# Patient Record
Sex: Male | Born: 1965 | Race: White | Hispanic: No | Smoking: Never smoker
Health system: Southern US, Community
[De-identification: ages and names within clinical notes are randomized; demographics above are authoritative.]

## PROBLEM LIST (undated history)

## (undated) DIAGNOSIS — I10 Essential (primary) hypertension: Secondary | ICD-10-CM

## (undated) DIAGNOSIS — E222 Syndrome of inappropriate secretion of antidiuretic hormone: Secondary | ICD-10-CM

## (undated) DIAGNOSIS — E871 Hypo-osmolality and hyponatremia: Secondary | ICD-10-CM

## (undated) DIAGNOSIS — F32A Depression, unspecified: Secondary | ICD-10-CM

## (undated) DIAGNOSIS — I2510 Atherosclerotic heart disease of native coronary artery without angina pectoris: Secondary | ICD-10-CM

## (undated) DIAGNOSIS — I2699 Other pulmonary embolism without acute cor pulmonale: Secondary | ICD-10-CM

## (undated) DIAGNOSIS — R51 Headache: Secondary | ICD-10-CM

## (undated) DIAGNOSIS — G2581 Restless legs syndrome: Secondary | ICD-10-CM

## (undated) DIAGNOSIS — R569 Unspecified convulsions: Secondary | ICD-10-CM

## (undated) DIAGNOSIS — F419 Anxiety disorder, unspecified: Secondary | ICD-10-CM

## (undated) DIAGNOSIS — Q030 Malformations of aqueduct of Sylvius: Secondary | ICD-10-CM

## (undated) DIAGNOSIS — G935 Compression of brain: Secondary | ICD-10-CM

## (undated) DIAGNOSIS — G5600 Carpal tunnel syndrome, unspecified upper limb: Secondary | ICD-10-CM

## (undated) DIAGNOSIS — N200 Calculus of kidney: Secondary | ICD-10-CM

## (undated) DIAGNOSIS — F329 Major depressive disorder, single episode, unspecified: Secondary | ICD-10-CM

## (undated) DIAGNOSIS — G4733 Obstructive sleep apnea (adult) (pediatric): Secondary | ICD-10-CM

## (undated) DIAGNOSIS — I4891 Unspecified atrial fibrillation: Secondary | ICD-10-CM

## (undated) DIAGNOSIS — R55 Syncope and collapse: Secondary | ICD-10-CM

## (undated) DIAGNOSIS — R413 Other amnesia: Secondary | ICD-10-CM

## (undated) DIAGNOSIS — G919 Hydrocephalus, unspecified: Secondary | ICD-10-CM

## (undated) HISTORY — DX: Obstructive sleep apnea (adult) (pediatric): G47.33

## (undated) HISTORY — DX: Anxiety disorder, unspecified: F41.9

## (undated) HISTORY — DX: Compression of brain: G93.5

## (undated) HISTORY — DX: Unspecified convulsions: R56.9

## (undated) HISTORY — DX: Hypo-osmolality and hyponatremia: E87.1

## (undated) HISTORY — DX: Unspecified atrial fibrillation: I48.91

## (undated) HISTORY — DX: Depression, unspecified: F32.A

## (undated) HISTORY — DX: Other pulmonary embolism without acute cor pulmonale: I26.99

## (undated) HISTORY — PX: CHOLECYSTECTOMY: SHX55

## (undated) HISTORY — DX: Hydrocephalus, unspecified: G91.9

## (undated) HISTORY — DX: Headache: R51

## (undated) HISTORY — DX: Essential (primary) hypertension: I10

## (undated) HISTORY — DX: Major depressive disorder, single episode, unspecified: F32.9

## (undated) HISTORY — PX: OTHER SURGICAL HISTORY: SHX169

## (undated) HISTORY — PX: PROSTATE SURGERY: SHX751

## (undated) HISTORY — PX: VENTRICULOPERITONEAL SHUNT: SHX204

## (undated) HISTORY — DX: Malformations of aqueduct of Sylvius: Q03.0

## (undated) HISTORY — DX: Restless legs syndrome: G25.81

## (undated) HISTORY — DX: Syncope and collapse: R55

## (undated) HISTORY — DX: Carpal tunnel syndrome, unspecified upper limb: G56.00

## (undated) HISTORY — DX: Other amnesia: R41.3

## (undated) HISTORY — DX: Syndrome of inappropriate secretion of antidiuretic hormone: E22.2

## (undated) HISTORY — DX: Calculus of kidney: N20.0

---

## 2006-08-17 ENCOUNTER — Encounter: Admission: RE | Admit: 2006-08-17 | Discharge: 2006-08-17 | Payer: Self-pay | Admitting: Neurology

## 2006-12-04 ENCOUNTER — Inpatient Hospital Stay (HOSPITAL_COMMUNITY): Admission: RE | Admit: 2006-12-04 | Discharge: 2006-12-14 | Payer: Self-pay | Admitting: Neurosurgery

## 2006-12-04 ENCOUNTER — Ambulatory Visit: Payer: Self-pay | Admitting: Critical Care Medicine

## 2006-12-10 ENCOUNTER — Ambulatory Visit: Payer: Self-pay | Admitting: *Deleted

## 2006-12-10 ENCOUNTER — Encounter: Payer: Self-pay | Admitting: Critical Care Medicine

## 2006-12-14 ENCOUNTER — Encounter (INDEPENDENT_AMBULATORY_CARE_PROVIDER_SITE_OTHER): Payer: Self-pay | Admitting: Neurosurgery

## 2006-12-30 ENCOUNTER — Inpatient Hospital Stay (HOSPITAL_COMMUNITY): Admission: EM | Admit: 2006-12-30 | Discharge: 2007-01-23 | Payer: Self-pay | Admitting: Emergency Medicine

## 2006-12-31 ENCOUNTER — Ambulatory Visit: Payer: Self-pay | Admitting: Infectious Disease

## 2007-02-04 ENCOUNTER — Ambulatory Visit (HOSPITAL_COMMUNITY): Admission: RE | Admit: 2007-02-04 | Discharge: 2007-02-04 | Payer: Self-pay | Admitting: Endocrinology

## 2007-03-07 ENCOUNTER — Encounter: Admission: RE | Admit: 2007-03-07 | Discharge: 2007-03-07 | Payer: Self-pay | Admitting: Neurosurgery

## 2007-04-25 ENCOUNTER — Encounter: Admission: RE | Admit: 2007-04-25 | Discharge: 2007-04-25 | Payer: Self-pay | Admitting: Neurosurgery

## 2007-07-02 ENCOUNTER — Emergency Department (HOSPITAL_COMMUNITY): Admission: EM | Admit: 2007-07-02 | Discharge: 2007-07-02 | Payer: Self-pay | Admitting: Emergency Medicine

## 2007-10-21 ENCOUNTER — Emergency Department (HOSPITAL_COMMUNITY): Admission: EM | Admit: 2007-10-21 | Discharge: 2007-10-21 | Payer: Self-pay | Admitting: Emergency Medicine

## 2008-02-14 ENCOUNTER — Encounter: Admission: RE | Admit: 2008-02-14 | Discharge: 2008-02-14 | Payer: Self-pay | Admitting: Neurology

## 2008-07-14 ENCOUNTER — Ambulatory Visit: Payer: Self-pay | Admitting: Cardiology

## 2008-07-14 ENCOUNTER — Inpatient Hospital Stay (HOSPITAL_COMMUNITY): Admission: AD | Admit: 2008-07-14 | Discharge: 2008-07-16 | Payer: Self-pay | Admitting: Internal Medicine

## 2008-07-14 ENCOUNTER — Encounter (INDEPENDENT_AMBULATORY_CARE_PROVIDER_SITE_OTHER): Payer: Self-pay | Admitting: Internal Medicine

## 2008-07-15 ENCOUNTER — Encounter: Payer: Self-pay | Admitting: Internal Medicine

## 2008-07-15 ENCOUNTER — Encounter (INDEPENDENT_AMBULATORY_CARE_PROVIDER_SITE_OTHER): Payer: Self-pay | Admitting: Internal Medicine

## 2008-09-02 ENCOUNTER — Ambulatory Visit (HOSPITAL_COMMUNITY): Admission: RE | Admit: 2008-09-02 | Discharge: 2008-09-02 | Payer: Self-pay | Admitting: Neurosurgery

## 2008-11-01 ENCOUNTER — Inpatient Hospital Stay (HOSPITAL_COMMUNITY): Admission: EM | Admit: 2008-11-01 | Discharge: 2008-11-05 | Payer: Self-pay | Admitting: Emergency Medicine

## 2008-11-01 ENCOUNTER — Ambulatory Visit: Payer: Self-pay | Admitting: Internal Medicine

## 2008-11-04 ENCOUNTER — Encounter: Payer: Self-pay | Admitting: Internal Medicine

## 2008-11-09 ENCOUNTER — Encounter: Payer: Self-pay | Admitting: Internal Medicine

## 2008-11-17 ENCOUNTER — Telehealth: Payer: Self-pay | Admitting: Internal Medicine

## 2008-11-17 ENCOUNTER — Emergency Department (HOSPITAL_COMMUNITY): Admission: EM | Admit: 2008-11-17 | Discharge: 2008-11-17 | Payer: Self-pay | Admitting: Emergency Medicine

## 2008-11-17 ENCOUNTER — Encounter: Payer: Self-pay | Admitting: Internal Medicine

## 2008-11-18 ENCOUNTER — Telehealth: Payer: Self-pay | Admitting: Internal Medicine

## 2008-11-19 ENCOUNTER — Ambulatory Visit: Payer: Self-pay

## 2008-11-19 ENCOUNTER — Encounter: Payer: Self-pay | Admitting: Internal Medicine

## 2009-02-22 ENCOUNTER — Ambulatory Visit: Payer: Self-pay | Admitting: Internal Medicine

## 2009-04-29 ENCOUNTER — Ambulatory Visit: Payer: Self-pay | Admitting: Internal Medicine

## 2009-04-29 DIAGNOSIS — R42 Dizziness and giddiness: Secondary | ICD-10-CM | POA: Insufficient documentation

## 2009-04-29 DIAGNOSIS — R55 Syncope and collapse: Secondary | ICD-10-CM

## 2009-04-29 DIAGNOSIS — G9001 Carotid sinus syncope: Secondary | ICD-10-CM

## 2009-04-29 DIAGNOSIS — I4891 Unspecified atrial fibrillation: Secondary | ICD-10-CM

## 2009-04-29 DIAGNOSIS — Q078 Other specified congenital malformations of nervous system: Secondary | ICD-10-CM

## 2009-05-12 IMAGING — CR DG CHEST 1V PORT
1 series · 1 of 1 positions shown · non-contrast
Comparison: [DATE]/airway at 12: 12 hours

CLINICAL DATA: Hydrocephalus. Line placement.

PORTABLE CHEST - 1 VIEW

[view not recorded]
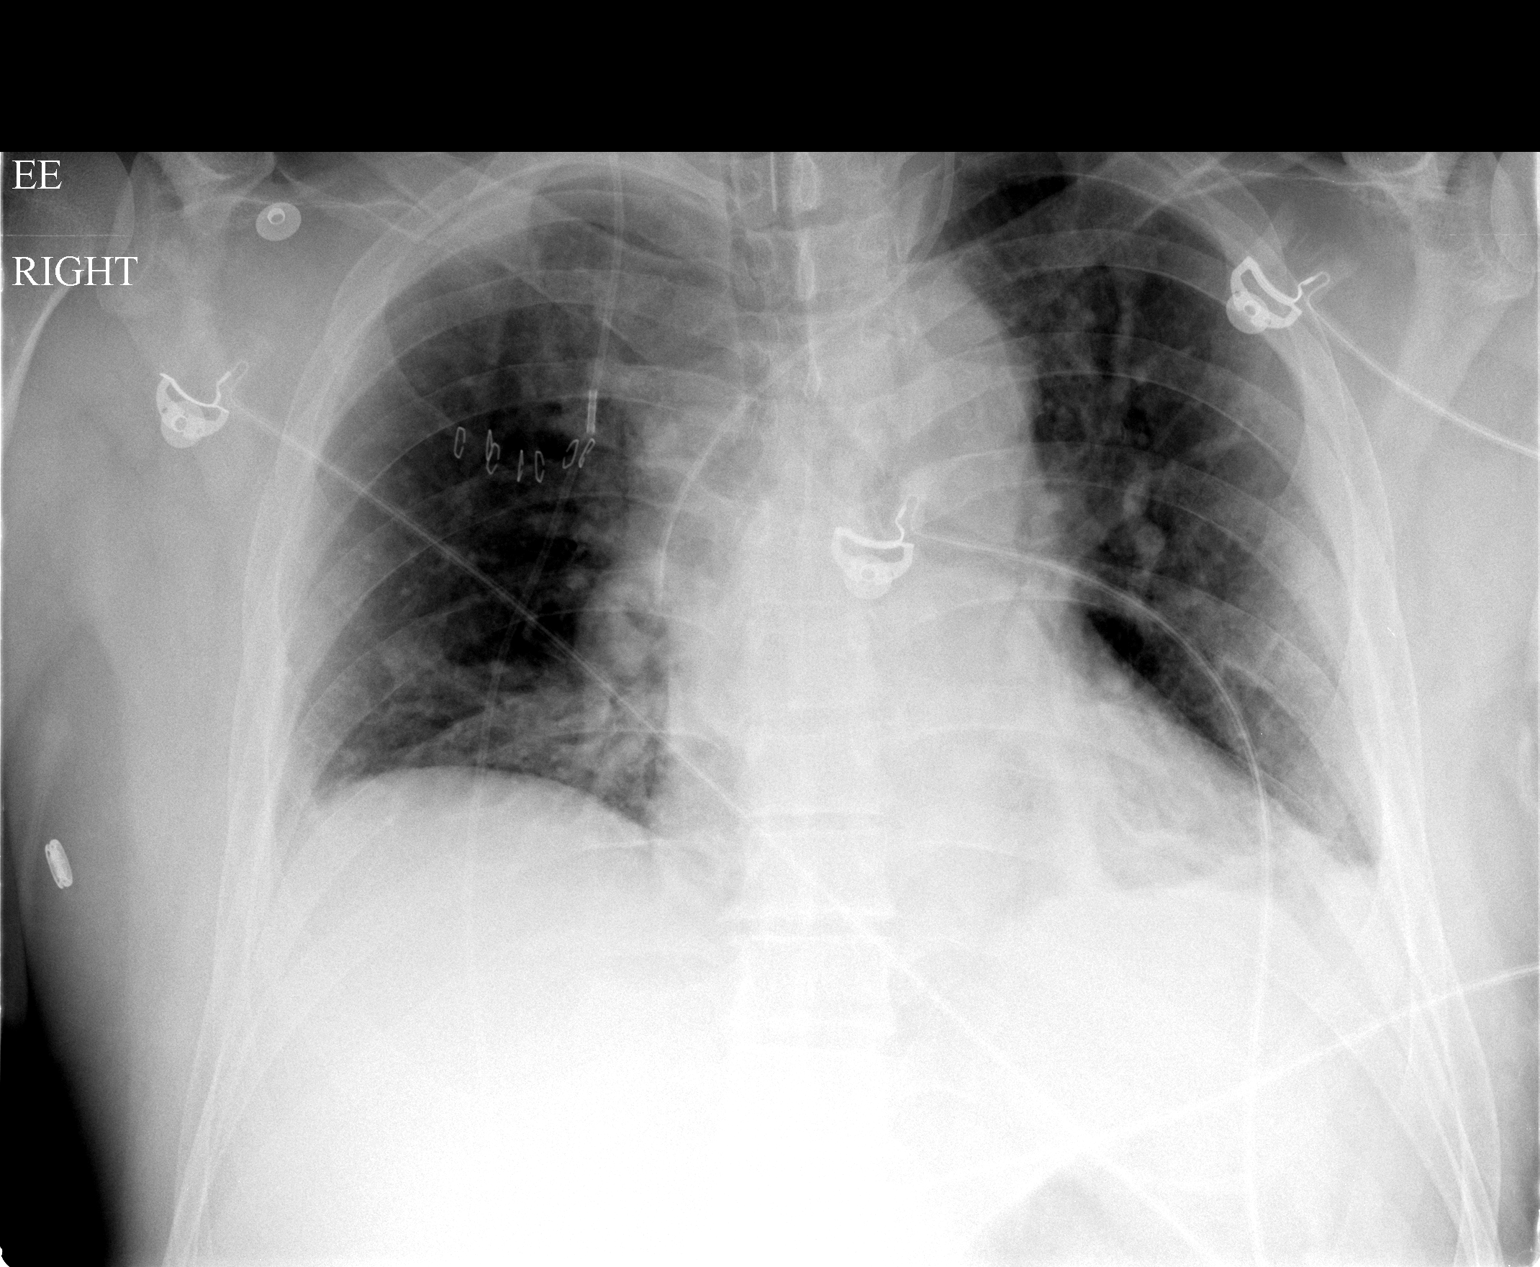

[1 of 1 positions shown; findings below may reference images not displayed]

FINDINGS: Left internal jugular central venous catheter tip projects into the
superior vena cava. No pneumothorax identified. New skin clips project over the
right hemithorax, and a vertical metallic structure projecting over the right
ventricular peritoneal shunt tubing may represent an adapter.

Interstitial opacities stable bilaterally. Low lung volumes are present. There
are stable airspace opacity of left lung base. Endotracheal tube appear stable. 

IMPRESSION

1. Left IJ line tip: SVC. No pneumothorax.
2. Small adapter projects along the right thoracic VP shunt tubing.
3. The lungs appear stable.

## 2009-05-12 IMAGING — CR DG ABD PORTABLE 1V
1 series · 1 of 1 positions shown · non-contrast
Comparison: none

CLINICAL DATA: Distended abdomen status post ventricular peritoneal shunt. 
PORTABLE ABDOMEN - 1 VIEW - 12/07/06 AT 9338 HOURS:

[view not recorded]
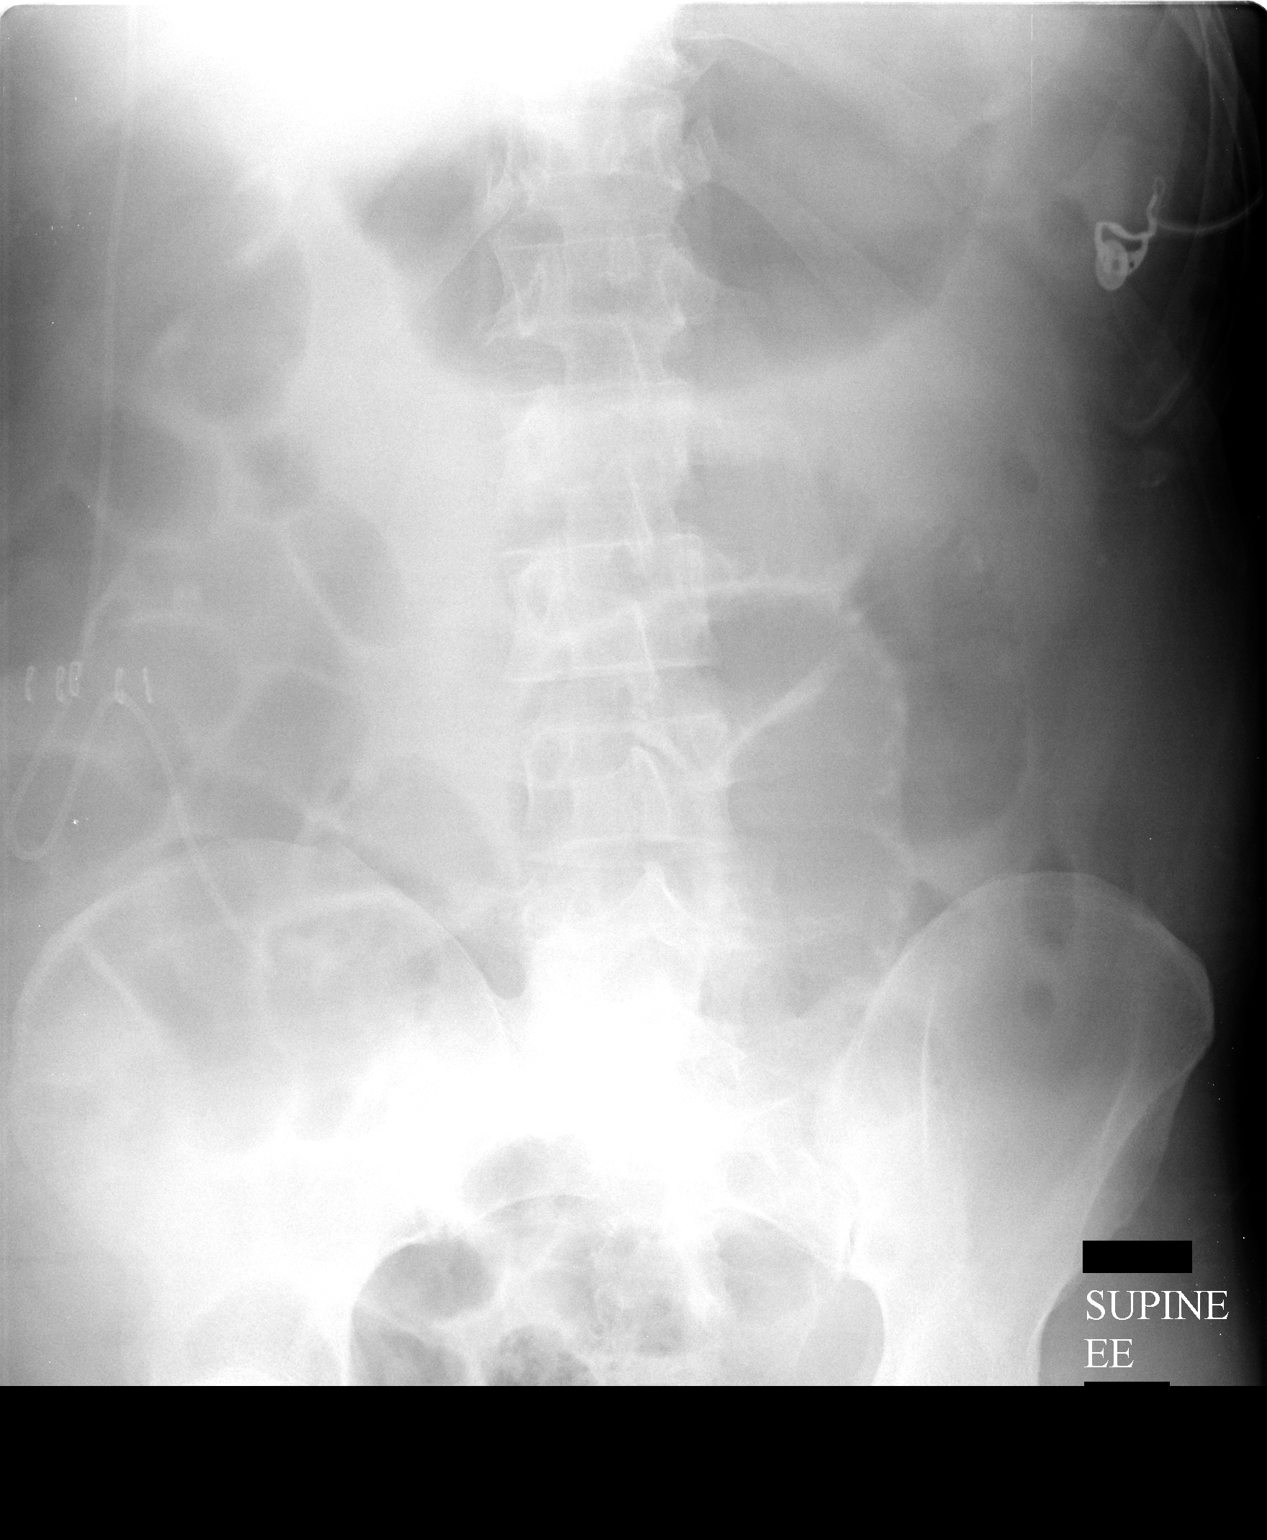

[1 of 1 positions shown; findings below may reference images not displayed]

FINDINGS: There is mild gaseous distention of large and small bowel compatible with ileus.  entricular peritoneal shunt tubing is seen overlying the right abdomen extending to the tip of the catheter overlying the right iliac bone.  No acute bony abnormality.
IMPRESSION: Mild ileus.

## 2009-05-15 IMAGING — CT CT HEAD W/O CM
1 series · 15 of 30 positions shown, 19 images · IV contrast (agent unspecified)
Comparison: 12/07/06 and earlier.

CLINICAL DATA: 40 year-old-male with balance and gait difficulties.  Right ventriculoperitoneal shunt placement due to obstructive hydrocephalus.  
 HEAD CT WITHOUT CONTRAST:
TECHNIQUE: Contiguous axial images were obtained from the base of the skull through the vertex according to standard protocol without contrast.

[Series 2: head routine 4.8 h47s · axial · 0.51mm/px · z∈[-90,+47]mm · 15 of 30 slices shown, 19 images]
[im 2/30  brain]
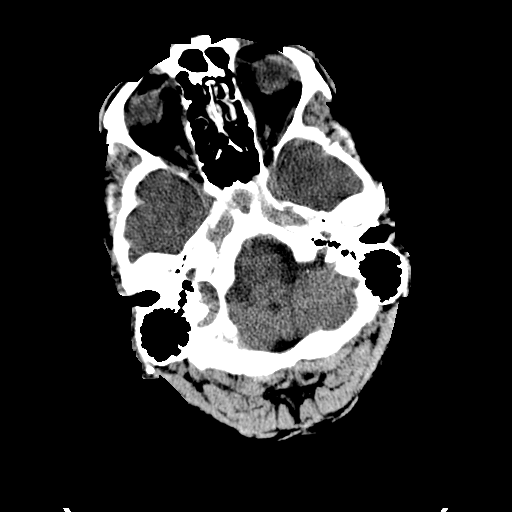
[im 2/30  bone]
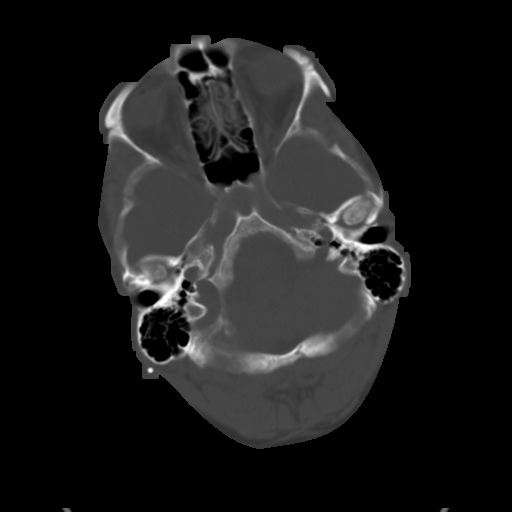
[im 4/30  brain]
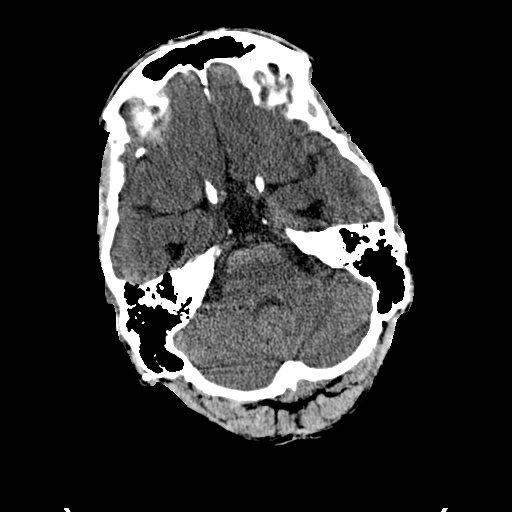
[im 6/30  brain]
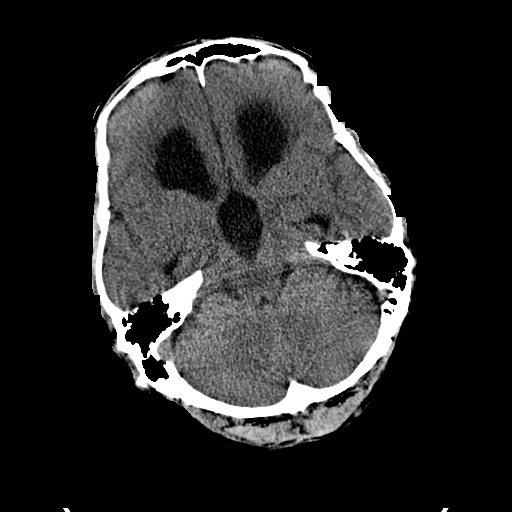
[im 8/30  brain]
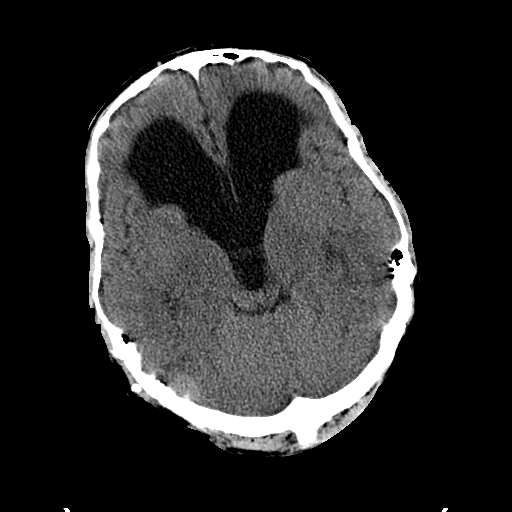
[im 10/30  brain]
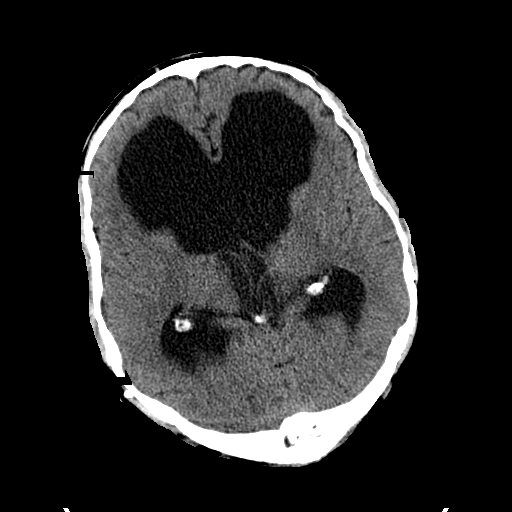
[im 10/30  bone]
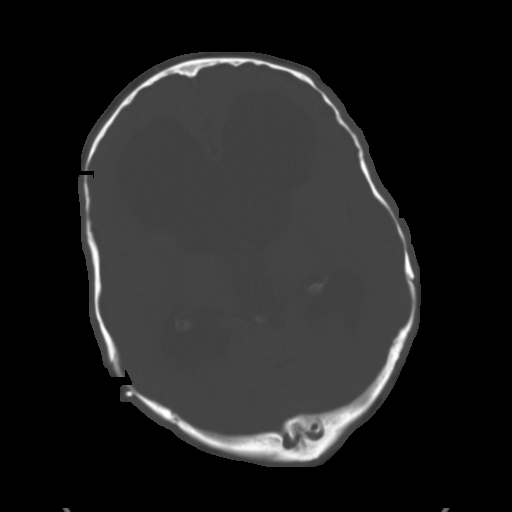
[im 12/30  brain]
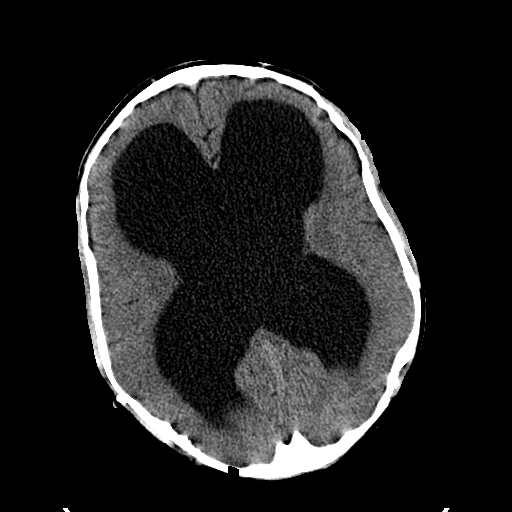
[im 14/30  brain]
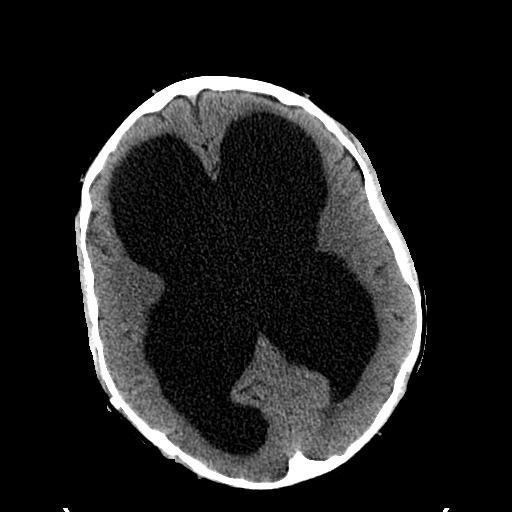
[im 16/30  brain]
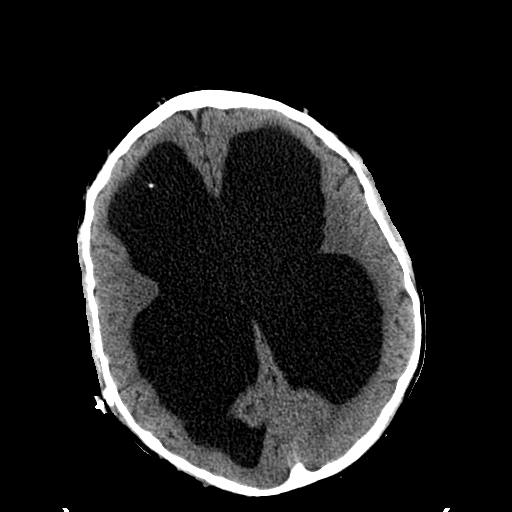
[im 17/30  brain]
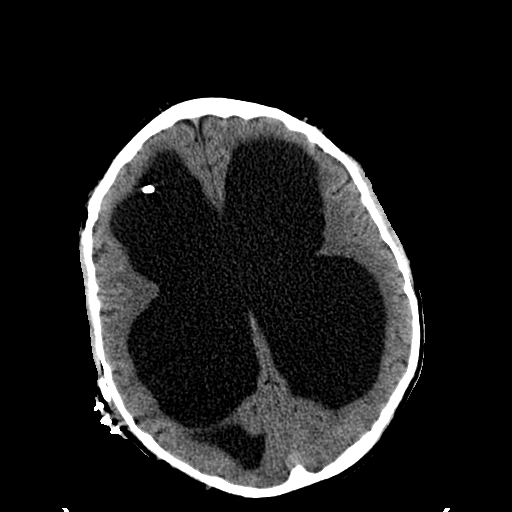
[im 17/30  bone]
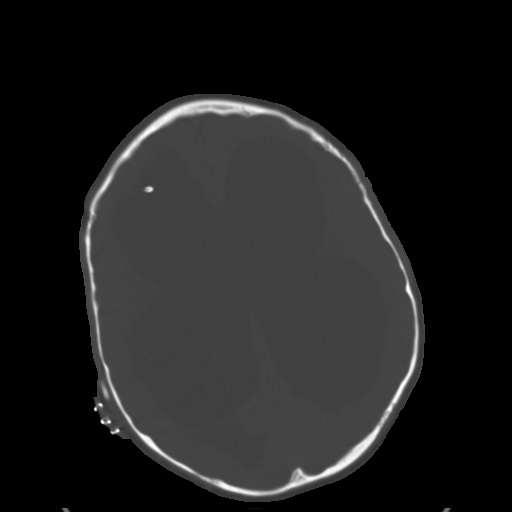
[im 19/30  brain]
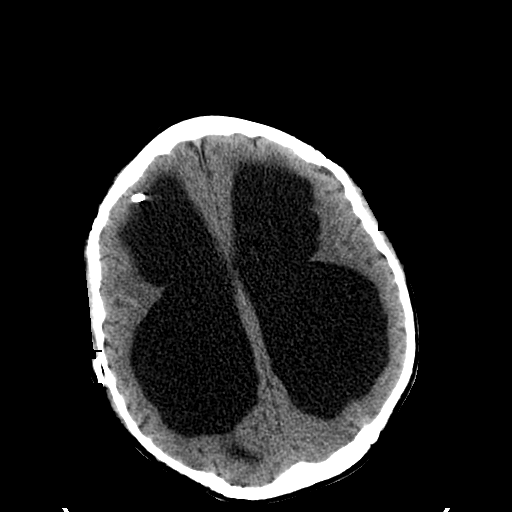
[im 21/30  brain]
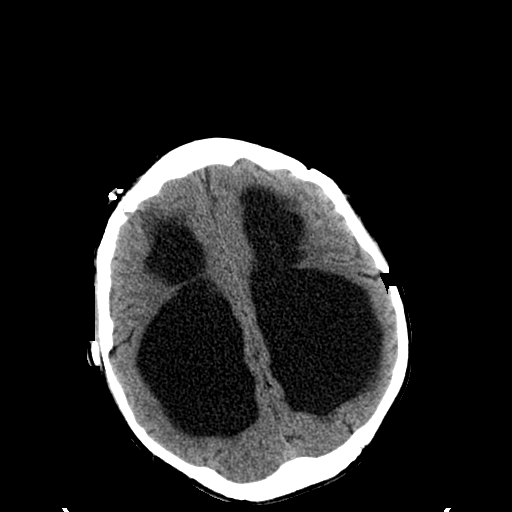
[im 23/30  brain]
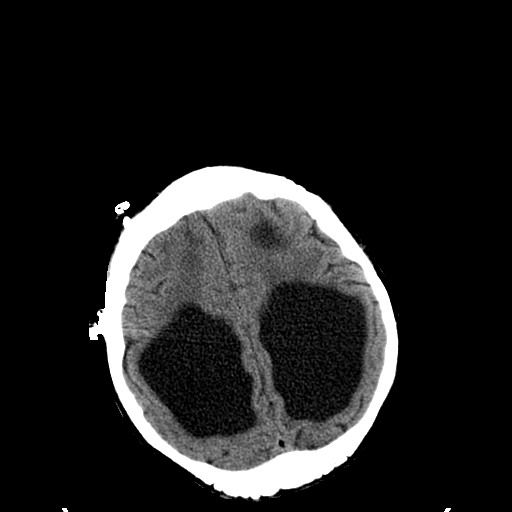
[im 25/30  brain]
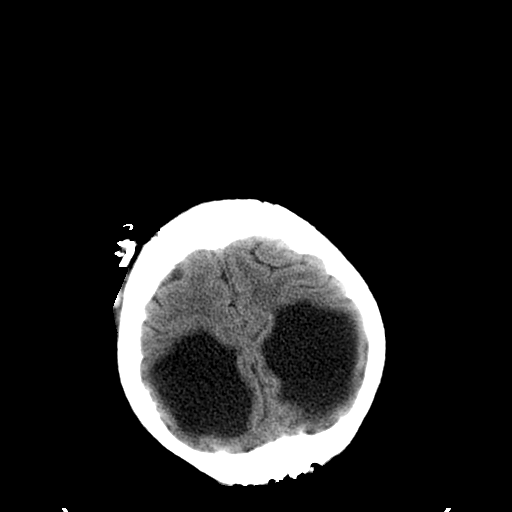
[im 25/30  bone]
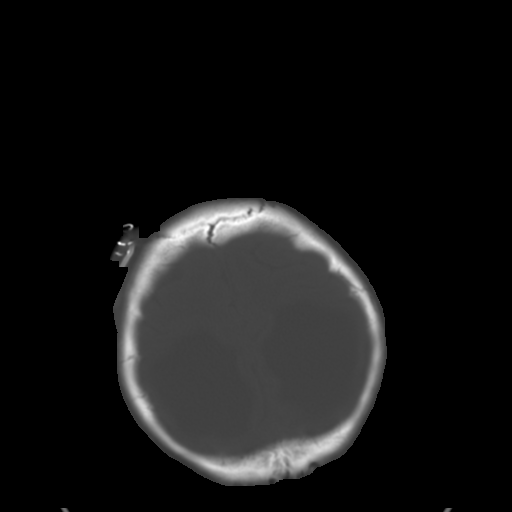
[im 27/30  brain]
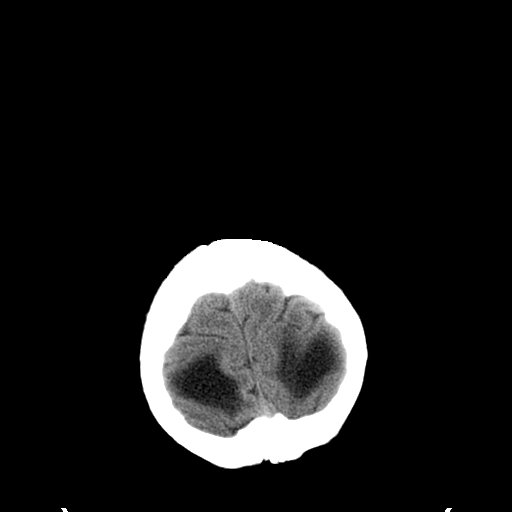
[im 29/30  brain]
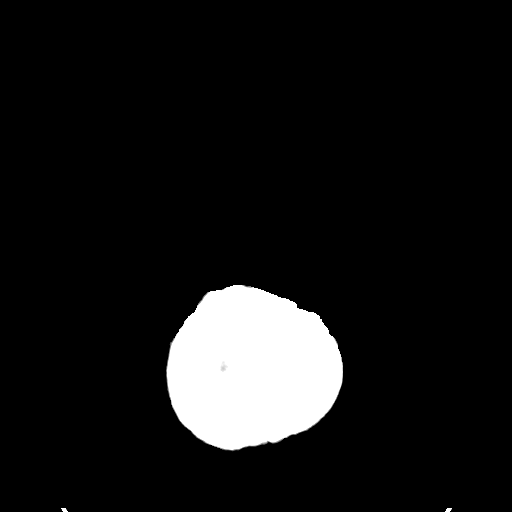

[15 of 30 positions shown; findings below may reference images not displayed]

FINDINGS: Marked enlargement of the lateral and 3rd ventricles relative to the 4th ventricle is reidentified.  A ventriculostomy catheter is reidentified extending from the right frontal approach into the right anterior horn.   There is decreased gas within the ventricles.  Overall ventricle size and configuration are stable.  No midline shift or acute intracranial hemorrhage.  Basilar cisterns are patent.  Stable gray/white matter differentiation.  Stable crowding of the foramen magnum.  Skin staples of the right frontal scalp remain in place.   The visualized orbits are normal.   No acute osseous, mastoid or paranasal sinus abnormality.
IMPRESSION: 1.  Stable lateral and 3rd ventriculomegaly.  Stable right frontal ventriculostomy shunt.  
 2.  No acute intracranial abnormality.

## 2009-05-16 ENCOUNTER — Emergency Department (HOSPITAL_COMMUNITY): Admission: EM | Admit: 2009-05-16 | Discharge: 2009-05-16 | Payer: Self-pay | Admitting: Emergency Medicine

## 2009-07-12 ENCOUNTER — Ambulatory Visit: Payer: Self-pay | Admitting: Internal Medicine

## 2009-08-18 ENCOUNTER — Encounter: Admission: RE | Admit: 2009-08-18 | Discharge: 2009-08-18 | Payer: Self-pay | Admitting: Neurology

## 2009-08-23 ENCOUNTER — Encounter: Admission: RE | Admit: 2009-08-23 | Discharge: 2009-08-23 | Payer: Self-pay | Admitting: Neurology

## 2009-09-17 ENCOUNTER — Encounter: Payer: Self-pay | Admitting: Internal Medicine

## 2009-09-17 ENCOUNTER — Ambulatory Visit: Payer: Self-pay | Admitting: Cardiovascular Disease

## 2009-11-05 ENCOUNTER — Encounter: Admission: RE | Admit: 2009-11-05 | Discharge: 2009-11-05 | Payer: Self-pay | Admitting: Neurosurgery

## 2009-11-11 ENCOUNTER — Ambulatory Visit (HOSPITAL_COMMUNITY): Admission: RE | Admit: 2009-11-11 | Discharge: 2009-11-11 | Payer: Self-pay | Admitting: Neurosurgery

## 2009-11-22 ENCOUNTER — Encounter: Admission: RE | Admit: 2009-11-22 | Discharge: 2009-11-22 | Payer: Self-pay | Admitting: Neurosurgery

## 2009-11-26 ENCOUNTER — Inpatient Hospital Stay (HOSPITAL_COMMUNITY): Admission: RE | Admit: 2009-11-26 | Discharge: 2009-11-27 | Payer: Self-pay | Admitting: Neurosurgery

## 2009-12-10 ENCOUNTER — Encounter: Payer: Self-pay | Admitting: Internal Medicine

## 2009-12-13 ENCOUNTER — Encounter: Admission: RE | Admit: 2009-12-13 | Discharge: 2009-12-13 | Payer: Self-pay | Admitting: Neurosurgery

## 2010-02-18 ENCOUNTER — Encounter
Admission: RE | Admit: 2010-02-18 | Discharge: 2010-02-18 | Payer: Self-pay | Source: Home / Self Care | Attending: Neurosurgery | Admitting: Neurosurgery

## 2010-02-23 NOTE — Letter (Signed)
Summary: Return To Work  [name]  [address]   [place], [place] [address]   Phone: [phone]  Fax: [phone]    09/17/2009  TO: Leodis Sias IT MAY CONCERN   RE: [name] [address] [id number]   The above named individual is under my medical care and may return to work on:09/18/2009.  He is not to climb any ladders or work at any high levels.  If you have any further questions or need additional information, please call.     Sincerely,  Altha Harm, LPN  September 17, 2009 3:17 PM   Duke Salvia, MD,FACC

## 2010-02-23 NOTE — Procedures (Signed)
Summary: [facility]iology Device [facility]   Allergies: 1)  ! Prednisone   ILR Following MD Sherryl Manges, MD DOI:  11/04/2008 Vendor:  St Jude     Model Number:  [id number]     Serial Number [id number]       Tachy Episodes:  1     Brady Episodes:  1  Tech Comments:  All episodes were either noise or undersensing.  EGM dynamic range reprogrammed 0.4mV and sensitivity reprogrammed 0.6mV.  ROV 3 months [facility]. Altha Harm, LPN  July 12, 2009 11:19 AM

## 2010-02-23 NOTE — Procedures (Signed)
Summary: Cardiology Device Clinic   Allergies: 1)  ! Prednisone   ILR Following MD Sherryl Manges, MD DOI:  11/04/2008 Vendor:  [employer] Jude     Model Number:  [id number]     Serial Number [id number]       Tachy Episodes:  1     Brady Episodes:  1  Tech Comments:  All episodes either noise or undersensing including the 63 asystole episodes, all of these episodes were from 8/18-8/26.     He did have a syncopal episode on 08/23/2009 but did not acitivate the device to record. Altha Harm, LPN  September 17, 2009 11:34 AM

## 2010-02-23 NOTE — Procedures (Signed)
Summary: Holter and Event  Holter and Event   Imported By: Erle Crocker 12/10/2009 16:05:17  _____________________________________________________________________  External Attachment:    Type:   Image     Comment:   External Document

## 2010-02-23 NOTE — Procedures (Signed)
Summary: Cardiology Device Clinic    ILR Following MD Sherryl Manges, MD DOI:  11/04/2008 Vendor:  St Jude     Model Number:  [id number]     Serial Number [id number]       ILR Next Due 04/23/2009  Tech Comments:  Recorded episodes are undersensing and noise.   The patient has been asymptomatic.  ROV 3 months. Altha Harm, LPN  February 22, 2009 3:01 PM

## 2010-02-23 NOTE — Assessment & Plan Note (Signed)
Summary: rov   CC:  rov/Tuesday he felt very weak and has been nauseated since Sunday.  SOB reported with the nausea.  Pt states that he has also had some headaches this week.  .  History of Present Illness: 1. 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 2. Status post loop recorder for the above.   [name] is seen in followup for syncope that is thought to be neurally mediated. He has a history of paroxysmal atrial fibrillation. He is status post loop recorder implantation.  He is activated his monitor on one occasion for palpitations. This demonstrated atrial fibrillation with a rapid ventricular response.  He comes in today however as an add-on, because over the weekend he began having problems with nausea. On Tuesday he became increasingly weak lightheaded and presyncopal. His nausea persists but is largely abated. As lightheadedness and weakness is also improved. I should note that he is status post cholecystectomy.  Because of his hydrocephalus, he has very poor short-term memory.   Current Medications (verified): 1)  Androgel Pump 1 % Gel (Testosterone) .... Uad 2)  Sodium Chloride 1 Gm Tabs (Sodium Chloride) .... Take 3 Tablets Three Times A Day 3)  Fludrocortisone Acetate 0.1 Mg Tabs (Fludrocortisone Acetate) .... 2 Tablets Two Times A Day 4)  Zoloft 100 Mg Tabs (Sertraline Hcl) .... Take One Tablet Once Daily 5)  Lamotrigine 100 Mg Tabs (Lamotrigine) .... Take 2 Tablets Two Times A Day 6)  Aspirin 81 Mg Tabs (Aspirin) .... Take One Tablet Once Daily  Allergies (verified): 1)  ! 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 Jude DM 2112100 loop Hydrocephalus Seizures Chiari I malformation A-Fib Cardiomyopathy Pulmonary embolism Salt-wasting syndrome SIADH  Past Surgical History: Last updated: 04/29/2009 St. Jude DM [id number]  loop VP shunt Cholecystectomy  Family History: Last updated: 04/29/2009 Noncontributory  Social History: Last updated: 04/29/2009 Tobacco Use - No.  Alcohol Use - no  Vital Signs:  Patient profile:   45 year old male Height:      78 inches Weight:      287 pounds BMI:     33 .29 Pulse rate:   63 / minute Pulse rhythm:   regular BP sitting:   130 / 79  (left arm) Cuff size:   large  Vitals Entered By: Judithe Modest CMA (April 29, 2009 4:17 PM)  Physical Exam  General:  The patient was alert and oriented in no acute distress. HEENT Normal.  Neck veins were flat, carotids were brisk.  Lungs were clear.  Heart sounds were regular without murmurs or gallops.  Abdomen was soft with active bowel sounds. There is no clubbing cyanosis or edema. Skin Warm and dry neuro is notable for decreased short-term memory. He is alert and oriented however    EKG  Procedure date:  04/29/2009  Findings:      sinus rhythm at 63 Intervals 0.17/0.09/0.41 Axis is zero excellent otherwise normal   ILR Following MD Sherryl Manges, MD DOI:  11/04/2008 Vendor:  St Jude     Model Number:  [id number]     Serial Number [id number]        Impression & Recommendations:  Problem # 1:  DYSAUTONOMIA (ICD-742.8) the patient has presumed dysautonomia. This would then be consistent with his history of orthostatic intolerance in the setting of nausea as a potential trigger and/or exacerbating factor. It is possible that the  nausea is part of the dysautonomia process or a primary issue. I have suggested since it has largely abated at this time were to recur that he should see Dr. Cliffton Asters in [place] for evaluation of possible GI causes  Problem # 2:  NAUSEA (ICD-787.02) as above  Problem # 3:  ATRIAL FIBRILLATION (ICD-427.31) atrial fibrillation was identified on his loop recorder. It was symptomatic. He does not however recall the associated symptoms. His updated medication list for this problem  includes:    Aspirin 81 Mg Tabs (Aspirin) .Marland Kitchen... Take one tablet once daily  Orders: EKG w/ Interpretation (93000)  Problem # 4:  LOOP RECORDER (ICD-V45.09) as above. There are multiple inappropriate detections.  Patient Instructions: 1)  Your physician recommends that you keep your follow-up appointment that is already scheduled for : 06/04/09.

## 2010-04-05 LAB — BASIC METABOLIC PANEL
BUN: 8 mg/dL (ref 6–23)
CO2: 27 mEq/L (ref 19–32)
Chloride: 99 mEq/L (ref 96–112)
Creatinine, Ser: 0.97 mg/dL (ref 0.4–1.5)
GFR calc Af Amer: >60 mL/min (ref >60)
Potassium: 4.5 mEq/L (ref 3.5–5.1)

## 2010-04-05 LAB — COMPREHENSIVE METABOLIC PANEL
ALT: 46 U/L (ref 0–53)
AST: 45 U/L — ABNORMAL HIGH (ref 0–37)
AST: 62 U/L — ABNORMAL HIGH (ref 0–37)
Albumin: 3.4 g/dL — ABNORMAL LOW (ref 3.5–5.2)
Alkaline Phosphatase: 87 U/L (ref 39–117)
BUN: 9 mg/dL (ref 6–23)
CO2: 27 mEq/L (ref 19–32)
Calcium: 8.8 mg/dL (ref 8.4–10.5)
Chloride: 100 mEq/L (ref 96–112)
Chloride: 100 mEq/L (ref 96–112)
Creatinine, Ser: 0.9 mg/dL (ref 0.4–1.5)
GFR calc Af Amer: >60 mL/min (ref >60)
GFR calc Af Amer: >60 mL/min (ref >60)
GFR calc non Af Amer: >60 mL/min (ref >60)
Glucose, Bld: 97 mg/dL (ref 70–99)
Sodium: 132 mEq/L — ABNORMAL LOW (ref 135–145)
Total Bilirubin: 1.5 mg/dL — ABNORMAL HIGH (ref 0.3–1.2)
Total Bilirubin: 2.1 mg/dL — ABNORMAL HIGH (ref 0.3–1.2)
Total Protein: 6.3 g/dL (ref 6.0–8.3)

## 2010-04-05 LAB — CBC
HCT: 43.2 % (ref 39.0–52.0)
MCH: 27.2 pg (ref 26.0–34.0)
MCV: 80.6 fL (ref 78.0–100.0)
Platelets: 344 10*3/uL (ref 150–400)
RBC: 5.36 MIL/uL (ref 4.22–5.81)
WBC: 10.3 10*3/uL (ref 4.0–10.5)

## 2010-04-06 LAB — CSF CELL COUNT WITH DIFFERENTIAL
Eosinophils, CSF: 0 % (ref 0–1)
WBC, CSF: 2 /mm3 (ref 0–5)

## 2010-04-06 LAB — PROTEIN AND GLUCOSE, CSF: Total  Protein, CSF: 14 mg/dL — ABNORMAL LOW (ref 15–45)

## 2010-04-12 LAB — BASIC METABOLIC PANEL
Calcium: 9.4 mg/dL (ref 8.4–10.5)
Chloride: 89 mEq/L — ABNORMAL LOW (ref 96–112)
Creatinine, Ser: 0.82 mg/dL (ref 0.4–1.5)
GFR calc Af Amer: >60 mL/min (ref >60)
Sodium: 125 mEq/L — ABNORMAL LOW (ref 135–145)

## 2010-04-12 LAB — POCT CARDIAC MARKERS
CKMB, poc: 6.6 ng/mL (ref 1.0–8.0)
Myoglobin, poc: 234 ng/mL (ref 12–200)
Troponin i, poc: <0.05 ng/mL (ref 0.00–0.09)

## 2010-04-12 LAB — URINALYSIS, ROUTINE W REFLEX MICROSCOPIC
Glucose, UA: NEGATIVE mg/dL
Hgb urine dipstick: NEGATIVE
Specific Gravity, Urine: 1.003 — ABNORMAL LOW (ref 1.005–1.030)

## 2010-04-12 LAB — CBC
Hemoglobin: 15 g/dL (ref 13.0–17.0)
MCV: 86.1 fL (ref 78.0–100.0)
RBC: 5.05 MIL/uL (ref 4.22–5.81)
WBC: 12.6 10*3/uL — ABNORMAL HIGH (ref 4.0–10.5)

## 2010-04-12 LAB — DIFFERENTIAL
Lymphs Abs: 3.3 10*3/uL (ref 0.7–4.0)
Monocytes Absolute: 1.4 10*3/uL — ABNORMAL HIGH (ref 0.1–1.0)
Monocytes Relative: 12 % (ref 3–12)
Neutro Abs: 7.7 10*3/uL (ref 1.7–7.7)
Neutrophils Relative %: 61 % (ref 43–77)

## 2010-04-28 LAB — CBC
HCT: 43.2 % (ref 39.0–52.0)
HCT: 44.4 % (ref 39.0–52.0)
Hemoglobin: 14.9 g/dL (ref 13.0–17.0)
Hemoglobin: 15.1 g/dL (ref 13.0–17.0)
Hemoglobin: 15.9 g/dL (ref 13.0–17.0)
MCHC: 34 g/dL (ref 30.0–36.0)
MCHC: 33.5 g/dL (ref 30.0–36.0)
MCHC: 34 g/dL (ref 30.0–36.0)
MCV: 87.1 fL (ref 78.0–100.0)
MCV: 87.1 fL (ref 78.0–100.0)
MCV: 87.4 fL (ref 78.0–100.0)
Platelets: 363 10*3/uL (ref 150–400)
RBC: 5.05 MIL/uL (ref 4.22–5.81)
RBC: 5.1 MIL/uL (ref 4.22–5.81)
RBC: 5.42 MIL/uL (ref 4.22–5.81)
RDW: 14.6 % (ref 11.5–15.5)
RDW: 14.4 % (ref 11.5–15.5)

## 2010-04-28 LAB — BASIC METABOLIC PANEL
CO2: 25 mEq/L (ref 19–32)
CO2: 26 mEq/L (ref 19–32)
Calcium: 8.7 mg/dL (ref 8.4–10.5)
Chloride: 97 mEq/L (ref 96–112)
Chloride: 99 mEq/L (ref 96–112)
Creatinine, Ser: 0.88 mg/dL (ref 0.4–1.5)
GFR calc Af Amer: >60 mL/min (ref >60)
GFR calc Af Amer: >60 mL/min (ref >60)
Glucose, Bld: 98 mg/dL (ref 70–99)
Sodium: 131 mEq/L — ABNORMAL LOW (ref 135–145)

## 2010-04-28 LAB — POCT I-STAT, CHEM 8
BUN: 12 mg/dL (ref 6–23)
Calcium, Ion: 1.16 mmol/L (ref 1.12–1.32)
TCO2: 28 mmol/L (ref 0–100)

## 2010-04-28 LAB — DIFFERENTIAL
Basophils Relative: 1 % (ref 0–1)
Eosinophils Absolute: 0 10*3/uL (ref 0.0–0.7)
Eosinophils Relative: 0 % (ref 0–5)
Monocytes Absolute: 0.7 10*3/uL (ref 0.1–1.0)
Monocytes Relative: 7 % (ref 3–12)
Neutrophils Relative %: 79 % — ABNORMAL HIGH (ref 43–77)

## 2010-04-28 LAB — URINALYSIS, ROUTINE W REFLEX MICROSCOPIC
Glucose, UA: NEGATIVE mg/dL
Hgb urine dipstick: NEGATIVE
Specific Gravity, Urine: 1.007 (ref 1.005–1.030)
pH: 7.5 (ref 5.0–8.0)

## 2010-04-28 LAB — POCT CARDIAC MARKERS

## 2010-04-28 LAB — PROTIME-INR
INR: 0.93 (ref 0.00–1.49)
Prothrombin Time: 12.4 seconds (ref 11.6–15.2)

## 2010-04-30 LAB — PROTEIN AND GLUCOSE, CSF: Total  Protein, CSF: 108 mg/dL — ABNORMAL HIGH (ref 15–45)

## 2010-04-30 LAB — CSF CELL COUNT WITH DIFFERENTIAL
RBC Count, CSF: 0 /mm3
Tube #: 1

## 2010-04-30 LAB — CSF CULTURE W GRAM STAIN

## 2010-05-02 LAB — URINALYSIS, ROUTINE W REFLEX MICROSCOPIC
Glucose, UA: NEGATIVE mg/dL
Ketones, ur: NEGATIVE mg/dL
Nitrite: NEGATIVE
Specific Gravity, Urine: 1.005 (ref 1.005–1.030)
pH: 5.5 (ref 5.0–8.0)

## 2010-05-02 LAB — CARDIAC PANEL(CRET KIN+CKTOT+MB+TROPI)
CK, MB: 4.4 ng/mL — ABNORMAL HIGH (ref 0.3–4.0)
CK, MB: 5.4 ng/mL — ABNORMAL HIGH (ref 0.3–4.0)
CK, MB: 7.5 ng/mL — ABNORMAL HIGH (ref 0.3–4.0)
Relative Index: 2.1 (ref 0.0–2.5)
Relative Index: 2.4 (ref 0.0–2.5)
Troponin I: 0.02 ng/mL (ref 0.00–0.06)
Troponin I: 0.01 ng/mL (ref 0.00–0.06)

## 2010-05-02 LAB — BASIC METABOLIC PANEL
BUN: 8 mg/dL (ref 6–23)
Chloride: 105 mEq/L (ref 96–112)
GFR calc non Af Amer: >60 mL/min (ref >60)
Glucose, Bld: 100 mg/dL — ABNORMAL HIGH (ref 70–99)
Potassium: 4.3 mEq/L (ref 3.5–5.1)
Sodium: 136 mEq/L (ref 135–145)

## 2010-05-02 LAB — COMPREHENSIVE METABOLIC PANEL
AST: 29 U/L (ref 0–37)
Albumin: 3.6 g/dL (ref 3.5–5.2)
BUN: 7 mg/dL (ref 6–23)
Creatinine, Ser: 0.79 mg/dL (ref 0.4–1.5)
GFR calc Af Amer: >60 mL/min (ref >60)
Potassium: 4 mEq/L (ref 3.5–5.1)
Total Protein: 6.6 g/dL (ref 6.0–8.3)

## 2010-06-07 NOTE — Assessment & Plan Note (Signed)
[facility] CARDIOLOGY OFFICE NOTE   Jaydeen, Odor [name]                    MRN:          [id number]  DATE:09/17/2009                            DOB:          09/23/1965    Mr. Stalzer is seen in followup for syncope which is presumed to be  neurally mediated.  He has an implanted loop recorder.  He had a  recurrent episode of syncope.  He did not have his activator with him  and there has been sufficient events to override distorted event.  This  episode was characteristic.  It was associated with a very short  prodrome so he could last.  He has significant diaphoresis and pallor  afterwards.   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  He also has hypertension for  which he is on amlodipine.   He also has significant nocturnal sleep disordered breathing, daytime  somnolence, and in the remote past, had a sleep study that was abnormal.  Attempts to hook him up with CPAP, not well tolerated.   He has recently been asked to refrain from going back to work until his  problems are resolved.   PHYSICAL EXAMINATION:  VITAL SIGNS:  Today, his weight is 305 pounds,  this is up 13 pounds in 2 months and 20 pounds in 7 months.  NECK:  His neck veins were flat.  LUNGS:  Clear.  HEART:  Sounds were regular.  ABDOMEN:  Soft.  EXTREMITIES:  Had no edema.   Interrogation of the loop recorder demonstrated no real events.  There  are multiple stored events which were all under sensing.   IMPRESSION:  1. Recurrent syncope, presumably neurally mediated.  2. Salt-wasting nephropathy.  3. Probable obstructive sleep apnea.  4. Hypertension.  5. Increasing weight gain.   Mr. Wyffels's recurrent syncope is consistent with his prior events.  It  probably is neurally mediated.  Unfortunately, he did not have his  activator with him and he is now coming in 3 weeks  later and the date  has been overwritten.  We have reviewed this extensively.   He has a salt-wasting nephropathy and associated hypertension.  We will  plan to discontinue his amlodipine and allow his blood pressure to rise  a little bit for right now.  There may be some role from [place] in  this situation.  Furthermore, with his symptoms that are consistent with  obstructive sleep apnea, he may well have a component of secondary  hypertension which we can treat non-medicinally.  We will undertake a  night wash study again with consideration for referral for therapy in  the event that is abnormal as I suspect it will be.   I am also bothered by this recent significant weight gain.  In the event  if this continues, have him follow up with Dr. Ruben Im for exploration  of metabolic causes for his weight gain.   We will see him again in 12 months' time.  He is advised to call in the  event that he  has recurrent syncope and we will follow up with him about  his night wash study.     Duke Salvia, MD, [name]  Electronically Signed    SCK/MedQ  DD: 09/17/2009  DT: 09/18/2009  Job #: [id number]   cc:   Arlan Organ

## 2010-06-07 NOTE — H&P (Signed)
NAMEBEVERLY, SURIANO NO.:  [id number]   MEDICAL RECORD NO.:  [id number]          PATIENT TYPE:  INP   LOCATION:  1238                         FACILITY:  [facility]   PHYSICIAN:  Zannie Cove, MD     DATE OF BIRTH:  [birth year]   DATE OF ADMISSION:  07/14/2008  DATE OF DISCHARGE:                              HISTORY & PHYSICAL   PRIMARY CARE PHYSICIAN:  Dr. Martha Clan from [place].   NEUROLOGIST:  Dr. Rana Snare.   NEUROSURGEON:  Dr. Jeral Fruit.   ENDOCRINOLOGIST:  Dr. Dagoberto Ligas.   CHIEF COMPLAINT:  Passing out.   HISTORY OF PRESENTING ILLNESS:  Mr. Keelin is a 45 year old gentleman  with history of an obstructive hydrocephalus secondary to Chiari I  malformation, status post VP shunt about 18 months ago.  He was at work  yesterday at [employer] and he felt a little lightheaded and weak  in his knees and suddenly passed out.  Duration of loss of consciousness  is unknown but he thinks was possibly for a few minutes.  There were no  noticed seizures, no bowel or bladder incontinence, no tongue biting.  No fevers, chills.  No chest pain.  However, he has felt his heart  racing intermittently for the past several weeks.   PAST MEDICAL HISTORY:  1. Hydrocephalus.  2. Seizures.  3. Chiari I malformation.  4. A-Fib.  5. Cardiomyopathy.  6. Pulmonary embolism.  7. Salt-wasting syndrome.  8. SIADH.   PAST SURGICAL HISTORY:  1. VP shunt.  2. Cholecystectomy.   ALLERGIES:  NO KNOWN DRUG ALLERGIES.   MEDICATIONS:  Are as follows:  1. Lamictal 100 mg tablet 1/2 tablet in the morning and full tablet at      bedtime.  2. Sertraline 25 mg at bedtime.  3. Lopressor 25 mg once a day.  4. Reglan 5 mg 3 times a day.  5. Sodium chloride tablets 3 g 3 times a day.   SOCIAL HISTORY:  He lives at home with his family.  Denies any smoking  or alcohol.   FAMILY HISTORY:  Discussed and noncontributory.   EXAM:  Temp, he is afebrile.  Pulse is 84.  Blood pressure 122/70.  Respirations 18.  Saturating 98% on room air.  GENERAL:  Alert, awake, oriented x3.  HEENT:  Pupils equal and reactive to light.  Extraocular movements  intact.  CARDIOVASCULAR SYSTEM:  S1 and S2.  Irregularly irregular rhythm.  LUNGS:  Clear to auscultation bilaterally.  ABDOMEN:  Soft, nontender.  Positive bowel sounds.  EXTREMITIES:  No edema, clubbing, cyanosis.  NEURO:  Exam is nonfocal.   LABS:  CBC, white count 9.8, hemoglobin 16, platelets 306, sodium 132,  potassium 4.1, chloride 99, bicarb 25, BUN 11, creatinine 0.8, glucose  90, calcium 9.3.  CPK 474, down from 532.  CK-MB 7.6 from 5.8.  Troponin  0.01 from 0.01.  EKG from July 13, 2008, is A-Fib with a rate of 109  with no acute ST-T changes.  Chest x-ray with no acute disease.  CT head  with no acute abnormality, however, severe hydrocephalus which is  improved  since January of 2009.  An interval development of bilateral  convexity, subdural hygromas without associated mass effect.  Low-lying  cerebellar tonsils consistent with Chiari I malformation, unchanged.   ASSESSMENT AND PLAN:  1. A 45 year old gentleman with syncope, possibly multifactorial      likely from A-Fib with rapid ventricular rate based on initial EKG      in [place], now rate controlled.  Continue Lopressor.  Has not      been on Coumadin per his cardiologist in [place] which the patient      thinks is due to some medication interactions.  We will check 2      more sets of cardiac enzymes.  We will monitor on telemetry and get      an echocardiogram.  Patient was also found to be mildly orthostatic      on admission at [facility], hence, we will hydrate with normal saline      at 75 mL an hour.  There were no clinical signs to suggest a      seizure although this is definitely a possibility.  Continue      Lamictal and seizure precautions.  Given hydrocephalus and subdural      hygromas, we will transfer to [facility] for neurosurgical       evaluation.  2. 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  3. Deep venous thrombosis prophylaxis with Lovenox.  4. Code status:  Patient is a full code.      Zannie Cove, MD  Electronically Signed     PJ/MEDQ  D:  07/14/2008  T:  07/14/2008  Job:  [id number]   cc:   Dr. Phylliss Bob, [place]   Dr. Edmonia Caprio, M.D.  Fax: [phone]   Alfonse Alpers. Dagoberto Ligas, M.D.  Fax: [phone]

## 2010-06-07 NOTE — Op Note (Signed)
[name], [name] NO.:  [id number]   MEDICAL RECORD NO.:  [id number]          PATIENT TYPE:  INP   LOCATION:  3029                         FACILITY:  [facility]   PHYSICIAN:  Hilda Lias, M.D.   DATE OF BIRTH:  [birth year]   DATE OF PROCEDURE:  01/15/2007  DATE OF DISCHARGE:                               OPERATIVE REPORT   PREOPERATIVE DIAGNOSES:  1. Normal pressure hydrocephalus.  2. Chronic hyponatremia.   POSTOPERATIVE DIAGNOSES:  1. Normal pressure hydrocephalus.  2. Chronic hyponatremia.   PROCEDURE:  Insertion of right VP shunt Hakim 140 opening pressure.   SURGEON:  Hilda Lias, M.D.   ASSISTANT:  Clydene Fake, M.D.   HISTORY:  Mr. Wilms is a gentleman with normal pressure hydrocephalus  who underwent incisional shunt several weeks ago.  The patient went  home, did well, he improved clinically, but then he developed some  infection of the skin, of the abdomen.  Eventually the shunt had to be  removed because of infection.  He was started on antibiotics as per  infectious disease.  Also he had been seen by the endocrinologist  because of the low sodium.  Yesterday the infectious disease gave [place] the  green light to proceed with incisional new shunt.   DESCRIPTION OF PROCEDURE:  The patient was taken to the OR and the  previous tape on the forehead, on the neck, and abdomen were removed.  The scalp, neck, chest, and abdomen were scrubbed first with Betadine,  and after we used a DuraPrep.  Then drapes were applied.  Following the  previous incision in the right frontal area, the incision was carried  down until we found the previous bur hole.  Scar tissue was removed.  Then we did an incision in the right upper quadrant following the  previous one through the skin, subcutaneous tissue, down into the  peritoneum.  We were able to find the __________  without any problems.  Then the shunt was introduced subcutaneously from the scalp all the  way  down to the abdomen.  The ventricular catheter was inserted.  The  pressure was really low.  Closure was done using #0 silk.  We had a good  flow of CSF then at the distal part of the catheter.   Then the scalp was closed with Vicryl and staples.  The abdomen was  closed with 3-0 silk, The abdominal wall with muscle and fascia with 2-0  Vicryl, and the skin with staples.  The patient is stable and he is  going to go to PACU.           ______________________________  Hilda Lias, M.D.    EB/MEDQ  D:  01/15/2007  T:  01/15/2007  Job:  [id number]

## 2010-06-07 NOTE — Discharge Summary (Signed)
[name], [name]             ACCOUNT NO.:  [id number]   MEDICAL RECORD NO.:  [id number]          PATIENT TYPE:  INP   LOCATION:  6532                         FACILITY:  [facility]   PHYSICIAN:  Michelene Gardener, MD    DATE OF BIRTH:  10-06-1965   DATE OF ADMISSION:  07/14/2008  DATE OF DISCHARGE:  07/16/2008                               DISCHARGE SUMMARY   DISCHARGE DIAGNOSES:  1. Syncope, most likely vasovagal.  2. Hyponatremia, which is chronic and improved.  3. Paroxysmal atrial fibrillation.  4. History of seizure.  5. History of hydrocephalus status post arteriovenous shunt that has      been stable during this hospitalization.  6. History of a pulmonary embolism.   DISCHARGE MEDICATIONS:  1. Lamictal 100 mg at bedtime and 50 mg in the morning.  2. Sertraline 25 mg once a day.  3. Lopressor 25 mg once a day.  4. Reglan 5 mg 3 times daily.  5. Sodium chloride tablets 3 g 3 times a day.   CONSULTATION:  Neurosurgical consult.   PROCEDURES:  None.   DIAGNOSTIC STUDIES:  1. CT scan of the head on July 14, 2008, showed ventricular      enlargement, unchanged from prior study.  2. Repeat CT head on July 15, 2008, showed stable exam with      ventriculomegaly and true ventriculostomy.   Follow up with primary doctor within a week and with neurosurgeon as  scheduled.   COURSE OF HOSPITALIZATION:  1. Syncope:  This event is most likely a vasovagal syncope.  The      patient was admitted initially to step-down unit and then was      transferred to telemetry.  His telemetry did not show any evidence      of arrhythmia.  Three sets of troponin and cardiac enzymes were      done, and they came to be normal.  Echocardiogram was done and      showed normal ejection fraction around 55-60% with no wall motion      abnormalities.  This episode is most likely  vasovagal and no need      for further intervention at this point unless he had recurrence.  2. Chronic hyponatremia:  The  patient has been on chronic sodium      tablets, and he needs to take his medications.  Came in with low      sodium, so the patient was just put back on to his sodium tablets,      and his sodium was corrected.  At the time of discharge, it was      136.  3. Atrial fibrillation:  The patient is known for paroxysmal atrial      fibrillation that has been followed by his primary doctor as an      outpatient.  He was not on Coumadin for unclear reasons.  During      this hospital, he has been in atrial fibrillation initially that      has been rate uncontrolled and then it was controlled throughout  his hospitalization.  I checked his echocardiogram that came to be      normal.  I advised him to follow up with his cardiologist for      possible consideration of Coumadin.  4. History of seizure:  No recent seizure.  The patient has been on      Lamictal.  5. History of hydrocephalus status post AV shunt:  His CAT scan showed      normal cells x2 without change from his previous scan.      Neurosurgical consultation was requested, but the patient was never      seen by them.  I spoke to them over the phone, and they think he      does not have any acute issue with his current normal CT.  The      patient will continue follow up with Neurosurgery as an outpatient      as scheduled.  6. History of PE that was around 4-5 years ago, and the patient      received Coumadin for around 4 months at that time.   This patient is stable to be discharged.  He will be followed by his  primary doctor and with Neurosurgery as an outpatient.   Total assessment time is 40 minutes.      Michelene Gardener, MD  Electronically Signed     NAE/MEDQ  D:  07/16/2008  T:  07/17/2008  Job:  [id number]

## 2010-06-07 NOTE — Discharge Summary (Signed)
[name], [name] NO.:  [id number]   MEDICAL RECORD NO.:  [id number]          PATIENT TYPE:  INP   LOCATION:  3037                         FACILITY:  [facility]   PHYSICIAN:  Hilda Lias, M.D.   DATE OF BIRTH:  October 25, 1965   DATE OF ADMISSION:  12/04/2006  DATE OF DISCHARGE:  12/14/2006                               DISCHARGE SUMMARY   ADMISSION DIAGNOSES:  1. Hydrocephalus.  2. Hyponatremia.  3. 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   FINAL DIAGNOSES:  1. Hydrocephalus.  2. Hyponatremia.  3. 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   CLINICAL HISTORY:  [name] is a gentleman who was seen in my office  because of difficulty walking, problem with memory, increasing urinary  frequency.  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  The  patient has a history of hyponatremia.  Also, he has a history of  diabetes insipidus.  Because of the findings, the family decided to go  ahead with the surgery.   HOSPITAL COURSE:  The patient was taken to surgery on December 04, 2006,  and a right ventriculoperitoneal shunt was inserted.  The opening  pressure was 140.  The patient was kept in the intensive care unit  overnight, he was transferred to the floor.  Nevertheless, on December 07, 2006, while he was in his room, his wife noticed that he was a  little bit confused and slowly he developed a seizure episode.  The [facility]  Code team was called, he was resuscitated.  He was transferred to the  intensive care unit.  At that time the sodium  was 116.  I consulted  with the critical care physician, and we started restriction of fluid.  The DDAVP which was given to him prior to surgery was stopped.  The  patient was stable and later on he was transferred to the floor.  The  patient had been really stable at the present time, he has no  complaints, the shunt is working really well.  The wounds are  fine.  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  We did Doppler his thigh, which was negative.  He is  ready to go home today.   CONDITION ON DISCHARGE:  Improved.   MEDICATIONS:  He is going to be taking Percocet for pain, also some  sodium.  He is not to take DDAVP.   FOLLOWUP:  He will be seen in my office in a week, so I will remove the  staples.  He also has an appointment to have a blood test to check his  electrolytes this coming Monday.  I talked to him, his wife, and both  parents, who were present.  He is going to be off the Keppra since the  seizure that he developed most likely secondary to the hyponatremia.  If  any questions, they are going to call me at any time.   DIET:  Regular.   ACTIVITY:  Not to do any  heavy lifting.           ______________________________  Hilda Lias, M.D.     EB/MEDQ  D:  12/14/2006  T:  12/14/2006  Job:  [id number]

## 2010-06-07 NOTE — Op Note (Signed)
NAMEWEYLIN, PLAGGE NO.:  [id number]   MEDICAL RECORD NO.:  [id number]          PATIENT TYPE:  INP   LOCATION:  2899                         FACILITY:  [facility]   PHYSICIAN:  Hilda Lias, M.D.   DATE OF BIRTH:  Apr 15, 1965   DATE OF PROCEDURE:  12/04/2006  DATE OF DISCHARGE:                               OPERATIVE REPORT   PREOPERATIVE DIAGNOSIS:  Hydrocephalus secondary to stenosis of the  aqueduct.   POSTOPERATIVE DIAGNOSES:  Hydrocephalus secondary to stenosis of the  aqueduct.   PROCEDURE:  Insertion of right ventriculoperitoneal shunt. Codman.  Opening pressure 140.   SURGEON:  Hilda Lias, M.D.   CLINICAL HISTORY:  The patient was seen by me in my office because of  seizure and difficulty walking, problems with memory, increased urinary  frequency.  The patient had an x-ray about 2 years ago which showed  hydrocephalus with stenosis of the aqueduct.  Repeat MRI showed  worsening of the hydrocephalus.  Surgery was advised, and the risks were  explained to him and his wife.   PROCEDURE:  The patient was taken to the OR, and after intubation, the  right side of the head, neck, chest and abdomen were shaved and cleaned  with DuraPrep.  Drapes were applied.  Then, incision in the right  frontal area away from the midline and in front of the coronal suture  was made.  A hole was made in the frontal bone, and the dura mater was  opened.  Then, incision in the right upper quadrant was done.  The  patient previously had colon surgery as well as gallbladder surgery.  Incision was made through the skin and subcutaneous tissue.  We split  the muscle, and we found the fascia.  Opening was made.  The patient had  quite a bit of adhesion between the intestine and the peritoneum.  Incision was made, and we were able to get into the abdominal cavity  without a problem.  Then, a shunt passer was inserted from the abdomen  all the way down to the upper part of the  neck.  Then, from this area  into the right frontal area,  another connection was made.  Then, the  ventriculoperitoneal shunt was pulled all of the way leaving valve in  the head and the rest going to the abdominal cavity.  The ventricular  catheter was inserted, and immediately clear CSF high pressure came  immediately.  This specimen was sent to the laboratory.  Then, the  catheter was connected to the bowel.  The peritoneal part was inserted  into abdominal cavity.  The wound in the abdomen was closed with Vicryl  below the fascia, also Vicryl to close to put together the abdominal  muscle and then the subcutaneous space with a 2-0 Vicryl and the skin  with staples.  The head was closed also with 2-0 Vicryl and staples and  sewing the neck.  At the end, we had good flow of the catheter and the  CSF in the intra-abdominal cavity.  The valve was soft.  The operative  pressure would be  140.  We are going to follow him in the hospital and  later on in the office to see if we need to decrease the amount of the  drainage.           ______________________________  Hilda Lias, M.D.     EB/MEDQ  D:  12/04/2006  T:  12/05/2006  Job:  [id number]

## 2010-06-07 NOTE — Op Note (Signed)
NAMEIVORY, BAIL NO.:  [id number]   MEDICAL RECORD NO.:  [id number]          PATIENT TYPE:  INP   LOCATION:  3307                         FACILITY:  [facility]   PHYSICIAN:  Hilda Lias, M.D.   DATE OF BIRTH:  20-Jun-1965   DATE OF PROCEDURE:  01/02/2007  DATE OF DISCHARGE:                               OPERATIVE REPORT   PREOPERATIVE DIAGNOSES:  1. Rule out abdominal wound infection.  2. Rule out malfunction of the ventriculoperitoneal shunt.  3. Status post right ventriculoperitoneal shunt.   PROCEDURE:  Exploration of the abdominal wall, specimen to the  laboratory, resection of the peritoneal catheter.   SURGEON:  Hilda Lias, M.D.   ANESTHESIA:  IV plus local.   CLINICAL HISTORY:  Mr. Montesinos is a gentleman who underwent a VP shunt  about three weeks ago.  He did well.  I saw him in my office later last  week, where the staples were removed.  Clinically, he did improve from  his symptoms.  Nevertheless, he came to the emergency room over the  weekend because of redness in the right upper quadrant right at the  level of the wound.  The laboratory showed that the white cells were  normal.  He has not had any evidence of temperature while in the  hospital, except two nights ago when his temperature went to 103.  He  denies any headache and is clinically stable.  The only problem has been  lower sodium, which is a chronic problem with him, and this problem had  been addressed by the endocrinologist.  Surgery was advised.  We wanted  to explore the area and try to find out the location of the tip of the  catheter and to see if there was any evidence of any infection  whatsoever.  The goal was to proceed and bring the catheter to the  outside for drain if there was any evidence of infection.  If not, we  were going to go ahead and continue with the resection of the abdominal  catheter portion.   PROCEDURE:  The patient was taken to the operating room,  and the right  chest wall as well as the abdomen were cleaned with DuraPrep.  Drapes  were applied.  We made the incision in the previous abdominal wound.  The incision was carried down all the way down to the fat area.  Indeed,  we found that the catheter was coiled into the fat area away from the  peritoneal cavity.  We explored the abdominal wall, and there was not  any evidence of pus whatsoever.  Nevertheless, we took several  specimens.  Then, we saw that the catheter was draining CSF, which was  absolutely crystal clear.  A specimen from the abdominal wound was sent  to the laboratory as well as the CSF.  Then, the area was irrigated.  We  did more exploration, and we did not find any gross evidence of  infection.  We explored the area into the peritoneal cavity, and indeed  we were able to find the hole where the catheter was  before.  Because  there was not any evidence of any gross infection, we went ahead and  introduced the catheter adding at  least 2 extra __________ with the connector and seal.  The wound was  irrigated, and it was closed with Vicryl and staples.  The patient is  going to continue with the IV antibiotics, and we are going to wait for  final cultures from the lab.           ______________________________  Hilda Lias, M.D.     EB/MEDQ  D:  01/02/2007  T:  01/03/2007  Job:  [id number]

## 2010-06-07 NOTE — Assessment & Plan Note (Signed)
[facility] CARDIOLOGY OFFICE NOTE   [name], [name] BRAYEN BUNN                    MRN:          [id number]  DATE:02/22/2009                            DOB:          03/12/1965    Mr. Tangeman is seen in followup for syncope.  He received implantable  loop recorder in October.  He is also catheted by Dr. Algie Coffer  demonstrating normal coronaries and normal left ventricular function.   He has had no intercurrent episodes.   His medication list includes; sertraline, amlodipine, omeprazole,  fludrocortisone, salt tabs, and lamotrigine.   PHYSICAL EXAMINATION:  VITAL SIGNS:  Today, his blood pressure is  134/83, his pulse is 63.  LUNGS:  Were clear.  HEART:  Sounds were regular.  EXTREMITIES:  Were without edema.   Interrogation of his [employer] [place] [id number] loop recorder demonstrated no  intercurrent events.   IMPRESSION:  1. 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  2. Status post loop recorder for the above.   We will continue to follow him symptomatically.  We will need to check a  metabolic profile given the fact that he is on Florinef.   We will see him again in 6 months' time.     Duke Salvia, MD, [facility]  Electronically Signed    SCK/MedQ  DD: 02/22/2009  DT: 02/23/2009  Job #: [id number]

## 2010-06-07 NOTE — H&P (Signed)
NAMELESTER, CRICKENBERGER NO.:  [id number]   MEDICAL RECORD NO.:  [id number]          PATIENT TYPE:  INP   LOCATION:  3007                         FACILITY:  [facility]   PHYSICIAN:  Danae Orleans. Venetia Maxon, M.D.  DATE OF BIRTH:  24-Jun-1965   DATE OF ADMISSION:  12/30/2006  DATE OF DISCHARGE:                              HISTORY & PHYSICAL   REASON FOR ADMISSION:  Redness surrounding abdominal incision with  cellulitis.   HISTORY OF ILLNESS:  [name] is a 45 year old man status post  ventriculoperitoneal shunt placement by Dr. Jeral Fruit on December 04, 2006,  who developed redness around his abdominal incision today, and  cellulitis this evening.  He denies any headache, denies any  meningismus.  He has no significant abdominal tenderness.  He is  afebrile.  He has memory loss and tremors with seizure at baseline.  White blood count is slightly elevated with slightly increased left  shift.  He had his staples removed in the office by Dr. Jeral Fruit 2 days  ago.   PAST MEDICAL HISTORY:  Significant for hypertension, hydrocephalus,  seizures.   MEDICATIONS:  Lamictal, Ditropan, Reglan.   ALLERGIES:  No known drug allergies.   PHYSICAL EXAMINATION:  VITAL SIGNS:  Temperature 96.6, pulse 90,  respiratory rate 22, blood pressure 153/105.  GENERAL:  He is awake, alert, conversant.  He has poor memory at  baseline.  He has no meningismus.  HEENT:  His cranial incisions are healing well.  His pupils are equal,  round, and reactive to light.  Extraocular movements are intact.  ABDOMEN:  His abdominal incision has an approximately 10 cm-sized area  of cellulitis.  No drainage from the incision.  He has minimal abdominal  discomfort away from the incision.  He has increased tremor on his left  arm over the last couple of days compared to his baseline.   IMPRESSION:  [name] is a 45 year old male with cellulitis  around his abdominal incision.  A shunt was placed for  hydrocephalus on  December 04, 2006 by Dr. Jeral Fruit.  Will check a head CT and shunt series.  No need to tap his shunt, as I do not suspect a shunt infection.  This  is more of an incisional infection.  Will start vancomycin and Rocephin,  and observe for improvement in cellulitis, which was demarcated by me on  his abdominal wall.      Danae Orleans. Venetia Maxon, M.D.  Electronically Signed     JDS/MEDQ  D:  12/30/2006  T:  12/30/2006  Job:  [id number]

## 2010-06-07 NOTE — Consult Note (Signed)
[name], [name] NO.:  [id number]   MEDICAL RECORD NO.:  [id number]          PATIENT TYPE:  INP   LOCATION:  3007                         FACILITY:  [facility]   PHYSICIAN:  Alfonse Alpers. Gegick, M.D.DATE OF BIRTH:  1965-06-25   DATE OF CONSULTATION:  12/31/2006  DATE OF DISCHARGE:                                 CONSULTATION   HISTORY:  This is a 45 year old man who has a chronic history of  hydrocephalous.  He apparently also has a history of diabetes insipidus,  and he was treated for several years with DDAVP at 0.2 mg which he took  each night.  This was started approximately 8 years ago.  He, in July of  this year, was found to have a decreased sodium of 124, and this was  repaired to a higher number with an unclear course of action at that  time.  He probably did receive fluid deprivation.  On November 28, 2006,  he was admitted to the hospital, and at that time he had a sodium of  129, potassium 4.6, chloride 99, and a CO2 of 25.  He had a  ventriculoperitoneal shunt placed then.  He presents now with a  cellulitis at that area, and his presentation electrolytes showed a  sodium of 127, potassium of 3.8, chloride 93 and a CO2 of 27.  The  patient did have the DDAVP stopped approximately 2 or 3 weeks  previously.  In the hospital, he has had a urine osmolality which was  low at 191.   PAST MEDICAL HISTORY:  Aside from his hydrocephalus, he probably has  been in reasonably good health.   SOCIAL HISTORY:  He does not smoke or drink excessive amounts of  alcohol.   ALLERGIES:  No history of allergies.   MEDICATIONS PRIOR TO THIS ADMISSION:  1. Lamictal.  2. Reglan.  3. Ditropan.  4. Nu-Iron.   He also is receiving IV fluids at the present time.   REVIEW OF SYSTEMS:  His weight has been stable.  CARDIOVASCULAR/RESPIRATORY:  No shortness of breath is present.  GI:  No  specific complaints.   PHYSICAL EXAMINATION:  GENERAL:  A well-developed man who  appeared in no  distress.  He is able to talk.  He has decreased memory on some of the  particulars of his medication regimen.  LUNGS:  Clear.  CARDIOVASCULAR:  Rhythm is regular.  ABDOMEN:  Soft.  EXTREMITIES:  Possible 1+ pedal edema.   His urine osmolality is low, but inappropriately elevated for the serum  osmolality.  A urine sodium will help [place] at this point.  I suspect that  the problem that he has is the DDAVP resulted in hyponatremia which over  a long period  time reduced the kidneys concentrating capabilities.  Once we reverse  that with a fluid restriction, I suspect that we will probably end up  seeing a cerebral salt wasting syndrome occur.  Will wait until we get  to that point before starting medications at that time.  Thank you for  the opportunity in seeing this patient.  ______________________________  Alfonse Alpers Dagoberto Ligas, M.D.     CGG/MEDQ  D:  12/31/2006  T:  12/31/2006  Job:  [id number]

## 2010-06-07 NOTE — H&P (Signed)
NAME:  [name], [name] NO.:  [id number]   MEDICAL RECORD NO.:  [id number]          PATIENT TYPE:  INP   LOCATION:  NA                           FACILITY:  [facility]   PHYSICIAN:  Hilda Lias, M.D.   DATE OF BIRTH:  [birth year]   DATE OF ADMISSION:  DATE OF DISCHARGE:                              HISTORY & PHYSICAL   HISTORY OF PRESENT ILLNESS:  The patient came to see me about a month  ago with a history of headache, nausea, increased urinary frequency,  unsteady gait,  memory loss and history of seizure.  The patient, in the  year 2000, had an x-ray which showed hydrocephalus secondary to  aqueductal stenosis.  Lately, he is having difficulty driving.  He had  some black outs.  He had a repeat MRI and because of the findings he  wants to proceed with surgery.   PAST MEDICAL HISTORY:  Cholecystectomy, colonoscopy prostate surgery.   SOCIAL HISTORY:  Negative.   REVIEW OF SYSTEMS:  Positive for __________, vomiting, difficulty with  his urination, leg weakness, problem with memory, inability to  concentrate.   PHYSICAL EXAMINATION:  HEAD, EARS, NOSE AND THROAT:  Normal.  NECK:  He has good thrill __________ sign.  LUNGS:  Clear.  HEART:  Sounds normal.  ABDOMEN:  Normal.  EXTREMITIES: Normal pulses.  NEUROLOGIC:  Mental status normal.  Cranial nerves normal.  He has no  weakness whatsoever.  His gait is wide-based and is a little bit  unsteady.   The x-rays show that he has hydrocephalus with stenosis of the aqueduct.  The fourth ventricle is normal.   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   RECOMMENDATIONS:  The patient wants to proceed with surgery.  I talked  to him and his wife at length.  The procedure will be a VP shunt.  We  are going to start using the valve to start draining about 130, 140 and  then slowly decrease the pressure of the valve.  We want to avoid the  possibility of over drainage with the propensity of subdural  hematoma.  The surgery was fully explained to them and they know about the  possibility of bleeding, hematoma, infection and scar tissue in the  abdomen which might prevent [place] from inserting the catheter in the  peritoneal cavity.           ______________________________  Hilda Lias, M.D.     EB/MEDQ  D:  12/04/2006  T:  12/04/2006  Job:  [id number]

## 2010-06-07 NOTE — Discharge Summary (Signed)
NAMEBERMAN, [name] NO.:  [id number]   MEDICAL RECORD NO.:  [id number]          PATIENT TYPE:  INP   LOCATION:  3029                         FACILITY:  [facility]   PHYSICIAN:  Alfonse Alpers. Gegick, M.D.DATE OF BIRTH:  Oct 19, 1965   DATE OF ADMISSION:  12/30/2006  DATE OF DISCHARGE:                               DISCHARGE SUMMARY   HISTORY:  This is a 45 year old man who has a history of chronic  hydrocephalus.  As far as can be determined, he had a history of  hyponatremia in the past.  He was found to have increased urine output  and apparently was treated for diabetes insipidus.  He was taking DDAVP  0.2 mg daily for a chronic period of time, probably 6 months.  He was  having decreased sodium at that time and his low sodium was persistent  for at least 2-3 months.  The patient was admitted to the hospital  previously when he had a ventriculoperitoneal shunt in place, and at  that time he had marked difficulty maintaining his sodium in a normal  range.  His sodium decreased to 113, and he had seizures.  He was  treated as a cerebral salt wasting syndrome.  The patient was then  discharged from the hospital receiving 1 gram tablets of salt and  presents again now with an infection in the ventroperitoneal shunt area.  He was seen by infectious disease and infectious disease recommended  antibiotics.  He also had a persistent hyponatremia.  His sodium  decreased.  He was receiving vancomycin for his shunt infection.  His  sodium decreased to 124 which was noted in July 2008.  And then when he  was evaluated in December 2008, his sodium was 127.  The impression at  that time was that he had excessive amounts of DDAVP which resulted in  hyponatremia.  The hyponatremia resulted in a decreased set point for  his sodium level.  He was treated with a standard approach of fluid  restriction without effect.  The sodium decreased to 124 while he was  having increased sodium  restriction.  His BUN was 5.  He was moderately  symptomatic with only symptoms of feeling weak.  Florinef was added with  the thought that he did have cerebral salt wasting.  This was not  effective immediately.  His sodium decreased to 117 and 3% saline was  given.  The urinary sodium was done and his urinary sodium was elevated.  With the standard approach, the sodium continued to remain low.  His  sodium was 118 and he was started on conivaptan.  The patient was not  given a bolus, but started on a maintenance drip of 10 cc/hour.  His  symptoms essentially remained the same.  He was somewhat weak.  The  sodium improved to 121, and the conivaptan was continued.  Mentally he  improved.  A urine sodium was 73, with a serum sodium of 116.  He was  monitored in the intensive care unit where we can monitor his low sodium  frequently.  Throughout this time, he was followed  by Infectious  Disease, monitoring his clinical course and also treating him with  vancomycin.  The sodium then dropped to 119 while receiving the  conivaptan.  The conivaptan was then discontinued.  His serum sodium  continued to drop.  Conivaptan was repeated along with sodium at 3%.  This improved to 122; however, the effect of conivaptan became rapidly  less pronounced.  In fact, while he was taking it for approximately 2  days, his sodium continued to drop.  In view of this, that he is unable  to receive respond to standard approaches for syndrome of inappropriate  ADH, it was most likely that the patient had cerebral salt wasting and  perhaps a component of syndrome of inappropriate ADH.  He was then  started on 3% saline.  The sodium increased from 118-123.  He felt  significantly better.  His infection gradually improved.  His I&O's were  impressive in that they had a continuously negative I&O values over a  period of 5 days being behind approximately 6 liters.  He was given 3%  sodium chloride, and his oral intake  was increased.  Sodium gradually  increased to 126.  His I&O's improved.  His sodium in the urine  continued to increase as we improved his serum sodium.  Sodium increased  to 126 with a potassium of 3.9 while continuing on 3% infusion of sodium  chloride.  Gradually, it was decided that he was stable.  His sodium was  hovering around 126-128.  He was feeling significantly improved.  He had  increased muscle strength.  Previously, he was unable to even lift his  arms to feed himself.  This has improved drastically.  He had physical  therapy involved also.  The patient then had replacement of a new  ventriculoperitoneal shunt.  The sodium was switched from an IV  preparation to an oral preparation.  He was given initially a 4 grams  tablet which he was taking three times daily.  He continued to improve  clinically.  His serum sodium continues to hover around 128-130.  He  felt significantly better and it is assumed that this will improve over  a period of time.  He was also found to have some pedal edema, and he  was treated with Lasix which increased his serum sodium.  He was then  discharged.   IMPRESSION ON DISCHARGE:  1. Cerebral salt wasting.  2. History of hydrocephalus.   MEDICATIONS ON DISCHARGE:  1. Lamictal 50 mg in the morning and 100 mg in the evening.  2. Ditropan 10 mg daily.  3. Florinef 0.1 mg, two in the morning and two of the evening.  4. Lasix 20 mg q.a.m.  5. Potassium 20 mEq q.a.m.  6. Salt tablets 4 grams four times daily.   DISCHARGE DIET:  As tolerated.   FOLLOW-UP:  To be seen in the office in period of 2 days.   CONDITION ON DISCHARGE:  Improved.           ______________________________  Alfonse Alpers Dagoberto Ligas, M.D.     CGG/MEDQ  D:  01/23/2007  T:  01/23/2007  Job:  [id number]   cc:   Hilda Lias, M.D.

## 2010-06-07 NOTE — Op Note (Signed)
[name], [name] NO.:  [id number]   MEDICAL RECORD NO.:  [id number]          PATIENT TYPE:  INP   LOCATION:  3307                         FACILITY:  [facility]   PHYSICIAN:  Hilda Lias, M.D.   DATE OF BIRTH:  1965-02-26   DATE OF PROCEDURE:  01/03/2007  DATE OF DISCHARGE:                               OPERATIVE REPORT   PREOPERATIVE DIAGNOSIS:  Infection of VP shunt.   POSTOPERATIVE DIAGNOSIS:  Infection of VP shunt.   PROCEDURE:  Removal of VP shunt.   SURGEON:  Hilda Lias, M.D.   HISTORY:  [name] is a gentleman who underwent a VP shunt for  hydrocephalus.  It was infected.  Yesterday we cleaned the lumbar area  and later on we did some culture of the catheter in the head.  Both of  the ends showed both of them positive and because of that the best way  to treat infection was to do a complete removal of the shunt.  I talked  to him and his wife and fully agreed.   DESCRIPTION OF PROCEDURE:  [name] was taken to the OR and the head  and the right abdomen were prepped with DuraPrep.  An incision following  the previous one in the scalp was made and immediately we were able to  pull out the intraventricular catheter as well as the valve.  The shunt,  especially the junction in the thoracic area became disconnected.  Because of that, we made a small incision about 1/2 and inch in the  abdomen. The incision was carried out to the subcutaneous space and then  the rest of the catheter the peritoneal portion was removed.  Then both  area were irrigated and closed with Vicryl and staples.  The patient  will continue with the same antibiotic therapy as before surgery.           ______________________________  Hilda Lias, M.D.     EB/MEDQ  D:  01/03/2007  T:  01/04/2007  Job:  [id number]

## 2010-07-07 ENCOUNTER — Encounter: Payer: Self-pay | Admitting: Internal Medicine

## 2010-07-11 ENCOUNTER — Encounter (INDEPENDENT_AMBULATORY_CARE_PROVIDER_SITE_OTHER): Payer: BC Managed Care – PPO | Admitting: Internal Medicine

## 2010-07-11 DIAGNOSIS — Z959 Presence of cardiac and vascular implant and graft, unspecified: Secondary | ICD-10-CM

## 2010-07-11 DIAGNOSIS — R55 Syncope and collapse: Secondary | ICD-10-CM

## 2010-07-12 NOTE — Letter (Deleted)
July 11, 2010    RE:  Philip, [name] MRN:  [id number]  /  DOB:  Mar 25, 1965  [name] is seen in follow-up for syncope and nausea.  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  It turns out that his __________  VP shunt was not working and he underwent shunt revision and implantation of a contralateral shunt and his symptoms have largely abated.  This includes his lightheadedness, dizziness, as well as nausea.  CURRENT MEDICATIONS: 1. Sertraline 125. 2. Lisinopril 20. 3. Propranolol 10. 4. Furosemide 40.  EXAMINATION:  VITAL SIGNS:  Blood pressure was 117/81.  His pulse was 71. LUNGS:  Clear. HEART:  Sounds were regular. EXTREMITIES:  Without edema. SKIN:  Warm and dry.  Interrogation of his loop recorder demonstrated multiple evidence of undersensing.  He has had no intercurrent syncope.  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  We will see him again in 1 year.  In the event that he has another episode, I have asked him to call us immediately as there are multiple overwritten episodes and he does not have his activator, we will not be able to track down underlying rhythms.   Sincerely,     Duke Salvia, MD, [facility]   SCK/MedQ  DD: 07/11/2010  DT: 07/12/2010  Job #: [id number]  CC:    Dr. Cliffton Asters

## 2010-07-12 NOTE — Assessment & Plan Note (Signed)
[facility] CARDIOLOGY OFFICE NOTE  [name], [name]                    MRN:          [id number] DATE:07/11/2010                            DOB:          [birth year]   [name] is seen in follow-up for syncope and nausea.  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  It turns out that his __________  VP shunt was not working and he underwent shunt revision and implantation of a contralateral shunt and his symptoms have largely abated.  This includes his lightheadedness, dizziness, as well as nausea.  CURRENT MEDICATIONS: 1. Sertraline 125. 2. Lisinopril 20. 3. Propranolol 10. 4. Furosemide 40.  EXAMINATION:  VITAL SIGNS:  Blood pressure was 117/81.  His pulse was 71. LUNGS:  Clear. HEART:  Sounds were regular. EXTREMITIES:  Without edema. SKIN:  Warm and dry.  Interrogation of his loop recorder demonstrated multiple evidence of undersensing.  He has had no intercurrent syncope.  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  We will see him again in 1 year.  In the event that he has another episode, I have asked him to call us immediately as there are multiple overwritten episodes and he does not have his activator, we will not be able to track down underlying rhythms.    Duke Salvia, MD, [facility]    SCK/MedQ  DD: 07/11/2010  DT: 07/12/2010  Job #: [id number]  cc:   Arlan Organ

## 2010-07-19 ENCOUNTER — Encounter: Payer: Self-pay | Admitting: Cardiovascular Disease

## 2010-09-28 ENCOUNTER — Other Ambulatory Visit: Payer: Self-pay | Admitting: Neurosurgery

## 2010-09-28 ENCOUNTER — Ambulatory Visit
Admission: RE | Admit: 2010-09-28 | Discharge: 2010-09-28 | Disposition: A | Discharge status: Home / Self Care | Payer: BC Managed Care – PPO | Source: Ambulatory Visit | Attending: Neurosurgery | Admitting: Neurosurgery

## 2010-09-28 DIAGNOSIS — G919 Hydrocephalus, unspecified: Secondary | ICD-10-CM

## 2010-10-28 LAB — BASIC METABOLIC PANEL
BUN: 2 — ABNORMAL LOW
BUN: 3 — ABNORMAL LOW
BUN: 3 — ABNORMAL LOW
BUN: 4 — ABNORMAL LOW
BUN: 4 — ABNORMAL LOW
BUN: 5 — ABNORMAL LOW
BUN: 5 — ABNORMAL LOW
BUN: 5 — ABNORMAL LOW
BUN: 6
BUN: 6
BUN: 6
BUN: 6
BUN: 7
BUN: 7
BUN: 7
BUN: 7
BUN: 7
BUN: 7
BUN: 7
BUN: 7
BUN: 8
BUN: 8
BUN: 8
BUN: 8
CO2: 23
CO2: 24
CO2: 25
CO2: 25
CO2: 25
CO2: 26
CO2: 26
CO2: 26
CO2: 26
CO2: 26
CO2: 26
CO2: 26
CO2: 27
CO2: 27
CO2: 27
CO2: 27
CO2: 28
CO2: 28
CO2: 28
Calcium: 7.5 — ABNORMAL LOW
Calcium: 7.8 — ABNORMAL LOW
Calcium: 7.9 — ABNORMAL LOW
Calcium: 8 — ABNORMAL LOW
Calcium: 8 — ABNORMAL LOW
Calcium: 8.1 — ABNORMAL LOW
Calcium: 8.1 — ABNORMAL LOW
Calcium: 8.1 — ABNORMAL LOW
Calcium: 8.2 — ABNORMAL LOW
Calcium: 8.3 — ABNORMAL LOW
Calcium: 8.3 — ABNORMAL LOW
Calcium: 8.3 — ABNORMAL LOW
Calcium: 8.3 — ABNORMAL LOW
Calcium: 8.3 — ABNORMAL LOW
Calcium: 8.4
Calcium: 8.4
Calcium: 8.4
Calcium: 8.4
Calcium: 8.4
Calcium: 8.4
Calcium: 8.4
Calcium: 8.5
Calcium: 8.5
Calcium: 8.5
Calcium: 8.5
Calcium: 8.6
Calcium: 8.7
Calcium: 8.7
Calcium: 8.7
Calcium: 8.7
Calcium: 9.1
Calcium: 9.1
Chloride: 83 — ABNORMAL LOW
Chloride: 84 — ABNORMAL LOW
Chloride: 85 — ABNORMAL LOW
Chloride: 85 — ABNORMAL LOW
Chloride: 86 — ABNORMAL LOW
Chloride: 86 — ABNORMAL LOW
Chloride: 88 — ABNORMAL LOW
Chloride: 90 — ABNORMAL LOW
Chloride: 90 — ABNORMAL LOW
Chloride: 90 — ABNORMAL LOW
Chloride: 91 — ABNORMAL LOW
Chloride: 91 — ABNORMAL LOW
Chloride: 91 — ABNORMAL LOW
Chloride: 92 — ABNORMAL LOW
Chloride: 93 — ABNORMAL LOW
Chloride: 93 — ABNORMAL LOW
Chloride: 93 — ABNORMAL LOW
Chloride: 95 — ABNORMAL LOW
Chloride: 95 — ABNORMAL LOW
Chloride: 95 — ABNORMAL LOW
Chloride: 95 — ABNORMAL LOW
Chloride: 95 — ABNORMAL LOW
Chloride: 95 — ABNORMAL LOW
Chloride: 96
Chloride: 96
Creatinine, Ser: 0.59
Creatinine, Ser: 0.62
Creatinine, Ser: 0.62
Creatinine, Ser: 0.62
Creatinine, Ser: 0.64
Creatinine, Ser: 0.65
Creatinine, Ser: 0.67
Creatinine, Ser: 0.68
Creatinine, Ser: 0.68
Creatinine, Ser: 0.68
Creatinine, Ser: 0.68
Creatinine, Ser: 0.7
Creatinine, Ser: 0.71
Creatinine, Ser: 0.71
Creatinine, Ser: 0.72
Creatinine, Ser: 0.74
Creatinine, Ser: 0.74
Creatinine, Ser: 0.75
Creatinine, Ser: 0.77
Creatinine, Ser: 0.77
Creatinine, Ser: 0.79
Creatinine, Ser: 0.8
Creatinine, Ser: 0.8
Creatinine, Ser: 0.83
Creatinine, Ser: 0.84
Creatinine, Ser: 0.84
Creatinine, Ser: 0.88
Creatinine, Ser: 0.88
Creatinine, Ser: 0.91
GFR calc Af Amer: >60
GFR calc Af Amer: >60
GFR calc Af Amer: >60
GFR calc Af Amer: >60
GFR calc Af Amer: >60
GFR calc Af Amer: >60
GFR calc Af Amer: >60
GFR calc Af Amer: >60
GFR calc Af Amer: >60
GFR calc Af Amer: >60
GFR calc Af Amer: >60
GFR calc Af Amer: >60
GFR calc Af Amer: >60
GFR calc Af Amer: >60
GFR calc Af Amer: >60
GFR calc Af Amer: >60
GFR calc Af Amer: >60
GFR calc Af Amer: >60
GFR calc Af Amer: >60
GFR calc Af Amer: >60
GFR calc Af Amer: >60
GFR calc Af Amer: >60
GFR calc Af Amer: >60
GFR calc Af Amer: >60
GFR calc Af Amer: >60
GFR calc Af Amer: >60
GFR calc Af Amer: >60
GFR calc Af Amer: >60
GFR calc Af Amer: >60
GFR calc Af Amer: >60
GFR calc Af Amer: >60
GFR calc Af Amer: >60
GFR calc Af Amer: >60
GFR calc non Af Amer: >60
GFR calc non Af Amer: >60
GFR calc non Af Amer: >60
GFR calc non Af Amer: >60
GFR calc non Af Amer: >60
GFR calc non Af Amer: >60
GFR calc non Af Amer: >60
GFR calc non Af Amer: >60
GFR calc non Af Amer: >60
GFR calc non Af Amer: >60
GFR calc non Af Amer: >60
GFR calc non Af Amer: >60
GFR calc non Af Amer: >60
GFR calc non Af Amer: >60
GFR calc non Af Amer: >60
GFR calc non Af Amer: >60
GFR calc non Af Amer: >60
GFR calc non Af Amer: >60
GFR calc non Af Amer: >60
GFR calc non Af Amer: >60
GFR calc non Af Amer: >60
GFR calc non Af Amer: >60
GFR calc non Af Amer: >60
GFR calc non Af Amer: >60
GFR calc non Af Amer: >60
GFR calc non Af Amer: >60
GFR calc non Af Amer: >60
GFR calc non Af Amer: >60
GFR calc non Af Amer: >60
GFR calc non Af Amer: >60
GFR calc non Af Amer: >60
Glucose, Bld: 100 — ABNORMAL HIGH
Glucose, Bld: 101 — ABNORMAL HIGH
Glucose, Bld: 103 — ABNORMAL HIGH
Glucose, Bld: 103 — ABNORMAL HIGH
Glucose, Bld: 104 — ABNORMAL HIGH
Glucose, Bld: 112 — ABNORMAL HIGH
Glucose, Bld: 116 — ABNORMAL HIGH
Glucose, Bld: 120 — ABNORMAL HIGH
Glucose, Bld: 121 — ABNORMAL HIGH
Glucose, Bld: 123 — ABNORMAL HIGH
Glucose, Bld: 125 — ABNORMAL HIGH
Glucose, Bld: 125 — ABNORMAL HIGH
Glucose, Bld: 131 — ABNORMAL HIGH
Glucose, Bld: 136 — ABNORMAL HIGH
Glucose, Bld: 152 — ABNORMAL HIGH
Glucose, Bld: 89
Glucose, Bld: 93
Glucose, Bld: 94
Glucose, Bld: 94
Glucose, Bld: 94
Glucose, Bld: 96
Glucose, Bld: 98
Potassium: 3.1 — ABNORMAL LOW
Potassium: 3.2 — ABNORMAL LOW
Potassium: 3.3 — ABNORMAL LOW
Potassium: 3.4 — ABNORMAL LOW
Potassium: 3.5
Potassium: 3.5
Potassium: 3.5
Potassium: 3.5
Potassium: 3.5
Potassium: 3.6
Potassium: 3.6
Potassium: 3.6
Potassium: 3.6
Potassium: 3.6
Potassium: 3.7
Potassium: 3.7
Potassium: 3.7
Potassium: 3.8
Potassium: 3.9
Potassium: 3.9
Potassium: 3.9
Potassium: 4
Potassium: 4
Potassium: 4
Potassium: 4.1
Potassium: 4.1
Potassium: 4.1
Potassium: 4.1
Potassium: 4.2
Sodium: 114 — CL
Sodium: 118 — CL
Sodium: 119 — CL
Sodium: 119 — CL
Sodium: 119 — CL
Sodium: 120 — ABNORMAL LOW
Sodium: 120 — ABNORMAL LOW
Sodium: 121 — ABNORMAL LOW
Sodium: 121 — ABNORMAL LOW
Sodium: 121 — ABNORMAL LOW
Sodium: 122 — ABNORMAL LOW
Sodium: 123 — ABNORMAL LOW
Sodium: 123 — ABNORMAL LOW
Sodium: 123 — ABNORMAL LOW
Sodium: 124 — ABNORMAL LOW
Sodium: 124 — ABNORMAL LOW
Sodium: 125 — ABNORMAL LOW
Sodium: 125 — ABNORMAL LOW
Sodium: 126 — ABNORMAL LOW
Sodium: 126 — ABNORMAL LOW
Sodium: 126 — ABNORMAL LOW
Sodium: 126 — ABNORMAL LOW
Sodium: 126 — ABNORMAL LOW
Sodium: 127 — ABNORMAL LOW
Sodium: 127 — ABNORMAL LOW
Sodium: 127 — ABNORMAL LOW
Sodium: 127 — ABNORMAL LOW
Sodium: 128 — ABNORMAL LOW
Sodium: 128 — ABNORMAL LOW
Sodium: 128 — ABNORMAL LOW
Sodium: 128 — ABNORMAL LOW
Sodium: 129 — ABNORMAL LOW
Sodium: 129 — ABNORMAL LOW
Sodium: 131 — ABNORMAL LOW
Sodium: 131 — ABNORMAL LOW

## 2010-10-28 LAB — CBC
HCT: 31.3 — ABNORMAL LOW
HCT: 33.4 — ABNORMAL LOW
Hemoglobin: 11.4 — ABNORMAL LOW
MCHC: 34
MCV: 85.6
Platelets: 401 — ABNORMAL HIGH
RBC: 3.96 — ABNORMAL LOW
RDW: 13.9
WBC: 10.7 — ABNORMAL HIGH

## 2010-10-28 LAB — URINALYSIS, ROUTINE W REFLEX MICROSCOPIC
Bilirubin Urine: NEGATIVE
Ketones, ur: NEGATIVE
Nitrite: NEGATIVE
Protein, ur: NEGATIVE
Specific Gravity, Urine: 1.018
Urobilinogen, UA: 0.2
pH: 7

## 2010-10-28 LAB — CORTISOL: Cortisol, Plasma: 18.3

## 2010-10-28 LAB — SODIUM, URINE, TIMED: Sodium, Ur: 153

## 2010-10-31 LAB — ANAEROBIC CULTURE

## 2010-10-31 LAB — BASIC METABOLIC PANEL
BUN: 5 — ABNORMAL LOW
BUN: 5 — ABNORMAL LOW
BUN: 5 — ABNORMAL LOW
BUN: 5 — ABNORMAL LOW
BUN: 6
BUN: 7
BUN: 8
CO2: 25
CO2: 25
CO2: 26
CO2: 26
CO2: 26
CO2: 27
Calcium: 7.9 — ABNORMAL LOW
Calcium: 7.9 — ABNORMAL LOW
Calcium: 8.2 — ABNORMAL LOW
Calcium: 8.2 — ABNORMAL LOW
Calcium: 8.3 — ABNORMAL LOW
Calcium: 8.5
Chloride: 82 — ABNORMAL LOW
Chloride: 84 — ABNORMAL LOW
Chloride: 85 — ABNORMAL LOW
Chloride: 85 — ABNORMAL LOW
Chloride: 86 — ABNORMAL LOW
Chloride: 86 — ABNORMAL LOW
Chloride: 87 — ABNORMAL LOW
Chloride: 92 — ABNORMAL LOW
Chloride: 93 — ABNORMAL LOW
Creatinine, Ser: 0.79
Creatinine, Ser: 0.82
Creatinine, Ser: 0.85
Creatinine, Ser: 0.87
Creatinine, Ser: 0.91
Creatinine, Ser: 0.92
GFR calc Af Amer: >60
GFR calc Af Amer: >60
GFR calc Af Amer: >60
GFR calc Af Amer: >60
GFR calc Af Amer: >60
GFR calc Af Amer: >60
GFR calc Af Amer: >60
GFR calc Af Amer: >60
GFR calc Af Amer: >60
GFR calc non Af Amer: >60
GFR calc non Af Amer: >60
GFR calc non Af Amer: >60
GFR calc non Af Amer: >60
GFR calc non Af Amer: >60
GFR calc non Af Amer: >60
GFR calc non Af Amer: >60
GFR calc non Af Amer: >60
GFR calc non Af Amer: >60
Glucose, Bld: 110 — ABNORMAL HIGH
Glucose, Bld: 121 — ABNORMAL HIGH
Potassium: 3.6
Potassium: 3.7
Potassium: 3.7
Potassium: 3.8
Potassium: 3.8
Potassium: 3.9
Potassium: 4
Potassium: 4
Potassium: 4.1
Potassium: 4.3
Sodium: 116 — CL
Sodium: 116 — CL
Sodium: 119 — CL
Sodium: 119 — CL
Sodium: 121 — ABNORMAL LOW
Sodium: 122 — ABNORMAL LOW
Sodium: 124 — ABNORMAL LOW

## 2010-10-31 LAB — CULTURE, BLOOD (ROUTINE X 2): Culture: NO GROWTH

## 2010-10-31 LAB — CSF CULTURE W GRAM STAIN

## 2010-10-31 LAB — SODIUM: Sodium: 122 — ABNORMAL LOW

## 2010-10-31 LAB — PROTEIN AND GLUCOSE, CSF
Glucose, CSF: 60
Total  Protein, CSF: 23

## 2010-10-31 LAB — DIFFERENTIAL
Basophils Relative: 0
Monocytes Absolute: 1.3 — ABNORMAL HIGH
Monocytes Relative: 11
Neutro Abs: 7.8 — ABNORMAL HIGH

## 2010-10-31 LAB — CSF CELL COUNT WITH DIFFERENTIAL
Lymphs, CSF: 36 — ABNORMAL LOW
Monocyte-Macrophage-Spinal Fluid: 53 — ABNORMAL HIGH
Monocyte-Macrophage-Spinal Fluid: 57 — ABNORMAL HIGH
RBC Count, CSF: 2 — ABNORMAL HIGH
RBC Count, CSF: 5 — ABNORMAL HIGH
Segmented Neutrophils-CSF: 7 — ABNORMAL HIGH
WBC, CSF: 130 — ABNORMAL HIGH
WBC, CSF: 167 — ABNORMAL HIGH

## 2010-10-31 LAB — CBC
HCT: 34.2 — ABNORMAL LOW
Hemoglobin: 12.9 — ABNORMAL LOW
MCHC: 33.6
MCHC: 34.9
MCV: 84.2
Platelets: 324
RBC: 4.48

## 2010-10-31 LAB — SODIUM, URINE, RANDOM
Sodium, Ur: 103
Sodium, Ur: 133

## 2010-10-31 LAB — GRAM STAIN

## 2010-10-31 LAB — OSMOLALITY, URINE
Osmolality, Ur: 421
Osmolality, Ur: 459

## 2010-10-31 LAB — VANCOMYCIN, TROUGH: Vancomycin Tr: 9.2

## 2010-10-31 LAB — URIC ACID: Uric Acid, Serum: 2.6

## 2010-11-01 LAB — OSMOLALITY, URINE: Osmolality, Ur: 255 — ABNORMAL LOW

## 2010-11-01 LAB — BASIC METABOLIC PANEL
BUN: 6
BUN: 6
BUN: 7
BUN: 7
BUN: 8
BUN: 9
BUN: 8
CO2: 23
CO2: 24
CO2: 25
CO2: 27
CO2: 28
CO2: 28
CO2: 29
CO2: 12 — ABNORMAL LOW
CO2: 23
CO2: 23
CO2: 25
Calcium: 8.2 — ABNORMAL LOW
Calcium: 8.4
Calcium: 8.4
Calcium: 8.6
Calcium: 8.7
Calcium: 8.3 — ABNORMAL LOW
Calcium: 8.8
Calcium: 9.4
Chloride: 91 — ABNORMAL LOW
Chloride: 92 — ABNORMAL LOW
Chloride: 94 — ABNORMAL LOW
Chloride: 94 — ABNORMAL LOW
Chloride: 95 — ABNORMAL LOW
Chloride: 84 — ABNORMAL LOW
Chloride: 87 — ABNORMAL LOW
Creatinine, Ser: 0.76
Creatinine, Ser: 0.78
Creatinine, Ser: 0.81
Creatinine, Ser: 0.9
Creatinine, Ser: 0.95
Creatinine, Ser: 1.03
Creatinine, Ser: 0.98
Creatinine, Ser: 1
Creatinine, Ser: 1.04
GFR calc Af Amer: >60
GFR calc Af Amer: >60
GFR calc Af Amer: >60
GFR calc Af Amer: >60
GFR calc Af Amer: >60
GFR calc Af Amer: >60
GFR calc Af Amer: >60
GFR calc Af Amer: >60
GFR calc non Af Amer: >60
GFR calc non Af Amer: >60
GFR calc non Af Amer: >60
GFR calc non Af Amer: >60
GFR calc non Af Amer: >60
GFR calc non Af Amer: >60
Glucose, Bld: 100 — ABNORMAL HIGH
Glucose, Bld: 103 — ABNORMAL HIGH
Glucose, Bld: 104 — ABNORMAL HIGH
Glucose, Bld: 98
Glucose, Bld: 99
Glucose, Bld: 120 — ABNORMAL HIGH
Glucose, Bld: 89
Potassium: 3.6
Potassium: 3.8
Potassium: 3.9
Potassium: 4.2
Potassium: 3.6
Sodium: 124 — ABNORMAL LOW
Sodium: 124 — ABNORMAL LOW
Sodium: 125 — ABNORMAL LOW
Sodium: 125 — ABNORMAL LOW
Sodium: 127 — ABNORMAL LOW
Sodium: 130 — ABNORMAL LOW
Sodium: 116 — CL
Sodium: 129 — ABNORMAL LOW

## 2010-11-01 LAB — CBC
Hemoglobin: 12.9 — ABNORMAL LOW
Hemoglobin: 14.2
MCHC: 34.5
MCHC: 34.3
RBC: 4.45
RDW: 14.6 — ABNORMAL HIGH
WBC: 13.6 — ABNORMAL HIGH

## 2010-11-01 LAB — BLOOD GAS, ARTERIAL
Acid-base deficit: 2.2 — ABNORMAL HIGH
Acid-base deficit: 1.6
Bicarbonate: 21.6
FIO2: 100
MECHVT: 600
O2 Content: 4
Patient temperature: 98.6
TCO2: 22.5
TCO2: 22.6
pCO2 arterial: 31.1 — ABNORMAL LOW
pCO2 arterial: 31.2 — ABNORMAL LOW
pH, Arterial: 7.45
pH, Arterial: 7.454 — ABNORMAL HIGH
pO2, Arterial: 112 — ABNORMAL HIGH
pO2, Arterial: 155 — ABNORMAL HIGH

## 2010-11-01 LAB — I-STAT EC8
BUN: 7
Chloride: 86 — ABNORMAL LOW
HCT: 46
Hemoglobin: 15.6
Operator id: 181071
Potassium: 3.4 — ABNORMAL LOW
Sodium: 118 — CL

## 2010-11-01 LAB — COMPREHENSIVE METABOLIC PANEL
BUN: 7
Calcium: 9.1
Creatinine, Ser: 0.91
Glucose, Bld: 117 — ABNORMAL HIGH
Sodium: 129 — ABNORMAL LOW
Total Protein: 6.7

## 2010-11-01 LAB — POCT I-STAT 3, ART BLOOD GAS (G3+)
Operator id: 181071
Patient temperature: 98.9
TCO2: 16
pH, Arterial: 6.857 — CL

## 2010-11-01 LAB — CSF CELL COUNT WITH DIFFERENTIAL: WBC, CSF: 1

## 2010-11-01 LAB — CHLORIDE, URINE, RANDOM: Chloride Urine: 74

## 2010-11-01 LAB — MAGNESIUM: Magnesium: 2.4

## 2010-11-01 LAB — GLUCOSE, CSF: Glucose, CSF: 68

## 2010-11-01 LAB — SODIUM, URINE, RANDOM: Sodium, Ur: 55

## 2010-11-01 LAB — PHOSPHORUS: Phosphorus: 3

## 2010-12-02 ENCOUNTER — Encounter: Payer: Self-pay | Admitting: *Deleted

## 2010-12-02 ENCOUNTER — Telehealth: Payer: Self-pay | Admitting: Internal Medicine

## 2010-12-02 NOTE — Telephone Encounter (Signed)
I received a call from Jana Half- device rep to clarify time constraints for the patient to be under the MRI scanner. Per Leta Jungling, he can be 30 minutes under the scanner uninterrupted across the chest. If the scan will exceed 30 minutes, he will need to have a 10 minute break, then he can resume another 30 minute interval under the scanner. I have relayed this information to the patient's mother and she will get a fax number for [place] to send a letter to where he is having his MRI done. She will call this back on Monday. I have done a letter in the patient's chart that will just need to be printed and faxed when the fax number is called back.

## 2010-12-02 NOTE — Telephone Encounter (Signed)
Will forward to Dr. Klein. 

## 2010-12-02 NOTE — Telephone Encounter (Signed)
Pt calling stating he needs to get MRI and was told that he needs to get leap recorded turned off. Please return pt call to discuss further.   If pt doesn't answer pt gives permission to discuss with parents.

## 2010-12-02 NOTE — Telephone Encounter (Signed)
There is no contraindication to MR scanning with a implantable loop recorder. There are limitations to the duration to avoid overheating.

## 2010-12-06 ENCOUNTER — Telehealth: Payer: Self-pay | Admitting: Internal Medicine

## 2010-12-06 NOTE — Telephone Encounter (Signed)
I spoke with the patient's mother. She wanted to make sure the exposure time for MRI was specified on the fax. I explained that it was. She states I need to fax this letter to [facility] MRI dept at [phone].

## 2010-12-06 NOTE — Telephone Encounter (Signed)
New problem:  Patient mom  Britta Mccreedy calling status of form for Cardiac  Mri in [place]. Please call before sending fax.

## 2011-02-07 ENCOUNTER — Emergency Department (HOSPITAL_COMMUNITY): Payer: BC Managed Care – PPO

## 2011-02-07 ENCOUNTER — Emergency Department (HOSPITAL_COMMUNITY)
Admission: EM | Admit: 2011-02-07 | Discharge: 2011-02-07 | Disposition: A | Discharge status: Home / Self Care | Payer: BC Managed Care – PPO | Attending: Emergency Medicine | Admitting: Emergency Medicine

## 2011-02-07 ENCOUNTER — Other Ambulatory Visit: Payer: Self-pay

## 2011-02-07 ENCOUNTER — Encounter (HOSPITAL_COMMUNITY): Payer: Self-pay | Admitting: *Deleted

## 2011-02-07 DIAGNOSIS — R42 Dizziness and giddiness: Secondary | ICD-10-CM

## 2011-02-07 DIAGNOSIS — I2510 Atherosclerotic heart disease of native coronary artery without angina pectoris: Secondary | ICD-10-CM | POA: Insufficient documentation

## 2011-02-07 DIAGNOSIS — R112 Nausea with vomiting, unspecified: Secondary | ICD-10-CM | POA: Insufficient documentation

## 2011-02-07 DIAGNOSIS — Z86718 Personal history of other venous thrombosis and embolism: Secondary | ICD-10-CM | POA: Insufficient documentation

## 2011-02-07 DIAGNOSIS — Z982 Presence of cerebrospinal fluid drainage device: Secondary | ICD-10-CM | POA: Insufficient documentation

## 2011-02-07 DIAGNOSIS — Z7982 Long term (current) use of aspirin: Secondary | ICD-10-CM | POA: Insufficient documentation

## 2011-02-07 DIAGNOSIS — G40909 Epilepsy, unspecified, not intractable, without status epilepticus: Secondary | ICD-10-CM | POA: Insufficient documentation

## 2011-02-07 DIAGNOSIS — I4891 Unspecified atrial fibrillation: Secondary | ICD-10-CM | POA: Insufficient documentation

## 2011-02-07 DIAGNOSIS — Z79899 Other long term (current) drug therapy: Secondary | ICD-10-CM | POA: Insufficient documentation

## 2011-02-07 HISTORY — DX: Atherosclerotic heart disease of native coronary artery without angina pectoris: I25.10

## 2011-02-07 LAB — CBC
Hemoglobin: 14.1 g/dL (ref 13.0–17.0)
MCH: 29 pg (ref 26.0–34.0)
MCHC: 34.5 g/dL (ref 30.0–36.0)
MCV: 84.2 fL (ref 78.0–100.0)

## 2011-02-07 LAB — LIPASE, BLOOD: Lipase: 36 U/L (ref 11–59)

## 2011-02-07 LAB — DIFFERENTIAL
Basophils Relative: 0 % (ref 0–1)
Eosinophils Absolute: 0.2 10*3/uL (ref 0.0–0.7)
Eosinophils Relative: 2 % (ref 0–5)
Lymphs Abs: 1.8 10*3/uL (ref 0.7–4.0)
Monocytes Relative: 11 % (ref 3–12)
Neutrophils Relative %: 70 % (ref 43–77)

## 2011-02-07 LAB — COMPREHENSIVE METABOLIC PANEL
Albumin: 3.7 g/dL (ref 3.5–5.2)
Alkaline Phosphatase: 73 U/L (ref 39–117)
BUN: 15 mg/dL (ref 6–23)
Calcium: 9.5 mg/dL (ref 8.4–10.5)
Creatinine, Ser: 0.83 mg/dL (ref 0.50–1.35)
GFR calc Af Amer: >90 mL/min (ref >90)
Glucose, Bld: 101 mg/dL — ABNORMAL HIGH (ref 70–99)
Potassium: 4.3 mEq/L (ref 3.5–5.1)
Total Protein: 7.1 g/dL (ref 6.0–8.3)

## 2011-02-07 MED ORDER — SODIUM CHLORIDE 0.9 % IV BOLUS (SEPSIS)
1000.0000 mL | Freq: Once | INTRAVENOUS | Status: AC
  Administered 2011-02-07: 1000 mL via INTRAVENOUS

## 2011-02-07 MED ORDER — MECLIZINE HCL 25 MG PO TABS: 25.0000 mg | ORAL_TABLET | Freq: Three times a day (TID) | ORAL | Status: DC | PRN

## 2011-02-07 NOTE — ED Notes (Signed)
He woke up this am and had dizziness and he had some nausea also.  He has had this numerus times in the past.  He has a v-p shunt and this is some of the symptoms.  The dizziness has gotten better

## 2011-02-07 NOTE — ED Notes (Signed)
rx x 1, pt voiced understanding to f/u with neurosurgeon tomorrow.

## 2011-02-07 NOTE — ED Provider Notes (Signed)
History     CSN: [id number]  Arrival date & time 02/07/11  1712   First MD Initiated Contact with Patient 02/07/11 1814      Chief Complaint  Patient presents with  . Dizziness    (Consider location/radiation/quality/duration/timing/severity/associated sxs/prior treatment) Patient is a 46 y.o. male presenting with neurologic complaint. The history is provided by the patient. No language interpreter was used.  Neurologic Problem The primary symptoms include dizziness (spinning sensation), nausea and vomiting. Primary symptoms do not include syncope, loss of consciousness, focal weakness, loss of sensation or fever. The symptoms began 6 to 12 hours ago. The symptoms are unchanged. The neurological symptoms are diffuse. The symptoms occurred while sleeping.  He describes the dizziness as a sensation of spinning. The dizziness began today. The dizziness has been unchanged since its onset. It is a recurrent problem. Dizziness also occurs with nausea and vomiting.  Associated medical issues comments: VP shunt.    Past Medical History  Diagnosis Date  . Hydrocephalus   . Seizure   . Chiari I malformation   . Atrial fibrillation   . Cardiomyopathy   . Pulmonary embolism   . SIADH (syndrome of inappropriate ADH production)   . Syncope     Recurrent, thought to be neurally mediated  . Salt-wasting syndrome of infancy   . Coronary artery disease     Past Surgical History  Procedure Date  . Ventriculoperitoneal shunt   . Cholecystectomy   . [facility] dm [id number] loop     S/p loop recorder    History reviewed. No pertinent family history.  History  Substance Use Topics  . Smoking status: Never Smoker   . Smokeless tobacco: Not on file  . Alcohol Use: No      Review of Systems  Constitutional: Negative for fever.  Cardiovascular: Negative for syncope.  Gastrointestinal: Positive for nausea and vomiting.  Neurological: Positive for dizziness (spinning sensation). Negative  for focal weakness and loss of consciousness.  All other systems reviewed and are negative.    Allergies  Prednisone  Home Medications   Current Outpatient Rx  Name Route Sig Dispense Refill  . ASPIRIN 81 MG PO TABS Oral Take 81 mg by mouth daily.      Marland Kitchen LAMOTRIGINE 100 MG PO TABS Oral Take 100 mg by mouth daily.      Marland Kitchen LISINOPRIL 20 MG PO TABS Oral Take 20 mg by mouth daily.    . MELOXICAM 15 MG PO TABS Oral Take 15 mg by mouth daily.    Marland Kitchen PRAMIPEXOLE DIHYDROCHLORIDE 0.125 MG PO TABS Oral Take 0.125 mg by mouth 2 (two) times daily.    . SERTRALINE HCL 100 MG PO TABS Oral Take 100 mg by mouth daily.      . SOD FLUORIDE-POTASSIUM NITRATE 1.1-5 % DT PSTE dental Place 1 application onto teeth 2 (two) times daily.    . SODIUM CHLORIDE 1 G PO TABS Oral Take 2 g by mouth 3 (three) times daily.     Marland Kitchen FLUDROCORTISONE ACETATE 0.1 MG PO TABS Oral Take 0.2 mg by mouth 2 (two) times daily.        BP 149/87  Pulse 67  Temp(Src) 97.9 F (36.6 C) (Oral)  Resp 20  SpO2 100%  Physical Exam  Constitutional: He is oriented to person, place, and time. He appears well-developed and well-nourished. No distress.  HENT:  Head: Normocephalic and atraumatic.  Mouth/Throat: No oropharyngeal exudate.  Eyes: EOM are normal. Pupils are equal, round,  and reactive to light.  Neck: Normal range of motion. Neck supple.  Cardiovascular: Normal rate and regular rhythm.  Exam reveals no friction rub.   No murmur heard. Pulmonary/Chest: Effort normal and breath sounds normal. No respiratory distress. He has no wheezes. He has no rales.  Abdominal: He exhibits no distension. There is no tenderness. There is no rebound.  Musculoskeletal: Normal range of motion. He exhibits no edema.  Neurological: He is alert and oriented to person, place, and time.  Skin: He is not diaphoretic.    ED Course  Procedures (including critical care time)  Labs Reviewed  CBC - Abnormal; Notable for the following:    WBC 10.7  (*)    All other components within normal limits  DIFFERENTIAL - Abnormal; Notable for the following:    Monocytes Absolute 1.2 (*)    All other components within normal limits  COMPREHENSIVE METABOLIC PANEL - Abnormal; Notable for the following:    Sodium 133 (*)    Glucose, Bld 101 (*)    All other components within normal limits  LIPASE, BLOOD  PRO B NATRIURETIC PEPTIDE   Dg Skull 1-3 Views  02/07/2011  *RADIOLOGY REPORT*  Clinical Data: Verify shunt tubing position  SKULL - 1-3 VIEW  Comparison: CT 09/28/2010  Findings: Bilateral ventricular shunt catheters are in place. Tubing is contiguous in its visualized aspects.  No osseous abnormality.  IMPRESSION: Contiguous shunt tubing bilaterally.  Original Report Authenticated By: Harrel Lemon, M.D.   Dg Chest 2 View  02/07/2011  *RADIOLOGY REPORT*  Clinical Data: Dizziness  CHEST - 2 VIEW  Comparison: 11/26/2009  Findings: Normal heart size and vascularity.  Loop recorder over the left chest.  Shunt tubing traverses both sides of the chest. No focal pneumonia, collapse, consolidation, effusion, pneumothorax.  Trachea midline.  No significant interval change.  IMPRESSION: Stable chest finding.  No acute superimposed process  Original Report Authenticated By: Judie Petit. Ruel Favors, M.D.   Dg Abd 1 View  02/07/2011  *RADIOLOGY REPORT*  Clinical Data: Dizziness, verify shunt position  ABDOMEN - 1 VIEW  Comparison: None.  Findings: Shunt tubing is noted over the abdomen bilaterally, contiguous in its visualized aspects. Cholecystectomy clips noted. Presumed bilateral renal calculi noted.  Normal bowel gas pattern. No displacement of bowel loops is seen to suggest fluid collection. Presence or absence of air fluid levels or free air cannot be assessed on this single supine view. No acute osseous finding.  IMPRESSION: Contiguous shunt tubing with normal bowel gas pattern.  Original Report Authenticated By: Harrel Lemon, M.D.   Ct Head Wo  Contrast  02/07/2011  *RADIOLOGY REPORT*  Clinical Data: Dizziness  CT HEAD WITHOUT CONTRAST  Technique:  Contiguous axial images were obtained from the base of the skull through the vertex without contrast.  Comparison: 09/28/2010  Findings: Stable small subdural fluid collections.  Bilateral frontal approach with the shunt catheters remain in place.  When measured at the same anatomic level there has been interval increase in ventricular diameter, previously 7.3 cm at the level of the anterior horns of the lateral ventricles, currently 8.1 cm.  No midline shift.  No acute infarction, hemorrhage, or mass lesion. No acute osseous abnormality.  Orbits and paranasal sinuses are grossly intact.  IMPRESSION: Increased ventriculomegaly since prior exam with otherwise stable findings.  Original Report Authenticated By: Harrel Lemon, M.D.     1. Vertigo      Date: 02/07/2011  Rate: 70  Rhythm: normal sinus rhythm  QRS Axis: normal  Intervals: normal  ST/T Wave abnormalities: normal  Conduction Disutrbances:none  Narrative Interpretation:   Old EKG Reviewed: unchanged    MDM  Patient is a 51 are old male presenting with dizziness, nausea, vomiting. Began this morning while sleeping. No inciting, alleviating, exacerbating factors. Describe a sensation of spinning. Patient has history of VP shunts. Spoke with his neurosurgeon who instructed him to come to the ED. Patient also has history of salt wasting syndrome, A. Fib. He denies chest pain, shortness of breath. Vitals are stable. Patient is neuro intact, no coordination issues, weakness, sensory defects. Did not walk him secondary to his dizziness. Concern for a shoe with one of his VP shunts. Will obtain CT head and shunt series. We'll also check other labs to check for electrolyte abnormality.  Labs unremarkable. Shunt series normal. Head CT shows him ventriculomegaly but no unstable findings. Spoke with Dr. Venetia Maxon, neurosurgeon on call for  patient's primary neurosurgeon, who stated he talked with Dr. Jeral Fruit concerning the CT and findings. Dr. Venetia Maxon stated he was informed this patient has chronic vertigo and has had extensive workup for it by ENT. Neurosurgery recommended medication to help with vertigo and then follow up with Dr. Jeral Fruit tomorrow in the office. Patient given meclizine prescription and discharged home.        Elwin Mocha, MD 02/07/11 2041

## 2011-02-08 NOTE — ED Provider Notes (Signed)
I saw and evaluated the patient, reviewed the resident's note and I agree with the findings and plan.   Neuro exam unremarkable. Feels well here without intervention. Resident discussed CT findings with neurosurgery Dr. Venetia Maxon who discussed with patients nsg. They are not concerned for obstruction. Pt with history of similar with extensive w/u in past including ENT consultation and no cause found. Recommended home with meclizine and Nsgy f/u.  Forbes Cellar, MD 02/08/11 1023

## 2011-02-16 ENCOUNTER — Emergency Department (HOSPITAL_COMMUNITY): Payer: BC Managed Care – PPO

## 2011-02-16 ENCOUNTER — Encounter (HOSPITAL_COMMUNITY): Payer: Self-pay | Admitting: Emergency Medicine

## 2011-02-16 ENCOUNTER — Emergency Department (HOSPITAL_COMMUNITY)
Admission: EM | Admit: 2011-02-16 | Discharge: 2011-02-17 | Disposition: A | Discharge status: Home / Self Care | Payer: BC Managed Care – PPO | Attending: Emergency Medicine | Admitting: Emergency Medicine

## 2011-02-16 DIAGNOSIS — E236 Other disorders of pituitary gland: Secondary | ICD-10-CM | POA: Insufficient documentation

## 2011-02-16 DIAGNOSIS — G319 Degenerative disease of nervous system, unspecified: Secondary | ICD-10-CM | POA: Insufficient documentation

## 2011-02-16 DIAGNOSIS — Z982 Presence of cerebrospinal fluid drainage device: Secondary | ICD-10-CM | POA: Insufficient documentation

## 2011-02-16 DIAGNOSIS — G9389 Other specified disorders of brain: Secondary | ICD-10-CM | POA: Insufficient documentation

## 2011-02-16 DIAGNOSIS — G919 Hydrocephalus, unspecified: Secondary | ICD-10-CM

## 2011-02-16 DIAGNOSIS — R569 Unspecified convulsions: Secondary | ICD-10-CM | POA: Insufficient documentation

## 2011-02-16 DIAGNOSIS — I2510 Atherosclerotic heart disease of native coronary artery without angina pectoris: Secondary | ICD-10-CM | POA: Insufficient documentation

## 2011-02-16 DIAGNOSIS — I4891 Unspecified atrial fibrillation: Secondary | ICD-10-CM | POA: Insufficient documentation

## 2011-02-16 DIAGNOSIS — F29 Unspecified psychosis not due to a substance or known physiological condition: Secondary | ICD-10-CM | POA: Insufficient documentation

## 2011-02-16 HISTORY — DX: Unspecified convulsions: R56.9

## 2011-02-16 LAB — COMPREHENSIVE METABOLIC PANEL
Alkaline Phosphatase: 81 U/L (ref 39–117)
BUN: 16 mg/dL (ref 6–23)
GFR calc Af Amer: >90 mL/min (ref >90)
GFR calc non Af Amer: >90 mL/min (ref >90)
Glucose, Bld: 119 mg/dL — ABNORMAL HIGH (ref 70–99)
Potassium: 4.5 mEq/L (ref 3.5–5.1)
Total Protein: 7.7 g/dL (ref 6.0–8.3)

## 2011-02-16 LAB — CBC
HCT: 44.5 % (ref 39.0–52.0)
Hemoglobin: 15.4 g/dL (ref 13.0–17.0)
RBC: 5.26 MIL/uL (ref 4.22–5.81)
WBC: 14 10*3/uL — ABNORMAL HIGH (ref 4.0–10.5)

## 2011-02-16 LAB — DIFFERENTIAL
Eosinophils Absolute: 0.5 10*3/uL (ref 0.0–0.7)
Eosinophils Relative: 3 % (ref 0–5)
Lymphs Abs: 3 10*3/uL (ref 0.7–4.0)
Monocytes Relative: 8 % (ref 3–12)
Neutrophils Relative %: 68 % (ref 43–77)

## 2011-02-16 NOTE — ED Notes (Signed)
Back from CT.  NAD, calm, alert, interactive.  wife at [facility].

## 2011-02-16 NOTE — ED Provider Notes (Signed)
History     CSN: [id number]  Arrival date & time 02/16/11  2042   First MD Initiated Contact with Patient 02/16/11 2055      Chief Complaint  Patient presents with  . Seizures    (Consider location/radiation/quality/duration/timing/severity/associated sxs/prior treatment) Patient is a 46 y.o. male presenting with seizures. The history is provided by the patient.  Seizures  This is a new problem. The current episode started 1 to 2 hours ago. Pertinent negatives include no headaches and no speech difficulty.  Pt states he was sitting in a chair when suddenly he began to feel like his jaw was shaking. States in his mind he was trying to get his wives attention and snap his finger and move his leg, but instead according to his wife, his eyes rolled back and he began shaking upper and lower body and well as opening and closing his mouth. Pt was not responding. Pt was confused after the episode, and was very angry, violent. Wife and son brought pt in. Pt has history of two shunts for hydrocephalus. States just had one adjusted yesterday by his neurosurgeon for his dizziness symptoms. Pt denies fever, headache, nausea, vomiting, dizziness. No complaints. Hx of the same several years ago after a shunt placement.  Past Medical History  Diagnosis Date  . Hydrocephalus   . Seizure   . Chiari I malformation   . Atrial fibrillation   . Cardiomyopathy   . Pulmonary embolism   . SIADH (syndrome of inappropriate ADH production)   . Syncope     Recurrent, thought to be neurally mediated  . Salt-wasting syndrome of infancy   . Coronary artery disease   . Seizures     Past Surgical History  Procedure Date  . Ventriculoperitoneal shunt   . Cholecystectomy   . [facility] dm [id number] loop     S/p loop recorder    No family history on file.  History  Substance Use Topics  . Smoking status: Never Smoker   . Smokeless tobacco: Not on file  . Alcohol Use: No      Review of Systems    Constitutional: Negative for fever, chills and diaphoresis.  HENT: Negative.   Eyes: Negative.   Respiratory: Negative.   Cardiovascular: Negative.   Gastrointestinal: Negative.   Genitourinary: Negative.   Neurological: Positive for seizures. Negative for dizziness, facial asymmetry, speech difficulty, weakness, light-headedness and headaches.  Psychiatric/Behavioral: Negative.     Allergies  Prednisone  Home Medications   Current Outpatient Rx  Name Route Sig Dispense Refill  . ASPIRIN 81 MG PO TABS Oral Take 81 mg by mouth daily.      Marland Kitchen FLUDROCORTISONE ACETATE 0.1 MG PO TABS Oral Take 0.2 mg by mouth 2 (two) times daily.     Marland Kitchen LAMOTRIGINE 100 MG PO TABS Oral Take 250 mg by mouth daily.     Marland Kitchen LISINOPRIL 20 MG PO TABS Oral Take 20 mg by mouth daily.    . MELOXICAM 15 MG PO TABS Oral Take 15 mg by mouth daily.    Marland Kitchen PRAMIPEXOLE DIHYDROCHLORIDE 0.125 MG PO TABS Oral Take 0.125 mg by mouth 2 (two) times daily.    . SERTRALINE HCL 100 MG PO TABS Oral Take 100 mg by mouth daily.     . SERTRALINE HCL 25 MG PO TABS Oral Take 25 mg by mouth daily. Takes 125mg  each day    . SOD FLUORIDE-POTASSIUM NITRATE 1.1-5 % DT PSTE dental Place 1 application onto teeth 2 (  two) times daily.    . SODIUM CHLORIDE 1 G PO TABS Oral Take 2 g by mouth 3 (three) times daily.     . TRIAMCINOLONE ACETONIDE 0.025 % EX CREA Topical Apply 1 application topically 3 (three) times daily.      BP 132/86  Pulse 77  Temp(Src) 97.8 F (36.6 C) (Oral)  Resp 18  SpO2 100%  Physical Exam  Nursing note and vitals reviewed. Constitutional: He is oriented to person, place, and time. He appears well-developed and well-nourished. No distress.  HENT:  Head: Normocephalic and atraumatic.  Eyes: Conjunctivae and EOM are normal. Pupils are equal, round, and reactive to light.  Neck: Normal range of motion. Neck supple.  Cardiovascular: Normal rate, regular rhythm and normal heart sounds.   Pulmonary/Chest: Effort normal  and breath sounds normal. No respiratory distress.  Abdominal: Soft. Bowel sounds are normal. He exhibits no distension. There is no tenderness.  Musculoskeletal: Normal range of motion.  Neurological: He is alert and oriented to person, place, and time. He has normal reflexes. He displays normal reflexes. No cranial nerve deficit. He exhibits normal muscle tone. Coordination normal.       Equal bilateral grip strength, no pronator drift, normal finger to nose  Skin: Skin is warm and dry.  Psychiatric: He has a normal mood and affect.    ED Course  Procedures (including critical care time)  Labs Reviewed  CBC - Abnormal; Notable for the following:    WBC 14.0 (*)    All other components within normal limits  DIFFERENTIAL - Abnormal; Notable for the following:    Neutro Abs 9.5 (*)    Monocytes Absolute 1.1 (*)    All other components within normal limits  COMPREHENSIVE METABOLIC PANEL   Results for orders placed during the hospital encounter of 02/16/11  CBC      Component Value Range   WBC 14.0 (*) 4.0 - 10.5 (K/uL)   RBC 5.26  4.22 - 5.81 (MIL/uL)   Hemoglobin 15.4  13.0 - 17.0 (g/dL)   HCT 45.4  09.8 - 11.9 (%)   MCV 84.6  78.0 - 100.0 (fL)   MCH 29.3  26.0 - 34.0 (pg)   MCHC 34.6  30.0 - 36.0 (g/dL)   RDW 14.7  82.9 - 56.2 (%)   Platelets 340  150 - 400 (K/uL)  DIFFERENTIAL      Component Value Range   Neutrophils Relative 68  43 - 77 (%)   Neutro Abs 9.5 (*) 1.7 - 7.7 (K/uL)   Lymphocytes Relative 21  12 - 46 (%)   Lymphs Abs 3.0  0.7 - 4.0 (K/uL)   Monocytes Relative 8  3 - 12 (%)   Monocytes Absolute 1.1 (*) 0.1 - 1.0 (K/uL)   Eosinophils Relative 3  0 - 5 (%)   Eosinophils Absolute 0.5  0.0 - 0.7 (K/uL)   Basophils Relative 0  0 - 1 (%)   Basophils Absolute 0.0  0.0 - 0.1 (K/uL)  COMPREHENSIVE METABOLIC PANEL      Component Value Range   Sodium 133 (*) 135 - 145 (mEq/L)   Potassium 4.5  3.5 - 5.1 (mEq/L)   Chloride 96  96 - 112 (mEq/L)   CO2 27  19 - 32  (mEq/L)   Glucose, Bld 119 (*) 70 - 99 (mg/dL)   BUN 16  6 - 23 (mg/dL)   Creatinine, Ser 1.30  0.50 - 1.35 (mg/dL)   Calcium 86.5  8.4 - 10.5 (  mg/dL)   Total Protein 7.7  6.0 - 8.3 (g/dL)   Albumin 4.0  3.5 - 5.2 (g/dL)   AST 30  0 - 37 (U/L)   ALT 38  0 - 53 (U/L)   Alkaline Phosphatase 81  39 - 117 (U/L)   Total Bilirubin 0.4  0.3 - 1.2 (mg/dL)   GFR calc non Af Amer >90  >90 (mL/min)   GFR calc Af Amer >90  >90 (mL/min)   Dg Skull 1-3 Views  02/16/2011  *RADIOLOGY REPORT*  Clinical Data: Seizure.  Patient with ventriculostomy shunt catheters.  Status post recent shunt adjustment.  SKULL - 1-3 VIEW  Comparison: Two views of the skull 02/07/2011.  Findings: Bilateral ventriculostomy shunt catheters remain in place and unchanged in position.  Catheters appear intact.  IMPRESSION: Shunt catheters appear intact and unchanged.  Original Report Authenticated By: Bernadene Bell. Maricela Curet, M.D.     Dg Chest 1 View  02/16/2011  *RADIOLOGY REPORT*  Clinical Data: Status post seizure.  Patient with ventriculostomy shunt catheters.  The catheters were adjusted two days ago.  CHEST - 1 VIEW  Comparison: Plain films of the chest 02/07/2011.  Findings: Bilateral shunt catheters are again seen and intact. Lungs are clear.  Heart size is normal.  No pneumothorax or effusion.  Loop recorder over the upper left chest again noted.  IMPRESSION: Shunt catheters intact.  No acute finding.  Original Report Authenticated By: Bernadene Bell. D'ALESSIO, M.D.    Dg Abd 1 View  02/16/2011  *RADIOLOGY REPORT*  Clinical Data: Status post seizure.  Patient with ventriculostomy shunt catheters which were adjusted 2 days ago.  ABDOMEN - 1 VIEW  Comparison: Two views the abdomen 02/07/2011.  Findings: Bilateral shunt catheters are seen and appear intact. The right catheter's tip projects over the left sacrum.  Right catheter tip projects over the right ilium.  Catheter position is slightly changed.  Bilateral renal stones are again  noted.  IMPRESSION: Ventriculostomy shunt catheters appear intact.  No acute finding.  Original Report Authenticated By: Bernadene Bell. Maricela Curet, M.D.    Ct Head Wo Contrast  02/16/2011  *RADIOLOGY REPORT*  Clinical Data: Seizure.  CT HEAD WITHOUT CONTRAST  Technique:  Contiguous axial images were obtained from the base of the skull through the vertex without contrast.  Comparison: CT of the head performed 02/07/2011  Findings: There is no evidence of acute infarction, mass lesion, or intra- or extra-axial hemorrhage on CT.  Marked supratentorial ventriculomegaly is essentially unchanged from the recent prior study.  Bilateral frontal ventriculoperitoneal shunts are stable in appearance.  Small bilateral subdural fluid collections are also unchanged in appearance, without evidence of acute hemorrhage.  No significant midline shift is seen.  The cerebellum and brainstem are within normal limits; the fourth ventricle is relatively diminutive.  The cerebral hemispheres demonstrate grossly normal gray-white differentiation.  No mass effect or midline shift is seen.  There is no evidence of fracture; visualized osseous structures are unremarkable in appearance.  The visualized portions of the orbits are within normal limits.  The paranasal sinuses and mastoid air cells are well-aerated.  No significant soft tissue abnormalities are seen.  A large amount of cerumen is noted filling both external auditory canals.  IMPRESSION:  1.  Stable appearance to marked supratentorial ventriculomegaly; bilateral frontal ventriculoperitoneal shunts are also stable in appearance.  Small bilateral subdural fluid collections again seen. 2.  Large amount of cerumen noted filling both external auditory canals.  Original Report Authenticated By: Tonia Ghent,  M.D.    Spoke with Dr. Phoebe Perch, on call for neurosurgery. Advised to consult neurology. Does not think this seizure episode is necessarily related to hydrocephalus. No further  neurosurgical treatment necessary, instructed to follow up with Dr. Jeral Fruit in the office as soon as able.   Spoke with Dr. Roseanne Reno, neurology. Advised to start on Keppra, iv 500mg  in ED, and d/c home with 500mg  BID. Instructed to follow up with neurology in the next week.   Pt in NAD, vs normal, agrees with the plan. Wife by bedside. Will d/c home.   No diagnosis found.    MDM          Lottie Mussel, PA 02/17/11 [phone]

## 2011-02-16 NOTE — ED Notes (Signed)
PT. REPORTS SEIZURE AT HOME THIS EVENING WHILE WATCHING TV , DENIES INJURY , RESPIRATIONS UNLABORED , NO PAIN  OR DISCOMFORT - ALERT/ORIENTED.

## 2011-02-16 NOTE — ED Notes (Signed)
Patient transported to CT 

## 2011-02-17 MED ORDER — SODIUM CHLORIDE 0.9 % IV SOLN
500.0000 mg | INTRAVENOUS | Status: AC
  Administered 2011-02-17: 500 mg via INTRAVENOUS
  Filled 2011-02-17: qty 5

## 2011-02-17 MED ORDER — LEVETIRACETAM 500 MG PO TABS: 500.0000 mg | ORAL_TABLET | Freq: Two times a day (BID) | ORAL | Status: DC

## 2011-02-17 NOTE — ED Notes (Signed)
Family at bedside. 

## 2011-02-17 NOTE — ED Notes (Signed)
Patient is AOx4 and understands his discharge instructions. 

## 2011-02-17 NOTE — ED Notes (Signed)
Patient is resting comfortably. 

## 2011-02-18 NOTE — ED Provider Notes (Signed)
Medical screening examination/treatment/procedure(s) were performed by non-physician practitioner and as supervising physician I was immediately available for consultation/collaboration.   Sharron Simpson, MD 02/18/11 0708 

## 2011-08-06 ENCOUNTER — Emergency Department (HOSPITAL_COMMUNITY): Payer: BC Managed Care – PPO

## 2011-08-06 ENCOUNTER — Encounter (HOSPITAL_COMMUNITY): Payer: Self-pay | Admitting: Emergency Medicine

## 2011-08-06 ENCOUNTER — Emergency Department (HOSPITAL_COMMUNITY)
Admission: EM | Admit: 2011-08-06 | Discharge: 2011-08-07 | Disposition: A | Discharge status: Home / Self Care | Payer: BC Managed Care – PPO | Attending: Emergency Medicine | Admitting: Emergency Medicine

## 2011-08-06 DIAGNOSIS — Z7982 Long term (current) use of aspirin: Secondary | ICD-10-CM | POA: Insufficient documentation

## 2011-08-06 DIAGNOSIS — G40909 Epilepsy, unspecified, not intractable, without status epilepticus: Secondary | ICD-10-CM | POA: Insufficient documentation

## 2011-08-06 DIAGNOSIS — Z86718 Personal history of other venous thrombosis and embolism: Secondary | ICD-10-CM | POA: Insufficient documentation

## 2011-08-06 DIAGNOSIS — I2510 Atherosclerotic heart disease of native coronary artery without angina pectoris: Secondary | ICD-10-CM | POA: Insufficient documentation

## 2011-08-06 DIAGNOSIS — Z79899 Other long term (current) drug therapy: Secondary | ICD-10-CM | POA: Insufficient documentation

## 2011-08-06 DIAGNOSIS — K5900 Constipation, unspecified: Secondary | ICD-10-CM | POA: Insufficient documentation

## 2011-08-06 DIAGNOSIS — I4891 Unspecified atrial fibrillation: Secondary | ICD-10-CM | POA: Insufficient documentation

## 2011-08-06 DIAGNOSIS — R112 Nausea with vomiting, unspecified: Secondary | ICD-10-CM | POA: Insufficient documentation

## 2011-08-06 DIAGNOSIS — Z982 Presence of cerebrospinal fluid drainage device: Secondary | ICD-10-CM | POA: Insufficient documentation

## 2011-08-06 NOTE — ED Provider Notes (Addendum)
History     CSN: [id number]  Arrival date & time 08/06/11  2057   First MD Initiated Contact with Patient 08/06/11 2301      Chief Complaint  Patient presents with  . Illegal value: [    Possible dislodged VP shunt Dr Jeral Fruit is physician    (Consider location/radiation/quality/duration/timing/severity/associated sxs/prior treatment) Patient is a 46 y.o. male presenting with vomiting. The history is provided by the patient and the spouse. No language interpreter was used.  Emesis  This is a recurrent problem. The current episode started more than 2 days ago. The problem occurs 2 to 4 times per day. The problem has not changed since onset.The emesis has an appearance of stomach contents. There has been no fever. Pertinent negatives include no abdominal pain, no arthralgias, no chills, no cough, no diarrhea, no fever, no headaches, no myalgias, no sweats and no URI.  Also has outpouching of surgical scar in LLQ of the abdomen and states he had this the last time his shunt needed to be replaced in 2010.  It was blocked and infected at that time and symptoms are similar  Past Medical History  Diagnosis Date  . Hydrocephalus   . Seizure   . Chiari I malformation   . Atrial fibrillation   . Cardiomyopathy   . Pulmonary embolism   . SIADH (syndrome of inappropriate ADH production)   . Syncope     Recurrent, thought to be neurally mediated  . Salt-wasting syndrome of infancy   . Coronary artery disease   . Seizures     Past Surgical History  Procedure Date  . Ventriculoperitoneal shunt   . Cholecystectomy   . [facility] dm [id number] loop     S/p loop recorder    History reviewed. No pertinent family history.  History  Substance Use Topics  . Smoking status: Never Smoker   . Smokeless tobacco: Not on file  . Alcohol Use: No      Review of Systems  Constitutional: Negative for fever and chills.  Respiratory: Negative for cough.   Gastrointestinal: Positive for nausea and  vomiting. Negative for abdominal pain and diarrhea.  Musculoskeletal: Negative for myalgias and arthralgias.  Neurological: Negative for headaches.  All other systems reviewed and are negative.    Allergies  Prednisone  Home Medications   Current Outpatient Rx  Name Route Sig Dispense Refill  . ASPIRIN 81 MG PO TABS Oral Take 81 mg by mouth daily.      Marland Kitchen FLUDROCORTISONE ACETATE 0.1 MG PO TABS Oral Take 0.2 mg by mouth 2 (two) times daily.     Marland Kitchen LISINOPRIL 20 MG PO TABS Oral Take 20 mg by mouth daily.    . MELOXICAM 15 MG PO TABS Oral Take 15 mg by mouth daily.    Marland Kitchen PRAMIPEXOLE DIHYDROCHLORIDE 0.125 MG PO TABS Oral Take 0.125 mg by mouth 2 (two) times daily.    . SOD FLUORIDE-POTASSIUM NITRATE 1.1-5 % DT PSTE dental Place 1 application onto teeth 2 (two) times daily.    . SODIUM CHLORIDE 1 G PO TABS Oral Take 4 g by mouth 3 (three) times daily.     . TRIAMCINOLONE ACETONIDE 0.025 % EX CREA Topical Apply 1 application topically 3 (three) times daily.      BP 142/83  Pulse 67  Temp 98 F (36.7 C) (Oral)  Resp 18  SpO2 100%  Physical Exam  Constitutional: He is oriented to person, place, and time. He appears well-developed and well-nourished.  HENT:  Head: Normocephalic and atraumatic.  Right Ear: External ear normal.  Left Ear: External ear normal.  Mouth/Throat: Oropharynx is clear and moist.  Eyes: Conjunctivae and EOM are normal. Pupils are equal, round, and reactive to light.  Neck: Normal range of motion. Neck supple.  Cardiovascular: Normal rate and regular rhythm.   Pulmonary/Chest: Effort normal and breath sounds normal. He has no wheezes. He has no rales.  Abdominal: Soft. Bowel sounds are normal. There is no tenderness. There is no rebound and no guarding.       Mild elevation of LLQ surgical scar  Musculoskeletal: Normal range of motion. He exhibits no edema.  Neurological: He is alert and oriented to person, place, and time. He has normal reflexes.  Skin: Skin  is warm and dry.  Psychiatric: He has a normal mood and affect.    ED Course  Procedures (including critical care time)   Labs Reviewed  CBC WITH DIFFERENTIAL  PROTIME-INR   No results found.   No diagnosis found.    MDM  Case d/w Dr. Andria Meuse of radiology, no phlegmon abscess nor cellulitis nor any finding in the skin and subcutaneous tissues of the LLQ  507 case d/w Dr. Wynetta Emery, with normal labs and contiguous shunt, patient safe for discharge follow up in the office,  Stool softeners    Return for headaches seizures fevers or rashes of the skin or any concerns    Paige Vanderwoude K Naydeen Speirs-Rasch, MD 08/07/11 0517  Dequann Vandervelden K Kirston Luty-Rasch, MD 08/07/11 [phone]

## 2011-08-06 NOTE — ED Notes (Signed)
Patient up to restroom independently.

## 2011-08-06 NOTE — ED Notes (Addendum)
Pt reports possible dislodge VP shunt has had same in the past and symptoms are the same as before. Pt is seen by Dr Jeral Fruit who placed the VP shunt in 2011

## 2011-08-06 NOTE — ED Notes (Signed)
Patient transported to radiology

## 2011-08-07 ENCOUNTER — Emergency Department (HOSPITAL_COMMUNITY): Payer: BC Managed Care – PPO

## 2011-08-07 LAB — CBC WITH DIFFERENTIAL/PLATELET
Basophils Absolute: 0.1 10*3/uL (ref 0.0–0.1)
HCT: 40 % (ref 39.0–52.0)
Lymphocytes Relative: 25 % (ref 12–46)
Monocytes Absolute: 1.2 10*3/uL — ABNORMAL HIGH (ref 0.1–1.0)
Neutro Abs: 5.9 10*3/uL (ref 1.7–7.7)
Neutrophils Relative %: 59 % (ref 43–77)
Platelets: 304 10*3/uL (ref 150–400)
RDW: 14.1 % (ref 11.5–15.5)
WBC: 10 10*3/uL (ref 4.0–10.5)

## 2011-08-07 LAB — POCT I-STAT, CHEM 8
HCT: 41 % (ref 39.0–52.0)
Hemoglobin: 13.9 g/dL (ref 13.0–17.0)
Potassium: 4.1 mEq/L (ref 3.5–5.1)
Sodium: 137 mEq/L (ref 135–145)
TCO2: 25 mmol/L (ref 0–100)

## 2011-08-07 LAB — PROTIME-INR
INR: 1.02 (ref 0.00–1.49)
Prothrombin Time: 13.6 seconds (ref 11.6–15.2)

## 2011-08-07 MED ORDER — IOHEXOL 300 MG/ML  SOLN
100.0000 mL | Freq: Once | INTRAMUSCULAR | Status: AC | PRN
  Administered 2011-08-07: 100 mL via INTRAVENOUS

## 2011-08-07 MED ORDER — ONDANSETRON HCL 4 MG/2ML IJ SOLN
4.0000 mg | Freq: Once | INTRAMUSCULAR | Status: AC
  Administered 2011-08-07: 4 mg via INTRAVENOUS
  Filled 2011-08-07: qty 2

## 2011-10-05 ENCOUNTER — Encounter: Payer: Self-pay | Admitting: Internal Medicine

## 2011-10-05 ENCOUNTER — Ambulatory Visit (INDEPENDENT_AMBULATORY_CARE_PROVIDER_SITE_OTHER): Payer: BC Managed Care – PPO | Admitting: Internal Medicine

## 2011-10-05 VITALS — BP 149/92 | HR 72 | Ht 77.5 in | Wt 300.8 lb

## 2011-10-05 DIAGNOSIS — Z959 Presence of cardiac and vascular implant and graft, unspecified: Secondary | ICD-10-CM | POA: Insufficient documentation

## 2011-10-05 DIAGNOSIS — I4891 Unspecified atrial fibrillation: Secondary | ICD-10-CM

## 2011-10-05 DIAGNOSIS — R55 Syncope and collapse: Secondary | ICD-10-CM

## 2011-10-05 LAB — PACEMAKER DEVICE OBSERVATION: DEVICE MODEL PM: 2354802

## 2011-10-05 NOTE — Progress Notes (Signed)
Patient Care Team: Everlean Cherry, MD as PCP - General (Family Medicine)   HPI  [name] is a 45 y.o. male Seen in followup for syncope thought to be neurally mediated. His history of her previously implanted loop recorder. He also has a history of a salt wasting syndrome  He also has a history of atrial fibrillation  He has had no intercurrent syncope. Overall he is feeling quite well. He has had some flushing issues associated with diaphoresis. He is followed by endocrinology in [facility].  Past Medical History  Diagnosis Date  . Hydrocephalus   . Seizure   . Chiari I malformation   . Atrial fibrillation   . Cardiomyopathy   . Pulmonary embolism   . SIADH (syndrome of inappropriate ADH production)   . Syncope     Recurrent, thought to be neurally mediated  . Salt-wasting syndrome of infancy   . Coronary artery disease   . Seizures     Past Surgical History  Procedure Date  . Ventriculoperitoneal shunt   . Cholecystectomy   . [facility] dm [id number] loop     S/p loop recorder    Current Outpatient Prescriptions  Medication Sig Dispense Refill  . aspirin 81 MG tablet Take 81 mg by mouth daily.        . fludrocortisone (FLORINEF) 0.1 MG tablet Take 0.2 mg by mouth 2 (two) times daily.       Marland Kitchen lisinopril (PRINIVIL,ZESTRIL) 20 MG tablet Take 20 mg by mouth daily.      . meloxicam (MOBIC) 15 MG tablet Take 15 mg by mouth daily.      . pramipexole (MIRAPEX) 0.125 MG tablet Take 0.125 mg by mouth 2 (two) times daily.      . Sod Fluoride-Potassium Nitrate 1.1-5 % PSTE Place 1 application onto teeth 2 (two) times daily.      . sodium chloride 1 G tablet Take 4 g by mouth 3 (three) times daily.       Marland Kitchen triamcinolone (KENALOG) 0.025 % cream Apply 1 application topically 3 (three) times daily.        Allergies  Allergen Reactions  . Omnipaque (Iohexol)   . Prednisone Nausea And Vomiting and Other (See Comments)    unknown    Review of Systems negative except from  HPI and PMH  Physical Exam BP 149/92  Pulse 72  Ht 6' 5.5" (1.969 m)  Wt 300 lb 12.8 oz (136.442 kg)  BMI 35.21 kg/m2  SpO2 99% Well developed and well nourished in no acute distress HENT normal E scleral and icterus clear Neck Supple JVP flat; carotids brisk and full Clear to ausculation Regular rate and rhythm, no murmurs gallops or rub Soft with active bowel sounds No clubbing cyanosis none Edema Alert and oriented, grossly normal motor and sensory function Skin Warm and Dry  Implantable loop recorder demonstrates no significant arrhythmias. There are multiple artifactual storages  Assessment and  Plan

## 2011-10-05 NOTE — Assessment & Plan Note (Signed)
No intercurrent syncope 

## 2011-10-05 NOTE — Patient Instructions (Addendum)
Your physician recommends that you schedule a follow-up appointment in: 3 months in the device clinic with Belenda Cruise and [place].  Your physician wants you to follow-up in: June or July 2014 with Dr. Graciela Husbands.  You will receive a reminder letter in the mail two months in advance. If you don't receive a letter, please call our office to schedule the follow-up appointment.

## 2011-10-05 NOTE — Assessment & Plan Note (Signed)
No atrial fibrillation detected on his monitor

## 2011-10-05 NOTE — Assessment & Plan Note (Signed)
The patient's device was interrogated.  The information was reviewed. No changes were made in the programming.    

## 2012-01-03 DIAGNOSIS — F3289 Other specified depressive episodes: Secondary | ICD-10-CM | POA: Insufficient documentation

## 2012-01-03 DIAGNOSIS — G479 Sleep disorder, unspecified: Secondary | ICD-10-CM | POA: Insufficient documentation

## 2012-01-03 DIAGNOSIS — R443 Hallucinations, unspecified: Secondary | ICD-10-CM | POA: Insufficient documentation

## 2012-01-03 DIAGNOSIS — Q039 Congenital hydrocephalus, unspecified: Secondary | ICD-10-CM | POA: Insufficient documentation

## 2012-01-03 DIAGNOSIS — R51 Headache: Secondary | ICD-10-CM | POA: Insufficient documentation

## 2012-01-03 DIAGNOSIS — G4733 Obstructive sleep apnea (adult) (pediatric): Secondary | ICD-10-CM | POA: Insufficient documentation

## 2012-01-03 DIAGNOSIS — R61 Generalized hyperhidrosis: Secondary | ICD-10-CM | POA: Insufficient documentation

## 2012-01-03 DIAGNOSIS — G5600 Carpal tunnel syndrome, unspecified upper limb: Secondary | ICD-10-CM | POA: Insufficient documentation

## 2012-01-03 DIAGNOSIS — Z87442 Personal history of urinary calculi: Secondary | ICD-10-CM

## 2012-01-03 DIAGNOSIS — R32 Unspecified urinary incontinence: Secondary | ICD-10-CM | POA: Insufficient documentation

## 2012-01-03 DIAGNOSIS — R413 Other amnesia: Secondary | ICD-10-CM | POA: Insufficient documentation

## 2012-01-03 DIAGNOSIS — Z862 Personal history of diseases of the blood and blood-forming organs and certain disorders involving the immune mechanism: Secondary | ICD-10-CM | POA: Insufficient documentation

## 2012-01-03 DIAGNOSIS — F0280 Dementia in other diseases classified elsewhere without behavioral disturbance: Secondary | ICD-10-CM | POA: Insufficient documentation

## 2012-01-03 DIAGNOSIS — R269 Unspecified abnormalities of gait and mobility: Secondary | ICD-10-CM | POA: Insufficient documentation

## 2012-01-03 DIAGNOSIS — H532 Diplopia: Secondary | ICD-10-CM | POA: Insufficient documentation

## 2012-01-03 DIAGNOSIS — F329 Major depressive disorder, single episode, unspecified: Secondary | ICD-10-CM | POA: Insufficient documentation

## 2012-01-03 DIAGNOSIS — J3489 Other specified disorders of nose and nasal sinuses: Secondary | ICD-10-CM | POA: Insufficient documentation

## 2012-01-03 DIAGNOSIS — R569 Unspecified convulsions: Secondary | ICD-10-CM | POA: Insufficient documentation

## 2012-01-03 HISTORY — DX: Personal history of urinary calculi: Z87.442

## 2012-01-04 ENCOUNTER — Encounter: Payer: Self-pay | Admitting: Internal Medicine

## 2012-01-04 ENCOUNTER — Ambulatory Visit (INDEPENDENT_AMBULATORY_CARE_PROVIDER_SITE_OTHER): Payer: BC Managed Care – PPO | Admitting: *Deleted

## 2012-01-04 DIAGNOSIS — R55 Syncope and collapse: Secondary | ICD-10-CM

## 2012-01-04 LAB — PACEMAKER DEVICE OBSERVATION: DEVICE MODEL PM: 2354802

## 2012-01-04 NOTE — Progress Notes (Signed)
ILR interrogation 

## 2012-01-04 NOTE — Patient Instructions (Addendum)
Return office visit 04/03/12 @ 2:00pm with the device clinic.

## 2012-04-03 ENCOUNTER — Ambulatory Visit (INDEPENDENT_AMBULATORY_CARE_PROVIDER_SITE_OTHER): Payer: BC Managed Care – PPO | Admitting: *Deleted

## 2012-04-03 ENCOUNTER — Other Ambulatory Visit: Payer: Self-pay

## 2012-04-03 ENCOUNTER — Encounter: Payer: Self-pay | Admitting: Internal Medicine

## 2012-04-03 DIAGNOSIS — G9001 Carotid sinus syncope: Secondary | ICD-10-CM

## 2012-04-03 DIAGNOSIS — I4891 Unspecified atrial fibrillation: Secondary | ICD-10-CM

## 2012-04-03 NOTE — Progress Notes (Signed)
ILR interrogation 

## 2012-04-24 ENCOUNTER — Other Ambulatory Visit: Payer: Self-pay | Admitting: Neurosurgery

## 2012-04-24 DIAGNOSIS — R51 Headache: Secondary | ICD-10-CM

## 2012-04-29 ENCOUNTER — Ambulatory Visit
Admission: RE | Admit: 2012-04-29 | Discharge: 2012-04-29 | Disposition: A | Discharge status: Home / Self Care | Payer: BC Managed Care – PPO | Source: Ambulatory Visit | Attending: Neurosurgery | Admitting: Neurosurgery

## 2012-04-29 DIAGNOSIS — R51 Headache: Secondary | ICD-10-CM

## 2012-07-04 ENCOUNTER — Ambulatory Visit (INDEPENDENT_AMBULATORY_CARE_PROVIDER_SITE_OTHER): Payer: BC Managed Care – PPO | Admitting: *Deleted

## 2012-07-04 ENCOUNTER — Encounter: Payer: Self-pay | Admitting: Internal Medicine

## 2012-07-04 DIAGNOSIS — I4891 Unspecified atrial fibrillation: Secondary | ICD-10-CM

## 2012-07-04 DIAGNOSIS — R55 Syncope and collapse: Secondary | ICD-10-CM

## 2012-07-04 NOTE — Progress Notes (Signed)
ILR interrogation in office. 

## 2012-07-25 ENCOUNTER — Ambulatory Visit (INDEPENDENT_AMBULATORY_CARE_PROVIDER_SITE_OTHER): Payer: BC Managed Care – PPO | Admitting: Neurology

## 2012-07-25 ENCOUNTER — Encounter: Payer: Self-pay | Admitting: Neurology

## 2012-07-25 VITALS — BP 129/77 | HR 75 | Ht 76.0 in | Wt 318.0 lb

## 2012-07-25 DIAGNOSIS — R32 Unspecified urinary incontinence: Secondary | ICD-10-CM

## 2012-07-25 DIAGNOSIS — G479 Sleep disorder, unspecified: Secondary | ICD-10-CM

## 2012-07-25 DIAGNOSIS — Z862 Personal history of diseases of the blood and blood-forming organs and certain disorders involving the immune mechanism: Secondary | ICD-10-CM

## 2012-07-25 DIAGNOSIS — R269 Unspecified abnormalities of gait and mobility: Secondary | ICD-10-CM

## 2012-07-25 DIAGNOSIS — R569 Unspecified convulsions: Secondary | ICD-10-CM

## 2012-07-25 DIAGNOSIS — R51 Headache: Secondary | ICD-10-CM

## 2012-07-25 DIAGNOSIS — G4733 Obstructive sleep apnea (adult) (pediatric): Secondary | ICD-10-CM

## 2012-07-25 DIAGNOSIS — F329 Major depressive disorder, single episode, unspecified: Secondary | ICD-10-CM

## 2012-07-25 DIAGNOSIS — R443 Hallucinations, unspecified: Secondary | ICD-10-CM

## 2012-07-25 DIAGNOSIS — Q039 Congenital hydrocephalus, unspecified: Secondary | ICD-10-CM

## 2012-07-25 DIAGNOSIS — Z87442 Personal history of urinary calculi: Secondary | ICD-10-CM

## 2012-07-25 DIAGNOSIS — R413 Other amnesia: Secondary | ICD-10-CM

## 2012-07-25 DIAGNOSIS — J3489 Other specified disorders of nose and nasal sinuses: Secondary | ICD-10-CM

## 2012-07-25 DIAGNOSIS — H532 Diplopia: Secondary | ICD-10-CM

## 2012-07-25 DIAGNOSIS — R61 Generalized hyperhidrosis: Secondary | ICD-10-CM

## 2012-07-25 MED ORDER — LEVETIRACETAM 750 MG PO TABS: 750.0000 mg | ORAL_TABLET | Freq: Two times a day (BID) | ORAL | Status: DC

## 2012-07-25 NOTE — Progress Notes (Signed)
Reason for visit: Seizures  [name] is an 47 y.o. 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 The patient has required a VP shunt placement and subsequent revision. The patient is followed by Dr. Jeral Fruit for this. The patient also has a history of seizures with a seizure focus at the F4 area. The patient has been on Lamictal in the past, but he more recently was changed to Keppra when he had a seizure in January 2013. This represents his last seizure event. The patient was working for [employer], but he recently retired within the last month or so. The patient feels much better at this time, with a good energy level. The patient reports some ongoing issues with cognitive processing, and he believes that this is progressive over time. The patient has been evaluated for obstructive sleep apnea, and he was placed on CPAP several years ago, but he could not tolerate the mask. The patient indicates that at this time, his energy level is excellent. The patient does have a chronic gait disturbance, but he denies any recent falls. The patient denies problems controlling the bowels or the bladder. The patient is no longer having headaches. The patient returns to this office for an evaluation. He is tolerating the Keppra well.  Past Medical History  Diagnosis Date  . Hydrocephalus   . Seizure   . Chiari I malformation   . Atrial fibrillation   . Cardiomyopathy   . Pulmonary embolism   . SIADH (syndrome of inappropriate ADH production)   . Salt-wasting syndrome of infancy   . Coronary artery disease   . Seizures   . Depression   . Anxiety   . Carpal tunnel syndrome   . Headache(784.0)   . Syncope     Recurrent, thought to be neurally mediated  . Renal calculi   . Obstructive sleep apnea   . Restless leg syndrome   . Carpal tunnel syndrome   . Aqueductal stenosis     Past  Surgical History  Procedure Laterality Date  . Ventriculoperitoneal shunt    . Cholecystectomy    . [facility] jude dm [id number] loop      S/p loop recorder    Family History  Problem Relation Age of Onset  . Migraines Mother     Social history:  reports that he has never smoked. He does not have any smokeless tobacco history on file. He reports that he does not drink alcohol or use illicit drugs.  Allergies:  Allergies  Allergen Reactions  . Omnipaque (Iohexol)   . Prednisone Nausea And Vomiting and Other (See Comments)    unknown    Medications:  Current Outpatient Prescriptions on File Prior to Visit  Medication Sig Dispense Refill  . aspirin 81 MG tablet Take 81 mg by mouth daily.        . fludrocortisone (FLORINEF) 0.1 MG tablet Take 0.2 mg by mouth 2 (two) times daily.       Marland Kitchen lisinopril (PRINIVIL,ZESTRIL) 20 MG tablet Take 20 mg by mouth daily.      . meloxicam (MOBIC) 15 MG tablet Take 15 mg by mouth daily.      . pramipexole (MIRAPEX) 0.125 MG tablet Take 0.125 mg by mouth 2 (two) times daily.      . Sod Fluoride-Potassium Nitrate 1.1-5 % PSTE Place 1 application onto teeth 2 (two) times daily.      Marland Kitchen  sodium chloride 1 G tablet Take 3 g by mouth 3 (three) times daily.        No current facility-administered medications on file prior to visit.    ROS:  Out of a complete 14 system review of symptoms, the patient complains only of the following symptoms, and all other reviewed systems are negative.  Weight gain Blurred vision Feeling hot, increased thirst Memory loss, confusion, dizziness, tremor Depression, anxiety Hallucinations, racing thoughts Insomnia, snoring, restless legs  Blood pressure 129/77, pulse 75, height 6\' 4"  (1.93 m), weight 318 lb (144.244 kg).  Physical Exam  General: The patient is alert and cooperative at the time of the examination. The patient is moderately obese.  Skin: No significant peripheral edema is noted.   Neurologic Exam Mental  status: Mini-Mental status examination done today shows a total score of 25/30. The patient is able to name 23 animals in 60 seconds.  Cranial nerves: Facial symmetry is present. Speech is normal, no aphasia or dysarthria is noted. Extraocular movements are full. Visual fields are full.  Motor: The patient has good strength in all 4 extremities.  Coordination: The patient has good finger-nose-finger and heel-to-shin bilaterally.  Gait and station: The patient has a normal gait. Tandem gait is slightly unsteady. Romberg is negative. No drift is seen.  Reflexes: Deep tendon reflexes are symmetric.   Assessment/Plan:  1. Aqueductal stenosis, hydrocephalus  2. VP shunt placement  3. Obesity  4. Restless leg syndrome  5. History of seizures  6. Memory disorder  7. Atrial fibrillation  The patient is doing relatively well at this point. We will change the Keppra to taking 750 mg twice daily rather than 500 mg 3 times daily. The patient will be followed for the memory issues. If this appears to be progressive, the use of Namenda or Axona can be considered. The patient is interested in going on a diet, and I have recommended the Weight Watchers diet. The patient needs to remain active, and exercise on a regular basis. The patient will followup through this office in 6 or 7 months. The patient reports no family history of aqueductal stenosis  C. Lesia Sago MD 07/28/2012 7:38 PM  [facility] [address] [place], [place] [address]  Phone [phone] Fax [phone]

## 2012-08-29 ENCOUNTER — Telehealth: Payer: Self-pay | Admitting: Neurology

## 2012-08-30 DIAGNOSIS — Z0289 Encounter for other administrative examinations: Secondary | ICD-10-CM

## 2012-08-30 NOTE — Telephone Encounter (Signed)
I am returning patient's cal. We did receive forms from [facility] yesterday. And they came to me, the person who addresses Medical Leave forms, today. Due to the large number of forms we receive, Please give me up to two weeks to complete. I will contact you when they have been completed and signed.

## 2012-09-11 LAB — HM HEPATITIS C SCREENING LAB: HM Hepatitis Screen: NEGATIVE

## 2012-09-18 ENCOUNTER — Telehealth: Payer: Self-pay

## 2012-09-18 NOTE — Telephone Encounter (Signed)
I called and spoke with patient's wife. I reviewed patient need for FMLA and concurred that it should remain the same as it has been. I will have Dr. Anne Hahn sign form and have it faxed and mailed out in the next couple days.

## 2012-09-26 ENCOUNTER — Telehealth: Payer: Self-pay | Admitting: Neurology

## 2012-09-26 NOTE — Telephone Encounter (Signed)
[name] states question number 5 on form was incorrect. Says in order to get FMLA form approved question number 5 has to state the patient has an appt in our office once a week. Advised would fwd info to RN who handles forms.

## 2012-09-27 NOTE — Telephone Encounter (Signed)
I called patient's wife and left VM that we are unable to change FMLA form to show that he is going to doctor appointments weekly when he is not. That is fraud and we would be in big trouble if we did that. The form was completed based on what information we have and what his needs have been and are expected to be. FMLA forms are for patients and their family member to care for the patient to bring them to doctor appointments and to care for patient with flare ups. We have completed the form appropriately to meet the patient's needs. Please feel free to call back with any questions.

## 2012-09-30 ENCOUNTER — Telehealth: Payer: Self-pay | Admitting: Neurology

## 2012-10-03 ENCOUNTER — Encounter: Payer: Self-pay | Admitting: Internal Medicine

## 2012-10-03 ENCOUNTER — Ambulatory Visit (INDEPENDENT_AMBULATORY_CARE_PROVIDER_SITE_OTHER): Payer: BC Managed Care – PPO | Admitting: Internal Medicine

## 2012-10-03 ENCOUNTER — Encounter: Payer: Self-pay | Admitting: *Deleted

## 2012-10-03 VITALS — BP 120/91 | HR 77 | Ht 77.0 in | Wt 331.6 lb

## 2012-10-03 DIAGNOSIS — Z959 Presence of cardiac and vascular implant and graft, unspecified: Secondary | ICD-10-CM

## 2012-10-03 DIAGNOSIS — I4891 Unspecified atrial fibrillation: Secondary | ICD-10-CM

## 2012-10-03 DIAGNOSIS — R55 Syncope and collapse: Secondary | ICD-10-CM

## 2012-10-03 NOTE — Progress Notes (Signed)
Patient Care Team: Gus Height as PCP - General (Family Medicine)   HPI  [name] is a 47 y.o. male Seen in followup for syncope thought to be neurally mediated. His history of her previously implanted loop recorder. He also has a history of a salt wasting syndrome  He also has a history of atrial fibrillation  He has had no intercurrent syncope. Overall he is feeling quite well. He has had some flushing issues associated with diaphoresis. He is followed by endocrinology in [facility].  He has been let go from [employer]  Past Medical History  Diagnosis Date  . Hydrocephalus   . Seizure   . Chiari I malformation   . Atrial fibrillation   . Cardiomyopathy   . Pulmonary embolism   . SIADH (syndrome of inappropriate ADH production)   . Salt-wasting syndrome of infancy   . Coronary artery disease   . Seizures   . Depression   . Anxiety   . Carpal tunnel syndrome   . Headache(784.0)   . Syncope     Recurrent, thought to be neurally mediated  . Renal calculi   . Obstructive sleep apnea   . Restless leg syndrome   . Carpal tunnel syndrome   . Aqueductal stenosis     Past Surgical History  Procedure Laterality Date  . Ventriculoperitoneal shunt    . Cholecystectomy    . [facility] jude dm [id number] loop      S/p loop recorder    Current Outpatient Prescriptions  Medication Sig Dispense Refill  . aspirin 81 MG tablet Take 81 mg by mouth daily.        . fludrocortisone (FLORINEF) 0.1 MG tablet Take 0.2 mg by mouth 2 (two) times daily.       . hydrocortisone (CORTEF) 10 MG tablet Take 10 mg by mouth daily.      Marland Kitchen levETIRAcetam (KEPPRA) 750 MG tablet Take 1 tablet (750 mg total) by mouth 2 (two) times daily.  60 tablet  11  . lisinopril (PRINIVIL,ZESTRIL) 20 MG tablet Take 20 mg by mouth daily.      . Melatonin 1 MG CAPS Take 1 capsule by mouth daily.      . meloxicam (MOBIC) 15 MG tablet Take 15 mg by mouth daily.      . pramipexole (MIRAPEX) 0.125 MG tablet Take 0.125  mg by mouth 2 (two) times daily.      . Sod Fluoride-Potassium Nitrate 1.1-5 % PSTE Place 1 application onto teeth 2 (two) times daily.      . sodium chloride 1 G tablet Take 3 g by mouth 3 (three) times daily.       Marland Kitchen venlafaxine XR (EFFEXOR-XR) 75 MG 24 hr capsule Take 75 mg by mouth 3 (three) times daily.       No current facility-administered medications for this visit.    Allergies  Allergen Reactions  . Omnipaque [Iohexol]   . Prednisone Nausea And Vomiting and Other (See Comments)    unknown    Review of Systems negative except from HPI and PMH  Physical Exam BP 120/91  Pulse 77  Ht 6\' 5"  (1.956 m)  Wt 331 lb 9.6 oz (150.413 kg)  BMI 39.31 kg/m2 Well developed and well nourished in no acute distress HENT normal E scleral and icterus clear Neck Supple JVP flat; carotids brisk and full Clear to ausculation Regular rate and rhythm, no murmurs gallops or rub Soft with active bowel sounds No clubbing cyanosis none Edema  Alert and oriented, grossly normal motor and sensory function Skin Warm and Dry  Implantable loop recorder demonstrates no significant arrhythmias. There are multiple artifactual storages  Assessment and  Plan

## 2012-10-03 NOTE — Patient Instructions (Addendum)
Your physician has recommended that you have your loop recorder taken out on 10/30/12    Your physician recommends that you continue on your current medications as directed. Please refer to the Current Medication list given to you today.

## 2012-10-03 NOTE — Assessment & Plan Note (Signed)
Nearly ERI   Will remove

## 2012-10-03 NOTE — Assessment & Plan Note (Signed)
No recurrent  

## 2012-10-03 NOTE — Assessment & Plan Note (Signed)
No clinical sfib

## 2012-10-04 LAB — PACEMAKER DEVICE OBSERVATION

## 2012-10-04 NOTE — Telephone Encounter (Signed)
Forms faxed to patient's wife this am.

## 2012-10-18 ENCOUNTER — Encounter (HOSPITAL_COMMUNITY): Payer: Self-pay | Admitting: Pharmacy Technician

## 2012-10-23 ENCOUNTER — Other Ambulatory Visit: Payer: BC Managed Care – PPO

## 2012-10-30 ENCOUNTER — Ambulatory Visit (HOSPITAL_COMMUNITY)
Admission: RE | Admit: 2012-10-30 | Discharge: 2012-10-30 | Disposition: A | Discharge status: Home / Self Care | Payer: BC Managed Care – PPO | Source: Ambulatory Visit | Attending: Internal Medicine | Admitting: Internal Medicine

## 2012-10-30 ENCOUNTER — Encounter (HOSPITAL_COMMUNITY)
Admission: RE | Disposition: A | Discharge status: Home / Self Care | Payer: Self-pay | Source: Ambulatory Visit | Attending: Internal Medicine

## 2012-10-30 DIAGNOSIS — I4891 Unspecified atrial fibrillation: Secondary | ICD-10-CM | POA: Insufficient documentation

## 2012-10-30 DIAGNOSIS — R55 Syncope and collapse: Secondary | ICD-10-CM | POA: Insufficient documentation

## 2012-10-30 DIAGNOSIS — I2510 Atherosclerotic heart disease of native coronary artery without angina pectoris: Secondary | ICD-10-CM | POA: Insufficient documentation

## 2012-10-30 DIAGNOSIS — I428 Other cardiomyopathies: Secondary | ICD-10-CM | POA: Insufficient documentation

## 2012-10-30 DIAGNOSIS — Z982 Presence of cerebrospinal fluid drainage device: Secondary | ICD-10-CM | POA: Insufficient documentation

## 2012-10-30 HISTORY — PX: LOOP RECORDER EXPLANT: SHX5476

## 2012-10-30 SURGERY — LOOP RECORDER EXPLANT
Anesthesia: LOCAL

## 2012-10-30 MED ORDER — CHLORHEXIDINE GLUCONATE 4 % EX LIQD
60.0000 mL | Freq: Once | CUTANEOUS | Status: DC
  Filled 2012-10-30: qty 60

## 2012-10-30 MED ORDER — DEXTROSE 5 % IV SOLN
3.0000 g | INTRAVENOUS | Status: DC
  Filled 2012-10-30: qty 3000

## 2012-10-30 MED ORDER — MIDAZOLAM HCL 5 MG/5ML IJ SOLN
INTRAMUSCULAR | Status: AC
  Filled 2012-10-30: qty 5

## 2012-10-30 MED ORDER — LIDOCAINE HCL (PF) 1 % IJ SOLN
INTRAMUSCULAR | Status: AC
  Filled 2012-10-30: qty 60

## 2012-10-30 MED ORDER — FENTANYL CITRATE 0.05 MG/ML IJ SOLN
INTRAMUSCULAR | Status: AC
  Filled 2012-10-30: qty 2

## 2012-10-30 MED ORDER — SODIUM CHLORIDE 0.9 % IV SOLN: INTRAVENOUS | Status: DC

## 2012-10-30 MED ORDER — SODIUM CHLORIDE 0.9 % IR SOLN
80.0000 mg | Status: DC
  Filled 2012-10-30: qty 2

## 2012-10-30 NOTE — H&P (View-Only) (Signed)
Patient Care Team: Gus Height as PCP - General (Family Medicine)   HPI  [name] is a 47 y.o. male Seen in followup for syncope thought to be neurally mediated. His history of her previously implanted loop recorder. He also has a history of a salt wasting syndrome  He also has a history of atrial fibrillation  He has had no intercurrent syncope. Overall he is feeling quite well. He has had some flushing issues associated with diaphoresis. He is followed by endocrinology in [facility].  He has been let go from [employer]  Past Medical History  Diagnosis Date  . Hydrocephalus   . Seizure   . Chiari I malformation   . Atrial fibrillation   . Cardiomyopathy   . Pulmonary embolism   . SIADH (syndrome of inappropriate ADH production)   . Salt-wasting syndrome of infancy   . Coronary artery disease   . Seizures   . Depression   . Anxiety   . Carpal tunnel syndrome   . Headache(784.0)   . Syncope     Recurrent, thought to be neurally mediated  . Renal calculi   . Obstructive sleep apnea   . Restless leg syndrome   . Carpal tunnel syndrome   . Aqueductal stenosis     Past Surgical History  Procedure Laterality Date  . Ventriculoperitoneal shunt    . Cholecystectomy    . St. jude dm [id number] loop      S/p loop recorder    Current Outpatient Prescriptions  Medication Sig Dispense Refill  . aspirin 81 MG tablet Take 81 mg by mouth daily.        . fludrocortisone (FLORINEF) 0.1 MG tablet Take 0.2 mg by mouth 2 (two) times daily.       . hydrocortisone (CORTEF) 10 MG tablet Take 10 mg by mouth daily.      Marland Kitchen levETIRAcetam (KEPPRA) 750 MG tablet Take 1 tablet (750 mg total) by mouth 2 (two) times daily.  60 tablet  11  . lisinopril (PRINIVIL,ZESTRIL) 20 MG tablet Take 20 mg by mouth daily.      . Melatonin 1 MG CAPS Take 1 capsule by mouth daily.      . meloxicam (MOBIC) 15 MG tablet Take 15 mg by mouth daily.      . pramipexole (MIRAPEX) 0.125 MG tablet Take 0.125  mg by mouth 2 (two) times daily.      . Sod Fluoride-Potassium Nitrate 1.1-5 % PSTE Place 1 application onto teeth 2 (two) times daily.      . sodium chloride 1 G tablet Take 3 g by mouth 3 (three) times daily.       Marland Kitchen venlafaxine XR (EFFEXOR-XR) 75 MG 24 hr capsule Take 75 mg by mouth 3 (three) times daily.       No current facility-administered medications for this visit.    Allergies  Allergen Reactions  . Omnipaque [Iohexol]   . Prednisone Nausea And Vomiting and Other (See Comments)    unknown    Review of Systems negative except from HPI and PMH  Physical Exam BP 120/91  Pulse 77  Ht 6\' 5"  (1.956 m)  Wt 331 lb 9.6 oz (150.413 kg)  BMI 39.31 kg/m2 Well developed and well nourished in no acute distress HENT normal E scleral and icterus clear Neck Supple JVP flat; carotids brisk and full Clear to ausculation Regular rate and rhythm, no murmurs gallops or rub Soft with active bowel sounds No clubbing cyanosis none Edema  Alert and oriented, grossly normal motor and sensory function Skin Warm and Dry  Implantable loop recorder demonstrates no significant arrhythmias. There are multiple artifactual storages  Assessment and  Plan

## 2012-10-30 NOTE — CV Procedure (Signed)
Loop recorder explant under local anesthesia without complications

## 2012-10-30 NOTE — Interval H&P Note (Signed)
History and Physical Interval Note:  10/30/2012 8:15 AM  [name]  has presented today for surgery, with the diagnosis of a-fib  The various methods of treatment have been discussed with the patient and family. After consideration of risks, benefits and other options for treatment, the patient has consented to  Procedure(s): LOOP RECORDER EXPLANT (N/A) as a surgical intervention .  The patient's history has been reviewed, patient examined, no change in status, stable for surgery.  I have reviewed the patient's chart and labs.  Questions were answered to the patient's satisfaction.     Sherryl Manges

## 2012-12-09 ENCOUNTER — Other Ambulatory Visit: Payer: Self-pay

## 2012-12-09 MED ORDER — VENLAFAXINE HCL ER 75 MG PO CP24: 75.0000 mg | ORAL_CAPSULE | Freq: Three times a day (TID) | ORAL | Status: DC

## 2012-12-09 NOTE — Telephone Encounter (Signed)
Former [facility] patient.  We have been prescribing Effexor since 2013.

## 2012-12-17 DIAGNOSIS — Z0289 Encounter for other administrative examinations: Secondary | ICD-10-CM

## 2013-01-12 ENCOUNTER — Other Ambulatory Visit: Payer: Self-pay

## 2013-01-12 MED ORDER — PRAMIPEXOLE DIHYDROCHLORIDE 0.125 MG PO TABS: 0.1250 mg | ORAL_TABLET | Freq: Two times a day (BID) | ORAL | Status: DC

## 2013-01-13 ENCOUNTER — Telehealth: Payer: Self-pay | Admitting: Neurology

## 2013-01-13 NOTE — Telephone Encounter (Signed)
Patient called and is very upset that he has not heard back from anyone about getting his medical information to his attorney. Please call him back.

## 2013-01-30 ENCOUNTER — Ambulatory Visit: Payer: BC Managed Care – PPO | Admitting: Nurse Practitioner

## 2013-02-20 ENCOUNTER — Encounter: Payer: Self-pay | Admitting: Nurse Practitioner

## 2013-02-20 ENCOUNTER — Ambulatory Visit (INDEPENDENT_AMBULATORY_CARE_PROVIDER_SITE_OTHER): Payer: BC Managed Care – PPO | Admitting: Nurse Practitioner

## 2013-02-20 ENCOUNTER — Encounter (INDEPENDENT_AMBULATORY_CARE_PROVIDER_SITE_OTHER): Payer: Self-pay

## 2013-02-20 VITALS — BP 118/78 | HR 75 | Ht 76.5 in | Wt 342.0 lb

## 2013-02-20 DIAGNOSIS — R413 Other amnesia: Secondary | ICD-10-CM

## 2013-02-20 DIAGNOSIS — R269 Unspecified abnormalities of gait and mobility: Secondary | ICD-10-CM

## 2013-02-20 DIAGNOSIS — R569 Unspecified convulsions: Secondary | ICD-10-CM

## 2013-02-20 NOTE — Progress Notes (Signed)
[facility]  PATIENT: [name] DOB: September 03, 1965   REASON FOR VISIT: Followup for seizure disorder   HISTORY OF PRESENT ILLNESS: [name], 48 year old male returns for followup. He has a history of seizure disorder is currently on Keppra with last seizure occurring in January 2013. He also has a VP shunt and is followed by Dr. Joya [name] for this. He has a chronic gait disorder but denies any recent falls. He denies any headaches, problems controlling her bowels or bladder. He reports ongoing issues with cognitive function. He also has a history of obstructive sleep apnea however he was not able to tolerate the mask. He returns for reevaluation.   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 The patient has required a VP shunt placement and subsequent revision. The patient is followed by Dr. Joya [name] for this. The patient also has a history of seizures with a seizure focus at the F4 area. The patient has been on Lamictal in the past, but he more recently was changed to [place] when he had a seizure in January 2013. This represents his last seizure event. The patient was working for [employer], but he recently retired within the last month or so. The patient feels much better at this time, with a good energy level. The patient reports some ongoing issues with cognitive processing, and he believes that this is progressive over time. The patient has been evaluated for obstructive sleep apnea, and he was placed on CPAP several years ago, but he could not tolerate the mask. The patient indicates that at this time, his energy level is excellent. The patient does have a chronic gait disturbance, but he denies any recent falls. The patient denies problems controlling the bowels or the bladder. The patient is no longer having headaches. The patient returns to this office for an evaluation. He is tolerating the Keppra well.   REVIEW OF SYSTEMS: Full 14 system review of  systems performed and notable only for those listed, all others are neg:  Constitutional: Fatigue  Cardiovascular: N/A  Ear/Nose/Throat: Ringing in the ears  Eyes: N/A  Respiratory: N/A  Gastroitestinal: N/A  Hematology/Lymphatic: N/A  Endocrine: Flushing, excessive thirst Musculoskeletal:N/A  Allergy/Immunology: N/A  Neurological: Memory loss  Psychiatric: Depression anxiety Sleep snoring   ALLERGIES: Allergies  Allergen Reactions  . Omnipaque [Iohexol] Hives  . Prednisone Nausea And Vomiting and Other (See Comments)    unknown    HOME MEDICATIONS: Outpatient Prescriptions Prior to Visit  Medication Sig Dispense Refill  . aspirin 81 MG tablet Take 81 mg by mouth daily.        . fludrocortisone (FLORINEF) 0.1 MG tablet Take 0.2 mg by mouth 2 (two) times daily.       . hydrocortisone (CORTEF) 10 MG tablet Take 10 mg by mouth daily.      Marland Kitchen levETIRAcetam (KEPPRA) 750 MG tablet Take 1 tablet (750 mg total) by mouth 2 (two) times daily.  60 tablet  11  . lisinopril (PRINIVIL,ZESTRIL) 20 MG tablet Take 20 mg by mouth daily.      . Melatonin 1 MG CAPS Take 1 capsule by mouth daily.      . meloxicam (MOBIC) 15 MG tablet Take 15 mg by mouth daily.      . pramipexole (MIRAPEX) 0.125 MG tablet Take 1 tablet (0.125 mg total) by mouth 2 (two) times daily.  60 tablet  1  . Sod Fluoride-Potassium Nitrate 1.1-5 % PSTE Place 1 application onto teeth 2 (two) times daily.      Marland Kitchen  venlafaxine XR (EFFEXOR-XR) 75 MG 24 hr capsule Take 1 capsule (75 mg total) by mouth 3 (three) times daily.  90 capsule  6   No facility-administered medications prior to visit.    PAST MEDICAL HISTORY: Past Medical History  Diagnosis Date  . Hydrocephalus   . Seizure   . Chiari I malformation   . Atrial fibrillation   . Cardiomyopathy   . Pulmonary embolism   . SIADH (syndrome of inappropriate ADH production)   . Salt-wasting syndrome of infancy   . Coronary artery disease   . Seizures   . Depression   .  Anxiety   . Carpal tunnel syndrome   . Headache(784.0)   . Syncope     Recurrent, thought to be neurally mediated  . Renal calculi   . Obstructive sleep apnea   . Restless leg syndrome   . Carpal tunnel syndrome   . Aqueductal stenosis     PAST SURGICAL HISTORY: Past Surgical History  Procedure Laterality Date  . Ventriculoperitoneal shunt    . Cholecystectomy    . St. jude dm [id number] loop      S/p loop recorder    FAMILY HISTORY: Family History  Problem Relation Age of Onset  . Migraines Mother     SOCIAL HISTORY: History   Social History  . Marital Status: Married    Spouse Name: [name]    Number of Children: 0  . Years of Education: 12   Occupational History  . CUSTOMER SERVICE [employer]   Social History Main Topics  . Smoking status: Never Smoker   . Smokeless tobacco: Not on file  . Alcohol Use: No  . Drug Use: No  . Sexual Activity: Not on file   Other Topics Concern  . Not on file   Social History Narrative   Patient is married [name]), no children   Patient is right handed   Education level is high school graduate   Caffeine consumption is 1/2 of 2L daily     PHYSICAL EXAM  Filed Vitals:   02/20/13 0942  BP: 118/78  Pulse: 75  Height: 6' 4.5" (1.943 m)  Weight: 342 lb (155.13 kg)   Body mass index is 41.09 kg/(m^2).  Generalized: Well developed, obese male in no acute distress  Head: normocephalic and atraumatic,. Oropharynx benign  Neck: Supple, no carotid bruits  Cardiac: Regular rate rhythm, no murmur  Musculoskeletal: No deformity   Neurological examination   Mentation: Alert oriented to time, place, history taking. MMSE 29/30, AFT 24.Follows all commands speech and language fluent  Cranial nerve II-XII: Pupils were equal round reactive to light,  extraocular movements were full, visual field were full on confrontational test. Facial sensation and strength were normal. hearing was intact to finger rubbing bilaterally. Uvula  tongue midline. head turning and shoulder shrug were normal and symmetric.Tongue protrusion into cheek strength was normal. Motor: normal bulk and tone, full strength in the BUE, BLE, fine finger movements normal, no pronator drift. No focal weakness Coordination: finger-nose-finger, heel-to-shin bilaterally, no dysmetria Reflexes: Brachioradialis 2/2, biceps 2/2, triceps 2/2, patellar 2/2, Achilles 2/2, plantar responses were flexor bilaterally. Gait and Station: Rising up from seated position without assistance, normal stance,  moderate stride, good arm swing, smooth turning, able to perform tiptoe, and heel walking without difficulty. Tandem gait is mildly unsteady  DIAGNOSTIC DATA (LABS, IMAGING, TESTING) None to review  ASSESSMENT AND PLAN  48 y.o. year old male  has a past medical history of Hydrocephalus; Seizure; Chiari  I malformation; Atrial fibrillation; Cardiomyopathy; Pulmonary embolism; SIADH (syndrome of inappropriate ADH production); Salt-wasting syndrome of infancy; Coronary artery disease;  Depression; Anxiety;  Obstructive sleep apnea; Restless leg syndrome; Carpal tunnel syndrome; and Aqueductal stenosis. here to followup. He also has mild cognitive issues and his Mini-Mental score is 29 of 30.  Continue Keppra at current dose Continue Effexor at current dose. Does not need refills on meds Will follow up in 6 to 8 months Dennie Bible, [facility], [facility], [facility] [address], [place], [place] [address] [phone]

## 2013-02-20 NOTE — Patient Instructions (Signed)
Continue Keppra at current dose Will follow up in 6 to 8 months

## 2013-02-20 NOTE — Progress Notes (Signed)
I have read the note, and I agree with the clinical assessment and plan.  WILLIS,CHARLES KEITH   

## 2013-02-21 DIAGNOSIS — Z0289 Encounter for other administrative examinations: Secondary | ICD-10-CM

## 2013-03-27 ENCOUNTER — Other Ambulatory Visit: Payer: Self-pay | Admitting: Neurology

## 2013-04-09 ENCOUNTER — Telehealth: Payer: Self-pay | Admitting: Neurology

## 2013-04-09 NOTE — Telephone Encounter (Signed)
Pt's wife called and asked to speak with Dr. [name].  She stated that Mr. Kilmer was let go at work in May 2014. He applied for disability but was denied.  They have hired a Chief Executive Officer and he told them that they would need Dr. Tobey Grim support for him to get disability.  Please call to advise.  Thank you.

## 2013-04-09 NOTE — Telephone Encounter (Signed)
I called the patient. We will be happy to provide any documentation needed for the disability process. SSI disability claims usually do not ask for our opinions concerning disability.

## 2013-04-09 NOTE — Telephone Encounter (Signed)
Pt's wife called stating that the pt was let go at work in May 2014 and applied for disability and was denied. Pt's wife [name] stated that they have hired a Chief Executive Officer and wanted to know if Dr. [name] would support the pt, if need be to get disability. Pt's wife stated that she did not need a letter written, just a yes or no answer. Please advise

## 2013-08-12 ENCOUNTER — Other Ambulatory Visit: Payer: Self-pay | Admitting: Neurology

## 2013-08-14 ENCOUNTER — Telehealth: Payer: Self-pay | Admitting: Neurology

## 2013-08-14 NOTE — Telephone Encounter (Signed)
Called pt and spoke with pt's wife [name] to set up an appt with Dr. [name] on 08/19/13. I advised the wife that if the pt has any other problems, questions or concerns to call the office. Wife verbalized understanding.

## 2013-08-14 NOTE — Telephone Encounter (Signed)
Patient's wife calling to state patient needs a sooner appointment because he cannot remember his last name. Please return call to wife and advise.

## 2013-08-19 ENCOUNTER — Telehealth: Payer: Self-pay | Admitting: Neurology

## 2013-08-19 ENCOUNTER — Other Ambulatory Visit (INDEPENDENT_AMBULATORY_CARE_PROVIDER_SITE_OTHER): Payer: Self-pay | Admitting: Radiology

## 2013-08-19 ENCOUNTER — Ambulatory Visit (INDEPENDENT_AMBULATORY_CARE_PROVIDER_SITE_OTHER): Payer: BC Managed Care – PPO | Admitting: Neurology

## 2013-08-19 ENCOUNTER — Encounter: Payer: Self-pay | Admitting: Neurology

## 2013-08-19 VITALS — BP 138/89 | HR 82 | Wt 341.0 lb

## 2013-08-19 DIAGNOSIS — R269 Unspecified abnormalities of gait and mobility: Secondary | ICD-10-CM

## 2013-08-19 DIAGNOSIS — G40309 Generalized idiopathic epilepsy and epileptic syndromes, not intractable, without status epilepticus: Secondary | ICD-10-CM

## 2013-08-19 DIAGNOSIS — R51 Headache: Secondary | ICD-10-CM

## 2013-08-19 DIAGNOSIS — R32 Unspecified urinary incontinence: Secondary | ICD-10-CM

## 2013-08-19 DIAGNOSIS — Q039 Congenital hydrocephalus, unspecified: Secondary | ICD-10-CM

## 2013-08-19 DIAGNOSIS — R413 Other amnesia: Secondary | ICD-10-CM

## 2013-08-19 DIAGNOSIS — Z0289 Encounter for other administrative examinations: Secondary | ICD-10-CM

## 2013-08-19 DIAGNOSIS — R569 Unspecified convulsions: Secondary | ICD-10-CM

## 2013-08-19 MED ORDER — LEVETIRACETAM 1000 MG PO TABS
1000.0000 mg | ORAL_TABLET | Freq: Two times a day (BID) | ORAL | Status: DC
Start: 2013-08-19 — End: 2014-07-24

## 2013-08-19 NOTE — Procedures (Signed)
      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 He has a history of a seizure disorder, and he presents with frequent episodes of transient confusion. The patient is being evaluated for possible ongoing seizure-type events.  This is a routine EEG. No skull defects are noted. Medications include aspirin, Florinef, hydrocortisone, Keppra, lisinopril, Mirapex, and Effexor.  EEG classification: Dysrhythmia grade 2 bihemispheric, left greater than right  Description of the recording: The background rhythms of this recording consists of a fairly well modulated medium amplitude alpha rhythm of 8 Hz that is reactive to opening and closure. As the record progresses, photic stimulation is performed, and results in a minimal but bilateral photic driving response. Hyperventilation was not performed. As the record continues, intermittent episodes of dysrhythmic theta slowing are seen emanating from both hemispheres, but often times is more prominent on the left than the right, occasionally associated with sharp transients. As the record continues, the patient appears to enter the drowsy state with bilateral background slowing seen. The patient never clearly enters stage II sleep. At no time during the recording does there appear to be evidence of actual spike or spike wave discharges. EKG monitor shows no evidence of cardiac rhythm abnormalities with a heart rate of 72.  Impression: This is an abnormal EEG recording secondary to dysrhythmic theta activity emanating from the left greater than right hemisphere. This study suggests bihemispheric abnormalities, with a lowered seizure threshold, possibly from a deep midline nuclei source. No electrographic seizures were recorded.

## 2013-08-19 NOTE — Patient Instructions (Signed)

## 2013-08-19 NOTE — Progress Notes (Addendum)
Reason for visit: Seizures  [name] is an 48 y.o. male  History of present illness:  [name] is a 49 year old right-handed white male with a history of aqueduct stenosis and hydrocephalus requiring VP shunt placement. The first shunt was placed in 2009. The patient has a history of seizure-type events that predated the VP shunt placement. He will have events of shaking on the arms and head that may last several minutes. The last such episode was 18 months ago. The patient is on Keppra taking 750 mg twice daily, and he is tolerating the medication well. The patient indicates that within the last 6 weeks, he has had an increased frequency of episodes of confusion lasting 2-4 minutes. The wife indicates that he will do unusual things during the episodes, such as placing milk on top of the refrigerator instead in it. The patient had an event recently where he could not remember his name at the dentist's office. He indicates that he is now having increased frequency of headaches occurring up to 3 times a day, and he takes oxycodone and ibuprofen for this. He has noted some change in balance, and he has not had any falls, but he will occasionally stumble. He has had urgency over the last several months with the bowels and the bladder that is new for him. The patient denies any nausea or vomiting. He returns to this office for an evaluation. The last CT scan of brain was done in April of 2014, and it showed some improvement in ventricular size from a prior study.  Past Medical History  Diagnosis Date  . Hydrocephalus   . Seizure   . Chiari I malformation   . Atrial fibrillation   . Cardiomyopathy   . Pulmonary embolism   . SIADH (syndrome of inappropriate ADH production)   . Salt-wasting syndrome of infancy   . Coronary artery disease   . Seizures   . Depression   . Anxiety   . Carpal tunnel syndrome   . Headache(784.0)   . Syncope     Recurrent, thought to be neurally mediated    . Renal calculi   . Obstructive sleep apnea   . Restless leg syndrome   . Carpal tunnel syndrome   . Aqueductal stenosis     Past Surgical History  Procedure Laterality Date  . Ventriculoperitoneal shunt    . Cholecystectomy    . [facility] dm [id number] loop      S/p loop recorder    Family History  Problem Relation Age of Onset  . Migraines Mother   . Seizures Neg Hx     Social history:  reports that he has never smoked. He does not have any smokeless tobacco history on file. He reports that he does not drink alcohol or use illicit drugs.    Allergies  Allergen Reactions  . Omnipaque [Iohexol] Hives  . Prednisone Nausea And Vomiting and Other (See Comments)    unknown    Medications:  Current Outpatient Prescriptions on File Prior to Visit  Medication Sig Dispense Refill  . aspirin 81 MG tablet Take 81 mg by mouth daily.        . fludrocortisone (FLORINEF) 0.1 MG tablet Take 0.2 mg by mouth 2 (two) times daily.       . hydrocortisone (CORTEF) 10 MG tablet Take 10 mg by mouth daily.      Marland Kitchen lisinopril (PRINIVIL,ZESTRIL) 20 MG tablet Take 20 mg by mouth daily.      Marland Kitchen  Melatonin 1 MG CAPS Take 1 capsule by mouth daily.      . pramipexole (MIRAPEX) 0.125 MG tablet TAKE ONE TABLET BY MOUTH TWICE DAILY  60 tablet  11  . Sod Fluoride-Potassium Nitrate 1.1-5 % PSTE Place 1 application onto teeth 2 (two) times daily.      Marland Kitchen venlafaxine XR (EFFEXOR-XR) 75 MG 24 hr capsule Take 1 capsule (75 mg total) by mouth 3 (three) times daily.  90 capsule  6   No current facility-administered medications on file prior to visit.    ROS:  Out of a complete 14 system review of symptoms, the patient complains only of the following symptoms, and all other reviewed systems are negative.  Activity change, appetite change, fatigue, weight gain Cough, wheezing Light sensitivity Palpitations of the heart Heat intolerance, excessive thirst, excessive eating, flushing Diarrhea, nausea Restless  legs, insomnia, frequent waking, daytime sleepiness, snoring, sleep talking Incontinence of bladder, frequency of urination, urinary urgency Memory loss, headache, seizures, speech difficulty, tremors Agitation, behavior problems, confusion, decreased concentration, depression, anxiety, hallucinations  Blood pressure 138/89, pulse 82, weight 341 lb (154.677 kg).  Physical Exam  General: The patient is alert and cooperative at the time of the examination. The patient is moderately obese.  Skin: No significant peripheral edema is noted.   Neurologic Exam  Mental status: The Mini-Mental status examination done today shows a total score of 22/30.  Cranial nerves: Facial symmetry is present. Speech is normal, no aphasia or dysarthria is noted. Extraocular movements are full. Visual fields are full. Pupils are equal, round, and reactive to light. Discs are flat bilaterally.  Motor: The patient has good strength in all 4 extremities.  Sensory examination: Soft touch sensation is symmetric on the face, arms, and legs.  Coordination: The patient has good finger-nose-finger and heel-to-shin bilaterally.  Gait and station: The patient has a normal gait. Tandem gait is unsteady. Romberg is negative. No drift is seen.  Reflexes: Deep tendon reflexes are symmetric.   CT head 05/04/12:  IMPRESSION:  Stable bilateral frontal approach ventriculostomy catheters.  Ventriculomegaly is mildly improved.     Assessment/Plan:  1. History of aqueduct stenosis, VP shunt placement  2. Gait disorder  3. History seizures  4. Memory disorder  5. Urgency of bowel and bladder  The patient appears to have had some change in functional level over the last several months, particularly within the last 6 weeks. The patient is having brief episodes of confusion that may represent seizures. The patient is on Keppra taking 750 mg twice daily, and this dose will be increased to 1000 mg twice daily. An EEG  study will be done today. The patient will be set up for a repeat CT scan of the brain to compare to the last study. He is followed by Dr. Joya Salm for his VP shunt. He may require a neurosurgical referral if there appears to be a change in his VP shunt function. Otherwise, he will followup in 4 months. The patient is not operating a motor vehicle.  Jill Alexanders MD 08/19/2013 2:27 PM  [facility] [address] [place], [place] [address]  Phone [phone] Fax [phone]

## 2013-08-19 NOTE — Telephone Encounter (Signed)
Called patient. The EEG study did show some mild dysrhythmic theta activity emanating from both hemispheres of the brain, left greater than right. No electrographic seizures. The patient has gone up on the Keppra dose, we will check a CT scan of the brain.

## 2013-08-20 DIAGNOSIS — R269 Unspecified abnormalities of gait and mobility: Secondary | ICD-10-CM

## 2013-08-22 ENCOUNTER — Telehealth: Payer: Self-pay | Admitting: Neurology

## 2013-08-22 ENCOUNTER — Other Ambulatory Visit: Payer: Self-pay | Admitting: Diagnostic Neuroimaging

## 2013-08-22 ENCOUNTER — Telehealth: Payer: Self-pay | Admitting: *Deleted

## 2013-08-22 DIAGNOSIS — R269 Unspecified abnormalities of gait and mobility: Secondary | ICD-10-CM

## 2013-08-22 DIAGNOSIS — R413 Other amnesia: Secondary | ICD-10-CM

## 2013-08-22 DIAGNOSIS — R32 Unspecified urinary incontinence: Secondary | ICD-10-CM

## 2013-08-22 DIAGNOSIS — Q039 Congenital hydrocephalus, unspecified: Secondary | ICD-10-CM

## 2013-08-22 DIAGNOSIS — R51 Headache: Secondary | ICD-10-CM

## 2013-08-22 NOTE — Telephone Encounter (Signed)
Wife calling , pt took 1750mg  po this am of keppra around 0900-1000. Dose was increased to 1000mg  po bid on the 08-19-13 from your visit.  Flushed, but no other s/s.  [id number].

## 2013-08-22 NOTE — Telephone Encounter (Signed)
  I called patient, the CT the head shows no change from April 2014. Clearly this study is much better than the prior scan done in 2013 with a ventricular space was much larger. The patient is getting on a higher dose of Keppra, they are to contact me if the confusional episodes continue.   CT head 08/22/2013:  IMPRESSION:  Abnormal CT head (without) demonstrating: 1. Stable bilateral frontal subdural hygromas (55mm). 2. Stable ventriculomegaly of the lateral and third ventricles. 3. Bilateral frontal intra-ventricular catheters via burr hole craniotomies. 4. No significant change from CT on 04/29/12.

## 2013-08-22 NOTE — Telephone Encounter (Signed)
I called the patient, talked with the wife. The patient took his usual 750 mg Keppra tablets and the 1000 mg new Keppra tablets together this morning. He should be okay, may be sleepy throughout the day. He will take a 750 mg tablet this evening, and then tomorrow start 1000 mg twice daily.

## 2013-08-28 ENCOUNTER — Telehealth: Payer: Self-pay | Admitting: Neurology

## 2013-08-28 NOTE — Telephone Encounter (Signed)
Pt calling for CT scan results. Dr. [name] out of the office, sending to [facility], Dr. Leta Baptist, please advise. Thanks

## 2013-08-28 NOTE — Telephone Encounter (Signed)
Patient's wife requesting CT scan results for patient, states that we need to call her for results because patient will not remember if we have called him. Please return call and advise.

## 2013-08-28 NOTE — Telephone Encounter (Signed)
Let pt know that CT is unchanged from last CT in 2014. Continue current plan. -VRP

## 2013-08-28 NOTE — Telephone Encounter (Signed)
Noted  

## 2013-08-29 NOTE — Telephone Encounter (Signed)
Called pt and spoke with pt's wife [name] informing her per Dr. Gaspar Bidding, [facility], that the pt's CT scan is unchanged from last CT in 2014 and to continue current plan. Wife stated that she would like Dr. [name] to give her a call back when he gets back in the office concerning what Dr. [name] had discuss with her about the pt having a abnormality result on the Lt side of his brain. I advised the wife that if the pt has any other problems, questions or concerns to call the office. Wife verbalized understanding.

## 2013-08-29 NOTE — Telephone Encounter (Signed)
Please advise previous note. Thanks  °

## 2013-09-13 ENCOUNTER — Other Ambulatory Visit: Payer: Self-pay | Admitting: Neurology

## 2013-09-18 ENCOUNTER — Ambulatory Visit: Payer: BC Managed Care – PPO | Admitting: Nurse Practitioner

## 2013-10-02 ENCOUNTER — Telehealth: Payer: Self-pay | Admitting: Neurology

## 2013-10-02 NOTE — Telephone Encounter (Signed)
Patient's spouse calling for Rx refill for levETIRAcetam (KEPPRA) 1000 MG tablet.  Stated he usually takes 3 x's a day, but Rx read 2 x's a day.  Requesting Rx should indicate 3x's a day.   Patient completely out of medication.  Please call anytime and leave detailed message if not available.

## 2013-10-02 NOTE — Telephone Encounter (Signed)
I called back.  Spoke with [name].  She apologized and said she looked at the wrong medication in error.  Indicates the patient is in fact taking one twice daily, not one three times daily.  Says they are okay and nothing further is needed at this time.

## 2013-12-25 ENCOUNTER — Ambulatory Visit: Payer: BC Managed Care – PPO | Admitting: Adult Health

## 2014-01-01 ENCOUNTER — Encounter (HOSPITAL_COMMUNITY): Payer: Self-pay | Admitting: Internal Medicine

## 2014-01-07 ENCOUNTER — Ambulatory Visit (INDEPENDENT_AMBULATORY_CARE_PROVIDER_SITE_OTHER): Payer: BC Managed Care – PPO | Admitting: Adult Health

## 2014-01-07 ENCOUNTER — Encounter: Payer: Self-pay | Admitting: Adult Health

## 2014-01-07 VITALS — BP 118/81 | HR 67 | Temp 96.6°F | Ht 77.0 in | Wt 334.0 lb

## 2014-01-07 DIAGNOSIS — R569 Unspecified convulsions: Secondary | ICD-10-CM

## 2014-01-07 DIAGNOSIS — R413 Other amnesia: Secondary | ICD-10-CM

## 2014-01-07 NOTE — Patient Instructions (Signed)
Continue taking Keppra 1000 mg BID.  If your confusion and memory issues progress or get worse we please let us know.   Levetiracetam tablets What is this medicine? LEVETIRACETAM (lee ve tye RA se tam) is an antiepileptic drug. It is used with other medicines to treat certain types of seizures. This medicine may be used for other purposes; ask your health care provider or pharmacist if you have questions. COMMON BRAND NAME(S): Keppra What should I tell my health care provider before I take this medicine? They need to know if you have any of these conditions: -kidney disease -suicidal thoughts, plans, or attempt; a previous suicide attempt by you or a family member -an unusual or allergic reaction to levetiracetam, other medicines, foods, dyes, or preservatives -pregnant or trying to get pregnant -breast-feeding How should I use this medicine? Take this medicine by mouth with a glass of water. Follow the directions on the prescription label. Swallow the tablets whole. Do not crush or chew this medicine. You may take this medicine with or without food. Take your doses at regular intervals. Do not take your medicine more often than directed. Do not stop taking this medicine or any of your seizure medicines unless instructed by your doctor or health care professional. Stopping your medicine suddenly can increase your seizures or their severity. A special MedGuide will be given to you by the pharmacist with each prescription and refill. Be sure to read this information carefully each time. Contact your pediatrician or health care professional regarding the use of this medication in children. While this drug may be prescribed for children as young as 60 years of age for selected conditions, precautions do apply. Overdosage: If you think you have taken too much of this medicine contact a poison control center or emergency room at once. NOTE: This medicine is only for you. Do not share this medicine with  others. What if I miss a dose? If you miss a dose, take it as soon as you can. If it is almost time for your next dose, take only that dose. Do not take double or extra doses. What may interact with this medicine? This medicine may interact with the following medications: -carbamazepine -colesevelam -probenecid -sevelamer This list may not describe all possible interactions. Give your health care provider a list of all the medicines, herbs, non-prescription drugs, or dietary supplements you use. Also tell them if you smoke, drink alcohol, or use illegal drugs. Some items may interact with your medicine. What should I watch for while using this medicine? Visit your doctor or health care professional for a regular check on your progress. Wear a medical identification bracelet or chain to say you have epilepsy, and carry a card that lists all your medications. It is important to take this medicine exactly as instructed by your health care professional. When first starting treatment, your dose may need to be adjusted. It may take weeks or months before your dose is stable. You should contact your doctor or health care professional if your seizures get worse or if you have any new types of seizures. You may get drowsy or dizzy. Do not drive, use machinery, or do anything that needs mental alertness until you know how this medicine affects you. Do not stand or sit up quickly, especially if you are an older patient. This reduces the risk of dizzy or fainting spells. Alcohol may interfere with the effect of this medicine. Avoid alcoholic drinks. The use of this medicine may increase the  chance of suicidal thoughts or actions. Pay special attention to how you are responding while on this medicine. Any worsening of mood, or thoughts of suicide or dying should be reported to your health care professional right away. Women who become pregnant while using this medicine may enroll in the [place] Pregnancy Registry by calling [phone]. This registry collects information about the safety of antiepileptic drug use during pregnancy. What side effects may I notice from receiving this medicine? Side effects you should report to your doctor or health care professional as soon as possible: -allergic reactions like skin rash, itching or hives, swelling of the face, lips, or tongue -breathing problems -dark urine -general ill feeling or flu-like symptoms -problems with balance, talking, walking -unusually weak or tired -worsening of mood, thoughts or actions of suicide or dying -yellowing of the eyes or skin Side effects that usually do not require medical attention (report to your doctor or health care professional if they continue or are bothersome): -diarrhea -dizzy, drowsy -headache -loss of appetite This list may not describe all possible side effects. Call your doctor for medical advice about side effects. You may report side effects to FDA at 1-800-FDA-1088. Where should I keep my medicine? Keep out of reach of children. Store at room temperature between 15 and 30 degrees C (59 and 86 degrees F). Throw away any unused medicine after the expiration date. NOTE: This sheet is a summary. It may not cover all possible information. If you have questions about this medicine, talk to your doctor, pharmacist, or health care provider.  2015, [employer] Standard. (2012-12-03 08:42:48)

## 2014-01-07 NOTE — Progress Notes (Signed)
PATIENT: [name] DOB: [birth year]  REASON FOR VISIT: follow up HISTORY FROM: patient  HISTORY OF PRESENT ILLNESS: [name] is a 48 year old male with a history of a VP shunt and seizures. He returns today for follow-up. He is currently taking Keppra 1000 mg twice a day. He is tolerating this medication well. Denies any seizures since the last visit. He is able to complete all ADLs independently. He does not operate a motor vehicle. Patient feels that his memory has worsened. He states that he forgets to do things around the house that he is suppose to do such as wash dishes, take the trash out etc.. He will sometimes forget where his wife is and she will be at work. He does feel that he is "confused" more. However he does feel that his Keppra has helped his seizures. Patient continues to have issues with his balance. He recently broke two bones in the left foot but it was not due to a fall or loss of balance. He denies any recent falls. He is currently wearing a foot cast/brace.   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 The first shunt was placed in 2009. The patient has a history of seizure-type events that predated the VP shunt placement. He will have events of shaking on the arms and head that may last several minutes. The last such episode was 18 months ago. The patient is on Keppra taking 750 mg twice daily, and he is tolerating the medication well. The patient indicates that within the last 6 weeks, he has had an increased frequency of episodes of confusion lasting 2-4 minutes. The wife indicates that he will do unusual things during the episodes, such as placing milk on top of the refrigerator instead in it. The patient had an event recently where he could not remember his name at the dentist's office. He indicates that he is now having increased frequency of headaches occurring  up to 3 times a day, and he takes oxycodone and ibuprofen for this. He has noted some change in balance, and he has not had any falls, but he will occasionally stumble. He has had urgency over the last several months with the bowels and the bladder that is new for him. The patient denies any nausea or vomiting. He returns to this office for an evaluation. The last CT scan of brain was done in April of 2014, and it showed some improvement in ventricular size from a prior study  REVIEW OF SYSTEMS: Out of a complete 14 system review of symptoms, the patient complains only of the following symptoms, and all other reviewed systems are negative.  Unexpected weight change Ringing in ears Excessive thirst Excessive eating Restless leg, daytime sleepiness, snoring Agitation, confusion, depression, nervous/anxious, hallucinations, hyperactive Memory loss, tremors   ALLERGIES: Allergies  Allergen Reactions  . Omnipaque [Iohexol] Hives  . Prednisone Nausea And Vomiting and Other (See Comments)    unknown    HOME MEDICATIONS: Outpatient Prescriptions Prior to Visit  Medication Sig Dispense Refill  . aspirin 81 MG tablet Take 81 mg by mouth daily.      . Coconut Oil OIL 1 Syringe by Does not apply route daily.    . fludrocortisone (FLORINEF) 0.1 MG tablet Take 0.2 mg by mouth 2 (two) times daily.     . hydrocortisone (CORTEF) 10 MG tablet Take 10 mg by mouth daily.    Marland Kitchen levETIRAcetam (  KEPPRA) 1000 MG tablet Take 1 tablet (1,000 mg total) by mouth 2 (two) times daily. 180 tablet 3  . lisinopril (PRINIVIL,ZESTRIL) 20 MG tablet Take 20 mg by mouth daily.    . Melatonin 1 MG CAPS Take 1 capsule by mouth daily.    . Misc Natural Products (NF FORMULAS TESTOSTERONE) CAPS Take 3 capsules by mouth daily.    . pramipexole (MIRAPEX) 0.125 MG tablet TAKE ONE TABLET BY MOUTH TWICE DAILY 60 tablet 11  . Sod Fluoride-Potassium Nitrate 1.1-5 % PSTE Place 1 application onto teeth 2 (two) times daily.    Marland Kitchen  venlafaxine XR (EFFEXOR-XR) 75 MG 24 hr capsule TAKE ONE CAPSULE BY MOUTH THREE TIMES DAILY 90 capsule 6   No facility-administered medications prior to visit.    PAST MEDICAL HISTORY: Past Medical History  Diagnosis Date  . Hydrocephalus   . Seizure   . Chiari I malformation   . Atrial fibrillation   . Cardiomyopathy   . Pulmonary embolism   . SIADH (syndrome of inappropriate ADH production)   . Salt-wasting syndrome of infancy   . Coronary artery disease   . Seizures   . Depression   . Anxiety   . Carpal tunnel syndrome   . Headache(784.0)   . Syncope     Recurrent, thought to be neurally mediated  . Renal calculi   . Obstructive sleep apnea   . Restless leg syndrome   . Carpal tunnel syndrome   . Aqueductal stenosis   . Memory loss     PAST SURGICAL HISTORY: Past Surgical History  Procedure Laterality Date  . Ventriculoperitoneal shunt    . Cholecystectomy    . St. jude dm [id number] loop      S/p loop recorder  . Loop recorder explant N/A 10/30/2012    Procedure: LOOP RECORDER EXPLANT;  Surgeon: Deboraha Sprang, MD;  Location: [facility] CATH LAB;  Service: Cardiovascular;  Laterality: N/A;    FAMILY HISTORY: Family History  Problem Relation Age of Onset  . Migraines Mother   . Seizures Neg Hx     SOCIAL HISTORY: History   Social History  . Marital Status: Married    Spouse Name: [name]    Number of Children: 0  . Years of Education: 12   Occupational History  . CUSTOMER SERVICE [employer]   Social History Main Topics  . Smoking status: Never Smoker   . Smokeless tobacco: Not on file  . Alcohol Use: No  . Drug Use: No  . Sexual Activity: Not on file   Other Topics Concern  . Not on file   Social History Narrative   Patient is married [name]), no children   Patient is right handed   Education level is high school graduate   Caffeine consumption is 1/2 of 2L daily      PHYSICAL EXAM  Filed Vitals:   01/07/14 1349  BP: 118/81  Pulse: 67    Temp: 96.6 F (35.9 C)  TempSrc: Oral  Height: 6\' 5"  (1.956 m)  Weight: 334 lb (151.501 kg)   Body mass index is 39.6 kg/(m^2).  Generalized: Well developed, in no acute distress   Neurological examination  Mentation: Alert oriented to time, place, history taking. Follows all commands speech and language fluent. MMSE 29/30 Cranial nerve II-XII: Pupils were equal round reactive to light. Extraocular movements were full, visual field were full on confrontational test. Facial sensation and strength were normal. Uvula tongue midline. Head turning and shoulder shrug  were normal  and symmetric. Motor: The motor testing reveals 5 over 5 strength of all 4 extremities. Good symmetric motor tone is noted throughout.  Sensory: Sensory testing is intact to soft touch on all 4 extremities. No evidence of extinction is noted.  Coordination: Cerebellar testing reveals good finger-nose-finger and heel-to-shin bilaterally.  Gait and station: Patient has a slightly limping gait on the left due to a foot boot.. Reflexes: Deep tendon reflexes are symmetric and normal bilaterally.    DIAGNOSTIC DATA (LABS, IMAGING, TESTING) - I reviewed patient records, labs, notes, testing and imaging myself where available.  Lab Results  Component Value Date   WBC 10.0 08/07/2011   HGB 13.9 08/07/2011   HCT 41.0 08/07/2011   MCV 83.5 08/07/2011   PLT 304 08/07/2011      Component Value Date/Time   NA 137 08/07/2011 0052   K 4.1 08/07/2011 0052   CL 100 08/07/2011 0052   CO2 27 02/16/2011 2056   GLUCOSE 88 08/07/2011 0052   BUN 12 08/07/2011 0052   CREATININE 0.90 08/07/2011 0052   CALCIUM 10.2 02/16/2011 2056   PROT 7.7 02/16/2011 2056   ALBUMIN 4.0 02/16/2011 2056   AST 30 02/16/2011 2056   ALT 38 02/16/2011 2056   ALKPHOS 81 02/16/2011 2056   BILITOT 0.4 02/16/2011 2056   GFRNONAA >90 02/16/2011 2056   GFRAA >90 02/16/2011 2056       ASSESSMENT AND PLAN 48 y.o. year old male  has a past  medical history of Hydrocephalus; Seizure; Chiari I malformation; Atrial fibrillation; Cardiomyopathy; Pulmonary embolism; SIADH (syndrome of inappropriate ADH production); Salt-wasting syndrome of infancy; Coronary artery disease; Seizures; Depression; Anxiety; Carpal tunnel syndrome; Headache(784.0); Syncope; Renal calculi; Obstructive sleep apnea; Restless leg syndrome; Carpal tunnel syndrome; Aqueductal stenosis; and Memory loss. here with:  1. Seizures 2. Memory deficit  The patient's seizures have been controlled with Keppra. He denies having any additional seizures since the last visit. He will continue taking Keppra 1000 mg twice a day. The patient continues to have issues with his memory. The patient's memory score has remained stable. The patient had a CT scan at the last visit that was stable compared to previous scans. We will continue to monitor the patient's memory over time. If his symptoms worsen or he develops new symptoms he should let us know. Otherwise he will follow up in 3 months.   Ward Givens, MSN, NP-C 01/07/2014, 2:15 PM [facility] [address], [place], [place] [address] [phone]  Note: This document was prepared with digital dictation and possible smart phrase technology. Any transcriptional errors that result from this process are unintentional.

## 2014-01-07 NOTE — Progress Notes (Signed)
I have read the note, and I agree with the clinical assessment and plan.  Samora Jernberg KEITH   

## 2014-04-08 ENCOUNTER — Ambulatory Visit: Payer: BC Managed Care – PPO | Admitting: Adult Health

## 2014-04-15 ENCOUNTER — Ambulatory Visit: Payer: Self-pay | Admitting: Adult Health

## 2014-05-06 ENCOUNTER — Ambulatory Visit: Payer: Self-pay | Admitting: Adult Health

## 2014-05-07 ENCOUNTER — Encounter: Payer: Self-pay | Admitting: Adult Health

## 2014-05-07 ENCOUNTER — Ambulatory Visit (INDEPENDENT_AMBULATORY_CARE_PROVIDER_SITE_OTHER): Payer: BLUE CROSS/BLUE SHIELD | Admitting: Adult Health

## 2014-05-07 VITALS — BP 166/100 | HR 76 | Ht 77.0 in | Wt 350.0 lb

## 2014-05-07 DIAGNOSIS — R569 Unspecified convulsions: Secondary | ICD-10-CM | POA: Diagnosis not present

## 2014-05-07 DIAGNOSIS — R413 Other amnesia: Secondary | ICD-10-CM

## 2014-05-07 NOTE — Patient Instructions (Signed)
Continue Keppra I will send you for neuropsychological testing.  If your symptoms worsen please let us know.

## 2014-05-07 NOTE — Progress Notes (Signed)
I have read the note, and I agree with the clinical assessment and plan.  WILLIS,CHARLES KEITH   

## 2014-05-07 NOTE — Progress Notes (Signed)
PATIENT: [name] DOB: 01-Feb-1965  REASON FOR VISIT: follow up- seizures, memory disturbance HISTORY FROM: patient  HISTORY OF PRESENT ILLNESS: [name] is a 49 year old male with a history of VP shunt and seizures and memory disturbance. He returns today for follow-up. The patient is currently taking Keppra and tolerating it well. He denies having any seizure since the last visit. The patient feels that his memory has stayed about the same. However his wife states that he has days where it is worse. The patient continues to have trouble remembering things that he's been told to do. Wife states that he also has a hard time processing information. She states that she will tell him something but the way he processes it is not what she actually said. Patient and his wife state that the memory issues started after his second shunt surgery. At home he is able to complete all ADLs independently. He does not operate a motor vehicle. He is able to cook meals without difficulty. Patient states that his gait and balance has been an issue since his surgeries. He denies any recent falls. Patient is most concerned today about his memory loss. He returns for an evaluation. HISTORY 01/07/14: [name] is a 49 year old male with a history of a VP shunt and seizures. He returns today for follow-up. He is currently taking Keppra 1000 mg twice a day. He is tolerating this medication well. Denies any seizures since the last visit. He is able to complete all ADLs independently. He does not operate a motor vehicle. Patient feels that his memory has worsened. He states that he forgets to do things around the house that he is suppose to do such as wash dishes, take the trash out etc.. He will sometimes forget where his wife is and she will be at work. He does feel that he is "confused" more. However he does feel that his Keppra has helped his seizures. Patient continues to have issues with his balance. He  recently broke two bones in the left foot but it was not due to a fall or loss of balance. He denies any recent falls. He is currently wearing a foot cast/brace.   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 The first shunt was placed in 2009. The patient has a history of seizure-type events that predated the VP shunt placement. He will have events of shaking on the arms and head that may last several minutes. The last such episode was 18 months ago. The patient is on Keppra taking 750 mg twice daily, and he is tolerating the medication well. The patient indicates that within the last 6 weeks, he has had an increased frequency of episodes of confusion lasting 2-4 minutes. The wife indicates that he will do unusual things during the episodes, such as placing milk on top of the refrigerator instead in it. The patient had an event recently where he could not remember his name at the dentist's office. He indicates that he is now having increased frequency of headaches occurring up to 3 times a day, and he takes oxycodone and ibuprofen for this. He has noted some change in balance, and he has not had any falls, but he will occasionally stumble. He has had urgency over the last several months with the bowels and the bladder that is new for him. The patient denies any nausea or vomiting. He returns to this office for an  evaluation. The last CT scan of brain was done in April of 2014, and it showed some improvement in ventricular size from a prior study  REVIEW OF SYSTEMS: Out of a complete 14 system review of symptoms, the patient complains only of the following symptoms, and all other reviewed systems are negative.  ALLERGIES: Allergies  Allergen Reactions  . Omnipaque [Iohexol] Hives  . Prednisone Nausea And Vomiting and Other (See Comments)    unknown    HOME MEDICATIONS: Outpatient Prescriptions Prior to  Visit  Medication Sig Dispense Refill  . aspirin 81 MG tablet Take 81 mg by mouth daily.      . Coconut Oil OIL 1 Syringe by Does not apply route daily.    . fludrocortisone (FLORINEF) 0.1 MG tablet Take 0.2 mg by mouth 2 (two) times daily.     . hydrocortisone (CORTEF) 10 MG tablet Take 10 mg by mouth daily.    Marland Kitchen levETIRAcetam (KEPPRA) 1000 MG tablet Take 1 tablet (1,000 mg total) by mouth 2 (two) times daily. 180 tablet 3  . lisinopril (PRINIVIL,ZESTRIL) 20 MG tablet Take 20 mg by mouth daily.    . Melatonin 1 MG CAPS Take 1 capsule by mouth daily.    . Misc Natural Products (NF FORMULAS TESTOSTERONE) CAPS Take 3 capsules by mouth daily.    . pramipexole (MIRAPEX) 0.125 MG tablet TAKE ONE TABLET BY MOUTH TWICE DAILY 60 tablet 11  . Sod Fluoride-Potassium Nitrate 1.1-5 % PSTE Place 1 application onto teeth 2 (two) times daily.    Marland Kitchen venlafaxine XR (EFFEXOR-XR) 75 MG 24 hr capsule TAKE ONE CAPSULE BY MOUTH THREE TIMES DAILY 90 capsule 6   No facility-administered medications prior to visit.    PAST MEDICAL HISTORY: Past Medical History  Diagnosis Date  . Hydrocephalus   . Seizure   . Chiari I malformation   . Atrial fibrillation   . Cardiomyopathy   . Pulmonary embolism   . SIADH (syndrome of inappropriate ADH production)   . Salt-wasting syndrome of infancy   . Coronary artery disease   . Seizures   . Depression   . Anxiety   . Carpal tunnel syndrome   . Headache(784.0)   . Syncope     Recurrent, thought to be neurally mediated  . Renal calculi   . Obstructive sleep apnea   . Restless leg syndrome   . Carpal tunnel syndrome   . Aqueductal stenosis   . Memory loss     PAST SURGICAL HISTORY: Past Surgical History  Procedure Laterality Date  . Ventriculoperitoneal shunt    . Cholecystectomy    . St. jude dm [id number] loop      S/p loop recorder  . Loop recorder explant N/A 10/30/2012    Procedure: LOOP RECORDER EXPLANT;  Surgeon: Deboraha Sprang, MD;  Location: [facility] CATH  LAB;  Service: Cardiovascular;  Laterality: N/A;    FAMILY HISTORY: Family History  Problem Relation Age of Onset  . Migraines Mother   . Seizures Neg Hx     SOCIAL HISTORY: History   Social History  . Marital Status: Married  4  Spouse Name: [name]  . Number of Children: 0  . Years of Education: 12   Occupational History  . CUSTOMER SERVICE [employer]   Social History Main Topics  . Smoking status: Never Smoker   . Smokeless tobacco: Not on file  . Alcohol Use: No  . Drug Use: No  . Sexual Activity: Not on file   Other Topics  Concern  . Not on file   Social History Narrative   Patient is married [name]), no children   Patient is right handed   Education level is high school graduate   Caffeine consumption is 1/2 of 2L daily      PHYSICAL EXAM  Filed Vitals:   05/07/14 0938  BP: 166/100  Pulse: 76  Height: 6\' 5"  (1.956 m)  Weight: 350 lb (158.759 kg)   Body mass index is 41.5 kg/(m^2).  Generalized: Well developed, in no acute distress   Neurological examination  Mentation: Alert oriented to time, place, history taking. Follows all commands speech and language fluent. MMSE 29/30 Cranial nerve II-XII: Pupils were equal round reactive to light. Extraocular movements were full, visual field were full on confrontational test. Facial sensation and strength were normal. Uvula tongue midline. Head turning and shoulder shrug  were normal and symmetric. Motor: The motor testing reveals 5 over 5 strength of all 4 extremities. Good symmetric motor tone is noted throughout.  Sensory: Sensory testing is intact to soft touch on all 4 extremities. No evidence of extinction is noted.  Coordination: Cerebellar testing reveals good finger-nose-finger and heel-to-shin bilaterally.  Gait and station: Gait is normal. Tandem gait is slightly unsteady. Romberg is negative but unsteady. No drift is seen.  Reflexes: Deep tendon reflexes are symmetric and normal bilaterally.  Marland Kitchen     DIAGNOSTIC DATA (LABS, IMAGING, TESTING) - I reviewed patient records, labs, notes, testing and imaging myself where available.      ASSESSMENT AND PLAN 49 y.o. year old male  has a past medical history of Hydrocephalus; Seizure; Chiari I malformation; Atrial fibrillation; Cardiomyopathy; Pulmonary embolism; SIADH (syndrome of inappropriate ADH production); Salt-wasting syndrome of infancy; Coronary artery disease; Seizures; Depression; Anxiety; Carpal tunnel syndrome; Headache(784.0); Syncope; Renal calculi; Obstructive sleep apnea; Restless leg syndrome; Carpal tunnel syndrome; Aqueductal stenosis; and Memory loss. here with:  1. Seizures 2. Memory disturbance  The patient seizures have been controlled with Keppra. The patient will continue taking Keppra 1000mg  twice a day. His memory score has also remained stable. Patient is concerned if his memory truly came from surgery or if something else is occurring. Recommended neuropsychological testing. The patient is amenable to this. The patient had a CT scan in July 2015 to evaluate his shunts. It showed no significant changes from the prior scan. The CT scan may need to be repeated. Patient advised that if his symptoms worsen or he develops new symptoms he should let us know. Otherwise he will follow-up in 4 months or sooner if needed.   Ward Givens, MSN, NP-C 05/07/2014, 9:38 AM [facility] [address], [place], [place] [address] [phone]  Note: This document was prepared with digital dictation and possible smart phrase technology. Any transcriptional errors that result from this process are unintentional.

## 2014-05-14 ENCOUNTER — Other Ambulatory Visit: Payer: Self-pay | Admitting: Neurology

## 2014-05-14 NOTE — Telephone Encounter (Signed)
Previously prescribed

## 2014-05-18 ENCOUNTER — Other Ambulatory Visit: Payer: Self-pay | Admitting: Neurology

## 2014-07-24 ENCOUNTER — Other Ambulatory Visit: Payer: Self-pay

## 2014-07-24 MED ORDER — LEVETIRACETAM 1000 MG PO TABS: 1000.0000 mg | ORAL_TABLET | Freq: Two times a day (BID) | ORAL | Status: DC

## 2014-08-11 ENCOUNTER — Ambulatory Visit: Payer: BLUE CROSS/BLUE SHIELD | Attending: Psychology | Admitting: Psychology

## 2014-08-11 DIAGNOSIS — F0280 Dementia in other diseases classified elsewhere without behavioral disturbance: Secondary | ICD-10-CM

## 2014-08-11 DIAGNOSIS — Q039 Congenital hydrocephalus, unspecified: Secondary | ICD-10-CM | POA: Diagnosis not present

## 2014-08-11 DIAGNOSIS — F329 Major depressive disorder, single episode, unspecified: Secondary | ICD-10-CM | POA: Insufficient documentation

## 2014-08-11 NOTE — Progress Notes (Addendum)
[facility]  [address]   Telephone [phone] [address] Fax [phone] [place], [place] [address]  Initial Contact Note  Name:  [name] [facility] Date of Birth; February 22, 1965 MRN:  [id number] Date:  08/11/2014  [name] is an 49 y.o. male with a history of congenital hydrocephalus who has reported persisting cognitive difficulties following a VP shunt revision in 2011. He was referred for neuropsychological evaluation by Ward Givens, NP and Floyde Parkins MD of [facility].    A total of 5 hours was spent today reviewing medical records, interviewing (CPT [phone]) [place] and his wife, and administering and scoring neurocognitive tests (CPT [phone]).  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   A full report will follow once the planned testing has been completed. His next appointment is scheduled for 08/18/14.   Jamey Ripa, Ph.D Licensed Psychologist 08/11/2014

## 2014-08-18 ENCOUNTER — Ambulatory Visit (INDEPENDENT_AMBULATORY_CARE_PROVIDER_SITE_OTHER): Payer: BLUE CROSS/BLUE SHIELD | Admitting: Psychology

## 2014-08-18 DIAGNOSIS — Q039 Congenital hydrocephalus, unspecified: Secondary | ICD-10-CM

## 2014-08-18 DIAGNOSIS — F0280 Dementia in other diseases classified elsewhere without behavioral disturbance: Secondary | ICD-10-CM | POA: Diagnosis not present

## 2014-08-18 DIAGNOSIS — F329 Major depressive disorder, single episode, unspecified: Secondary | ICD-10-CM | POA: Diagnosis not present

## 2014-08-18 DIAGNOSIS — F32A Depression, unspecified: Secondary | ICD-10-CM

## 2014-08-18 NOTE — Progress Notes (Addendum)
[facility]  [address]   Telephone [phone] [address] Fax [phone], [place] [address]   [place]* This report should not be released without the consent of the client  Name:   Nikia Mangino. 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 He has undergone ventriculoperitoneal shunt placements in 2009 and again in 2011 following a shunt malfunction. Both Mr. Fontan and his wife have reported that his memory functioning declined after the second shunt placement. He has a history of seizures that has been well controlled since starting on Keppra in 2013. A CT of the head on 08/22/13 demonstrated stable bilateral frontal subdural hygromas and stable ventriculomegaly of the lateral and third ventricles. He was referred for neuropsychological evaluation by Ward Givens, NP and Floyde Parkins, MD of [facility] for an assessment of his cognitive functioning.   Sources of Information Electronic medical records from the [facility] were reviewed. Mr. Kneece and his wife, Ms. Georgina Gaynor, were interviewed.    Chief Complaints & Current Status According to his wife, after the first shunt placement surgery in 2009 her husband demonstrated relatively mild memory difficulties. For example, he might remember he had an appointment but not the time or the place. She stated that after the second shunt surgery in 2011 he displayed a dramatic decline in memory. She reported that at this time he rapidly forgets new information within seconds to minutes, has difficulty following directions of more than two steps,  mishears or miscomprehends what people say to him, is prone to lose personal items and gets lost easily when outside  the home (even in familiar locations). 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 He has otherwise not appeared anxious or expressed other fears. The only times she has seen him become angry have been during visits to the [employer] he used to work at as triggered by seeing a miss-shelved item or when he has perceived a customer to shoplift. When asked about psychotic symptoms, she reported that he sometimes claims that he has heard someone say something when the person did not speak. She added that sometimes when visiting [employer] he has claimed to have seen a former co-worker there who no longer works there. He has not expressed any unusual or bizarre thoughts. She has not observed him to engage in unsafe, non-compliant or inappropriate behavior.     Mr. Lesiak agreed that his memory has been poor since his second shunt surgery. He also reported being easily fatigued, problems with balance, bimanual tremors (left > right), bowel and bladder incontinence (since 2009 but with decreased frequency since second shunt surgery) and weight gain of approximately seventy five pounds over the past two years. With regards to his emotional functioning, he described himself as "frustrated" by his memory loss and sometimes dwelling on negative thoughts about himself. He reported that since his second shunt surgery he has been prone to cry "at the drop of a hat" to evocative stimuli (e.g., a photo of a former pet) though his tearfulness has been usually short-lived. He did not report experiencing persisting sadness, anhedonia  or suicidal thoughts. He acknowledged worrying about his wife's safety while she is at work to the point of rumination. He otherwise did not report feeling anxious. He did not report hallucinations or delusions.  With  regards to his daily functioning, for the most part he manages independently within his home. He chooses appropriate clothing and dresses himself without difficulty. His wife reported that he needs reminders to bathe regularly as he usually initiates taking a shower two or three times per week. He has not remembered to perform daily chores around the house. She stated that he usually does not remember to check a to-do list she posts on the refrigerator. He has been able to prepare simple meals for himself though on occasion has burnt food in the toaster oven. He tends to eat throughout the day. He stated that he eats out of boredom and usually cannot recollect when he last ate. According to his wife, he has not been able to consistently use the television remote control. He has been able to wash clothes in the washing machine but has not folded clothes properly despite repeated demonstrations. He has not been able to shop at a grocery store due to forgetting what to buy. He no longer makes purchases on his own as he has forgotten which credit card to use and cannot keep track of how much money is in each account. He spends most of his time home alone. A few times a week he visits his parents who live nearby. His wife has been bringing him to the [employer] he used to work at so he can walk the aisles for exercise. He has often gotten lost within that store. She reported that he becomes confused in community settings. He has not driven since 0350.   Background He and his wife have been married for thirteen years. She has seven children from a prior marriage. He has no biological children.   He had been employed with [employer] for twenty five years until he was terminated in 2014, by his wife's report due to his inability to remember work procedures. 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   He reported that he was graduated from high school without attentional or learning problems. He recalled that his grades declined in the eighth grade.   He joined the [employer] after graduation but was discharged after two months due by his report to enuresis.   In addition to hydrocephalus with seizure activity, his past medical history was notable for atrial fibrillation, cardiomyopathy, carpal tunnel syndrome, Chiari I malformation, coronary artery disease, headache, obstructive sleep apnea, restless leg syndrome, syndrome of inappropriate ADH production and syncopal episodes.   He reported no history of head injury, loss of consciousness, stroke-like symptoms, toxic exposure or substance abuse.   His current medications include aspirin, fludrocortisone, hydrocortisone, levetiracetam, lisinopril and venlafaxine XR.  There is no known family history of neurological or psychiatric disorder.   He reported no history of ongoing or serious emotional difficulties. His wife reported that he saw a mental health provider about six years ago upon referral from his primary care physician because he was feeling depressed. (Mr. Daversa reported no recollection of this). He met with that person over a three month period apparently for counseling. She reported that he has never been  prescribed psychiatric medication.   She reported that he has no past or current legal issues. His wife stated that his application for social security disability has been twice denied and is under appeal.    Observations He appeared as an appropriately dressed and groomed overweight man in no apparent distress. His gait and posture appeared normal. He did not exhibit any unusual mannerisms or motor activity. He related in a pleasant, polite and cooperative manner. His affect appeared within a wide range and was congruent with his thoughts. He did not show signs of  emotional distress. His speech was well articulated, fluent and of normal prosody. His word usage seemed normal. He had no apparent problems understanding what was said to him. He tended to repeat himself. He had difficulty providing information about recent events; and he deferred to his wife to answer most questions. His thought processes were coherent and organized. There was no evidence of confusion, bizarre or unusual thought content, or delusional thinking.  Evaluation Procedures In addition to review of medical records and clinical interviews, the following tests or questionnaires were administered:  Animal Naming Test [facility] Anxiety Inventory  [facility] Naming Test Complex Ideational Material subtest of the [facility] Diagnostic Aphasia Exam Controlled Oral Word Association Test Finger Tapping Test Grooved Pegboard Test Hooper Test of Visual Organization Patient Health Questionnaire Personality Assessment Screener  Rey Complex Figure: Copy Rey 15-Item Memory Test Trail Making A & B Wechsler Adult Intelligence Scale-IV:  Music therapist, Coding, Digit Span, Matrix Reasoning, Similarities & Vocabulary Wechsler Memory Scale-IV Flexible Approach Wide Range Achievement Test-4: Word Reading [employer] Test  Test Results Validity & Interpretative Considerations It was concluded that the test results represented a valid measure of his cognitive functioning. He did not display signs of inattention or emotional distress. He did not report or display problems with vision or hearing. He exhibited mild tremors of both hands that appeared to reduce the legibility of his drawing and handwriting. He was able to comprehend task instructions without difficulty. He appeared to expend maximal effort. His test-taking effort was confirmed on a validity test (Rey 15-Item Memory Test) that required the immediate drawing of fifteen symbols from memory. Although it is presented as a difficult task, the  redundancy amongst the items reduces the amount of information to be remembered thereby making this a relatively easy task. He reproduced all fifteen symbols.  While his pre-morbid cognitive functioning was estimated to fall within the Low Average range based on his educational and occupational history, he scored within the High Average range on a measure of word reading (Wide Range Achievement Test-4: Word Reading). Word reading tends to be highly resistant to the effects of acquired brain dysfunction and therefore offers a reliable estimate of pre-morbid functioning.  His test scores were corrected to reflect norms for his age and, whenever possible, his gender and educational level (i.e., 12 years). A listing of his test scores can be found at the end of this report.   Intellectual Functioning His current general intellectual functioning (measured on tests with limited demand on attention, working memory or psychomotor speed) was estimated to fall within the Average range on the Wechsler Adult Intelligence Scale-IV (WAIS-IV) based on measures of word definitions (WAIS-IV Vocabulary) and nonverbal reasoning (WAIS-IV Matrix Reasoning).   Speed of Information Processing His speed to transcribe symbols to match digits using a key (WAIS-IV Coding) or to draw lines to connect randomly arrayed numbers in sequence (Trails A) were both within the impaired range.  His speed to complete a complex visual sequence that required maintaining an ascending and alternating sequence of numbers and letters (Trails B) was likewise severely slowed though he did exhibit the ability to shift set, albeit slowly, as he made only one error.    Attention  He appeared to sustain attention to task. His passive auditory attention span, as measured by his ability to repeat strings of digits (WAIS-IV Digit Span Forward), was within mildly impaired range. His working memory span was also subnormal based on his abilities to mentally  rearrange digit sequences in ascending order (WAIS-IV Digit Span Sequencing) or to immediately recognize series of symbols in left to right order (Wechsler Memory Scale-IV (WMS-IV) Symbol Span).   Learning & Memory His learning and memory functioning were seriously impaired as measured on the WMS-IV Flexible Approach Battery. His Immediate Memory Index fell at the 5th percentile. His immediate recall of figural designs was a relative strength within the Average range though his recall of stories read to him was impaired (i.e., 10 units recalled out of a possible 50). His Delayed Memory Index fell within the impaired range at the 2nd percentile. After an approximate thirty minute delay, he exhibited abnormal forgetting of information that he had initially encoded. Notably, he was not able to recall any aspects of the designs he had initially drawn. His performances on a recognition format were also impaired, which indicated that he was unable to effectively store information in memory.   Language His ability to name to confrontation [facility] Naming Test) fell at the upper end of the Low Average range.  Measures of verbal fluency (Animal Naming Test; Controlled Oral Word Association Test) varied between the Low Average to High Average range. He scored within the mildly impaired range on a test of oral language comprehension (Complex Ideational Material subtest) that required him to answer yes/no questions about the properties of objects or about short paragraphs read to him. It is likely that his limited short-term memory capacity compromised his comprehension of paragraph-length information. His fund of word knowledge (WAIS-IV Vocabulary) fell within the Average range. Word reading skill (Wide Range Achievement Test-4) was within the High Average range.   Visual-Spatial Perception & Construction   There were no signs of spatial inattention or problems with visual recognition. Visuospatial conceptualization  and organization was intact based on his abilities to assemble two-dimensional block designs from models Product manager) or identify whole objects from drawings of fragmented parts Chartered loss adjuster). His drawing ability (informal drawings; Rey Complex Figure) appeared to be hampered by his reduced perceptual-motor control secondary to hand tremor.   Fine Motor Skills He reported and displayed consistent right hand preference. As noted above, he displayed a mild tremor that appeared to disrupt his writing and drawing. There were no signs of gross discoordination. His fine motor speed (Finger Tapping Test) was within the Average range for his right hand and within the mildly impaired range for his left hand. His eye-hand speed of dexterity, as assessed by his speed to put grooved pegs into a formboard (Grooved Pegboard Test), was severely slowed for both hands.   Conceptualization & Problem-Solving  Problem-solving was deficient, in large part due to his deficits for information processing speed and working memory span, which are substrate cognitive skills that underpin both moment-to-moment processing as well as complex cognitive processes. On a test of a novel problem-solving task that required the use of ongoing feedback to infer abstract rules to sort geometric designs [facility]  Card Sorting Test) he apparently could not adequately hold a conceptual idea in working memory in order to complete even the initial sorting category. In contrast, his ability to form abstract ideas involving relatively familiar material was relatively intact. His ability to form verbal abstract ideas by classifying ostensibly different objects or ideas to a shared category (WAIS-IV Similarities) was within the Low Average range. His ability to [employer] or symbols in an abstract manner (WAIS-IV Matrix Reasoning) fell within the Average range.   Emotional Functioning The Personality Assessment  Screener (PAS) is a 22 item self-report questionnaire that provides a brief screening to identify the likelihood of emotional or behavioral problems. His total PAS score of 17 was mildly elevated, which suggested that he has a greater than typical potential for clinically significant emotional or behavioral problems. He reported a high degree of negative affect characterized by unhappiness and apprehension. Of note, he did not endorse any symptoms or problems on scales assessing impulse control, persecutory thinking, social withdrawal, alienation from others, suicidal thinking or problems with alcohol use.    On the Patient Health Questionnaire, a screening inventory for depression, his score of 6 fell within the mild range of depression. His most frequently occurring symptoms within the past two weeks reflected vegetative disturbances of sleeping too much and overeating.   On the Beck Anxiety Scale, his score of 31 fell within the severe range. This result likely reflects an overestimate of his actual level of anxiety as he endorsed many somatic symptoms (e.g., hands trembling, wobbliness in legs, unsteadiness) that were probably related to his medical condition.    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 He has undergone ventriculoperitoneal shunt placements in 2009 and 2011.   According to his wife, he displayed a dramatic and persisting decline in memory after the second surgery in 2011 that has resulted in ongoing impairment of his daily functioning marked by limited to no ability to perform instrumental activities of daily living. In addition to memory loss, Mr. Piatkowski reported being easily fatigued, problems with balance, bimanual tremors (L >R), bowel and bladder incontinence (onset in 2009 though with decreased frequency since second shunt surgery) and weight gain. With regards to his emotional and behavioral  functioning, he reported feeling frustrated about his situation with some dwelling on negative thoughts about himself though denied persisting sad mood, anhedonia or suicidal thoughts. He reported that since his second shunt surgery he has experienced heightened emotionality marked by easy to elicit albeit brief crying. He acknowledged being worried about his wife's safety while at work but otherwise did not report ongoing anxiety. There was no report of behavioral dyscontrol or loss of social comportment.   Neuropsychological testing indicated serious deficits for speed of processing, working memory span, Brewing technologist and speed of manipulative dexterity (both likely secondary to hand tremors), verbal learning, memory retention and problem-solving.  In contrast, his access to his fund of acquired verbal information, visuospatial organizational skills, expressive language and ability to form abstract concepts were relatively intact.   In conclusion, his cognitive deficits would be expected to seriously interfere with his ability to engage in independent goal-directed behavior. It is reasonable to attribute his cognitive deficits to his neurological status.   Diagnostic Impressions . 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 Depressive Disorder, unspecified [F32.9]  Recommendations 1. In my opinion, Mr. Thornhill is clearly disabled from competitive employment.  Given that his cognitive functioning is not expected to substantially improve over time, it is unlikely that he will regain sufficient cognitive capacity to work again.   2.  He would benefit from using memory compensatory strategies:  . He was advised to carry a device capable of recording audio messages that he can listen to repeatedly throughout the day to improve his memory. Using written reminders is not suggested given his decreased handwriting speed  and legibility.  . Mr. Lazalde reported that he cannot recall when he had last eaten. He reported significant weight gain over the past two years due to eating throughout the day. It was suggested that his wife post a form on the refrigerator each morning that he can use to check off after finishing each meal that day.  . Mr. Hanway reported experiencing episodes of bladder incontinence. A strategy of programming his cellphone to emit an alarm every two to three hours that would prompt him to attempt to empty his bladder might be helpful in reducing the frequency of his incontinent episodes.   3. With regards to his emotional state, continuation of venlafaxine seems prudent. He was not interested in psychological counseling at this time; and his limited memory carryover would likely limit his ability to benefit from counseling anyway. He was encouraged to maintain engagement in activities that he finds mentally, physically and/or socially stimulating.   4. If he should display significant changes in his cognitive, emotional or behavioral functioning in the future, then a repeat neuropsychological evaluation could be considered to update his neuropsychological functioning and recommendations.  The results and recommendations from this evaluation were discussed with Mr. Grater and his wife on 08/18/14.    I have appreciated the opportunity to evaluate Mr. Kurkowski.  Please feel free to contact me with any comments or questions.    ___________________ Jamey Ripa, Ph.D Licensed Psychologist              ADDENDUM-NEUROPSYCHOLOGICAL TEST RESULTS  Animal Naming Test Score= 15  16th (adjusted for age, gender and educational level)    [place] Naming Test Score= 55/60 24th (adjusted for age, gender and educational level)    Complex Ideational Material Score= 10/12  8th (adjusted for age, gender and educational level)    Controlled Oral Word Association Test Score=  41  words/6 repetitions 46th (adjusted for age, gender and educational level)    Finger Tapping R: 63 42nd (adjusted for age, gender and educational level)  L: 46   7th  (adjusted for age, gender and educational level)    Grooved Pegboard R: 168s <1st (adjusted for age, gender and educational level)  L: 176s <1st (adjusted for age, gender and educational level)    Tree surgeon Organization Score= 28/30 Normal    Rey Complex Figure: copy       Score= 22/36  Mildly impaired, probably secondary to tremor    Rey 15-Item Memory Test Score=  15 /15 Normal      Trails A Score=  59 1e 2nd (adjusted for age, gender and educational level)  Trails B Score= 127s  1e 2nd (adjusted for age, gender and educational level)    Wechsler Adult Intelligence Scale-IV Subtest Scaled Score Percentile  Block Design   9 37th   Similarities   7 16th   Digit Span  Forward               Backward  Sequencing   '5   5   8   5   ' 5th         5th   25th    5th   Matrix Reasoning   8 25th   Vocabulary 11 63rd   Coding     3   1st      Wechsler Memory Scale-IV Flexible Approach Index Index Score Percentile  Immediate Memory 75   5th    Auditory Memory 61 <1st    Visual Memory 70   2nd    Delayed Memory 49 <1st    Symbol Span subtest Scaled score= 3   1st     [place] Card Sorting Test- aborted due to his inability to perform   Wide Range Achievement Test-4 Subtest  Raw score Standard score Percentile  Word Reading 67/70 [place]

## 2014-08-22 ENCOUNTER — Telehealth: Payer: Self-pay | Admitting: Neurology

## 2014-08-22 NOTE — Telephone Encounter (Signed)
The neuropsychological evaluation has been done, the results were discussed with the patient by Dr. Valentina Shaggy. The patient has a RV 09/10/14.   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 He has undergone ventriculoperitoneal shunt placements in 2009 and 2011.   According to his wife, he displayed a dramatic and persisting decline in memory after the second surgery in 2011 that has resulted in ongoing impairment of his daily functioning marked by limited to no ability to perform instrumental activities of daily living. In addition to memory loss, [name] reported being easily fatigued, problems with balance, bimanual tremors (L >R), bowel and bladder incontinence (onset in 2009 though with decreased frequency since second shunt surgery) and weight gain. With regards to his emotional and behavioral functioning, he reported feeling frustrated about his situation with some dwelling on negative thoughts about himself though denied persisting sad mood, anhedonia or suicidal thoughts. He reported that since his second shunt surgery he has experienced heightened emotionality marked by easy to elicit albeit brief crying. He acknowledged being worried about his wife's safety while at work but otherwise did not report ongoing anxiety. There was no report of behavioral dyscontrol or loss of social comportment.   Neuropsychological testing indicated serious deficits for speed of processing, working memory span, Brewing technologist and speed of manipulative dexterity (both likely secondary to hand tremors), verbal learning, memory retention and problem-solving. In contrast, his access to his fund of acquired verbal information, visuospatial organizational skills, expressive language and ability to form abstract concepts were relatively intact.   In conclusion, his cognitive deficits would be expected to seriously interfere with his ability to engage in independent  goal-directed behavior. It is reasonable to attribute his cognitive deficits to his neurological status.   Diagnostic Impressions  Dementia (Major Neurocognitive Disorder) without behavioral disturbance [F02.80] with history of congenital hydrocephalus [Q03.9]  Depressive Disorder, unspecified [F32.9]  Recommendations 1. In my opinion, [name] is clearly disabled from competitive employment. Given that his cognitive functioning is not expected to substantially improve over time, it is unlikely that he will regain sufficient cognitive capacity to work again.  2. He would benefit from using memory compensatory strategies:   He was advised to carry a device capable of recording audio messages that he can listen to repeatedly throughout the day to improve his memory. Using written reminders is not suggested given his decreased handwriting speed and legibility.   [name] reported that he cannot recall when he had last eaten. He reported significant weight gain over the past two years due to eating throughout the day. It was suggested that his wife post a form on the refrigerator each morning that he can use to check off after finishing each meal that day.   [name] reported experiencing episodes of bladder incontinence. A strategy of programming his cellphone to emit an alarm every two to three hours that would prompt him to attempt to empty his bladder might be helpful in reducing the frequency of his incontinent episodes.  3. With regards to his emotional state, continuation of venlafaxine seems prudent. He was not interested in psychological counseling at this time; and his limited memory carryover would likely limit his ability to benefit from counseling anyway. He was encouraged to maintain engagement in activities that he finds mentally, physically and/or socially stimulating.  4. If he should display significant changes in his cognitive, emotional or behavioral  functioning in the future, then a repeat neuropsychological  evaluation could be considered to update his neuropsychological functioning and recommendations.  The results and recommendations from this evaluation were discussed with [name] and his wife on 08/18/14.

## 2014-09-07 ENCOUNTER — Telehealth: Payer: Self-pay

## 2014-09-07 NOTE — Telephone Encounter (Signed)
Called and spoke to patient's mother and she will call back and ask to speak to me to get another apt with Jinny Blossom this week. Patient can still come on Thursday but it will have to be in the am.

## 2014-09-10 ENCOUNTER — Ambulatory Visit: Payer: BLUE CROSS/BLUE SHIELD | Admitting: Adult Health

## 2014-09-10 ENCOUNTER — Encounter: Payer: Self-pay | Admitting: Adult Health

## 2014-09-10 ENCOUNTER — Ambulatory Visit (INDEPENDENT_AMBULATORY_CARE_PROVIDER_SITE_OTHER): Payer: BLUE CROSS/BLUE SHIELD | Admitting: Adult Health

## 2014-09-10 VITALS — BP 154/90 | HR 72 | Ht 77.0 in | Wt 344.0 lb

## 2014-09-10 DIAGNOSIS — R413 Other amnesia: Secondary | ICD-10-CM | POA: Diagnosis not present

## 2014-09-10 DIAGNOSIS — R569 Unspecified convulsions: Secondary | ICD-10-CM | POA: Diagnosis not present

## 2014-09-10 MED ORDER — MEMANTINE HCL 28 X 5 MG & 21 X 10 MG PO TABS: ORAL_TABLET | ORAL | Status: DC

## 2014-09-10 NOTE — Patient Instructions (Signed)
Start Namenda Continue Keppra Taking namenda and mirapex together can sometimes increase level of both drugs.  If your symptoms worsen or you develop new symptoms please let us know.

## 2014-09-10 NOTE — Progress Notes (Signed)
PATIENT: [name] DOB: [birth year]  REASON FOR VISIT: follow up- seizures, memory disturbance, hx of VP shunt HISTORY FROM: patient  HISTORY OF PRESENT ILLNESS: [name] is a 49 year old male with a history of VP shunt, seizures and memory disturbance. He returns today for follow-up. The patient continues to take Keppra and tolerating it well. He denies any seizure events. The patient did have neuropsychological testing completed. His results concluded that he does have a form of dementia. Dr. Valentina Shaggy reviewed with the patient memory compensatory strategies. Patient does feel that some days his memory is worse than others. Patient's wife states that he will occasionally ask repetitive questions. She states that although iat es he remembers things that she doesn't expect him to remember. He is able to complete all ADLs independent. He does not operate a motor vehicle. The patient is currently in the process of filing for disability. Dr. Monico Hoar report and recommendations support this. The patient denies any new neurological symptoms. He returns today for an evaluation.   HISTORY 05/07/14: [name] is a 49 year old male with a history of VP shunt and seizures and memory disturbance. He returns today for follow-up. The patient is currently taking Keppra and tolerating it well. He denies having any seizure since the last visit. The patient feels that his memory has stayed about the same. However his wife states that he has days where it is worse. The patient continues to have trouble remembering things that he's been told to do. Wife states that he also has a hard time processing information. She states that she will tell him something but the way he processes it is not what she actually said. Patient and his wife state that the memory issues started after his second shunt surgery. At home he is able to complete all ADLs independently. He does not operate a motor vehicle. He is able to cook  meals without difficulty. Patient states that his gait and balance has been an issue since his surgeries. He denies any recent falls. Patient is most concerned today about his memory loss. He returns for an evaluation.  HISTORY 01/07/14: [name] is a 49 year old male with a history of a VP shunt and seizures. He returns today for follow-up. He is currently taking Keppra 1000 mg twice a day. He is tolerating this medication well. Denies any seizures since the last visit. He is able to complete all ADLs independently. He does not operate a motor vehicle. Patient feels that his memory has worsened. He states that he forgets to do things around the house that he is suppose to do such as wash dishes, take the trash out etc.. He will sometimes forget where his wife is and she will be at work. He does feel that he is "confused" more. However he does feel that his Keppra has helped his seizures. Patient continues to have issues with his balance. He recently broke two bones in the left foot but it was not due to a fall or loss of balance. He denies any recent falls. He is currently wearing a foot cast/brace.   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 The first shunt was placed in 2009. The patient has a history of seizure-type events that predated the VP shunt placement. He will have events of shaking on the arms and head that may last several minutes. The last such episode was 18 months ago. The patient is  on Keppra taking 750 mg twice daily, and he is tolerating the medication well. The patient indicates that within the last 6 weeks, he has had an increased frequency of episodes of confusion lasting 2-4 minutes. The wife indicates that he will do unusual things during the episodes, such as placing milk on top of the refrigerator instead in it. The patient had an event recently where he could not remember his name  at the dentist's office. He indicates that he is now having increased frequency of headaches occurring up to 3 times a day, and he takes oxycodone and ibuprofen for this. He has noted some change in balance, and he has not had any falls, but he will occasionally stumble. He has had urgency over the last several months with the bowels and the bladder that is new for him. The patient denies any nausea or vomiting. He returns to this office for an evaluation. The last CT scan of brain was done in April of 2014, and it showed some improvement in ventricular size from a prior study  REVIEW OF SYSTEMS: Out of a complete 14 system review of symptoms, the patient complains only of the following symptoms, and all other reviewed systems are negative.  Ringing in ears, cough, wheezing, shortness of breath, palpitations, daytime sleepiness, snoring, confusion, depression, hallucinations, tremor, seizures, headache, dizziness, memory loss, excessive eating, excessive thirst  ALLERGIES: Allergies  Allergen Reactions  . Omnipaque [Iohexol] Hives  . Prednisone Nausea And Vomiting and Other (See Comments)    unknown    HOME MEDICATIONS: Outpatient Prescriptions Prior to Visit  Medication Sig Dispense Refill  . aspirin 81 MG tablet Take 81 mg by mouth daily.      . Coconut Oil OIL 1 Syringe by Does not apply route daily.    . fludrocortisone (FLORINEF) 0.1 MG tablet Take 0.2 mg by mouth 2 (two) times daily.     . hydrocortisone (CORTEF) 10 MG tablet Take 10 mg by mouth daily.    Marland Kitchen levETIRAcetam (KEPPRA) 1000 MG tablet Take 1 tablet (1,000 mg total) by mouth 2 (two) times daily. 180 tablet 1  . lisinopril (PRINIVIL,ZESTRIL) 20 MG tablet Take 20 mg by mouth daily.    . Melatonin 5 MG TABS Take by mouth at bedtime.    . Misc Natural Products (NF FORMULAS TESTOSTERONE) CAPS Take 3 capsules by mouth daily.    . pramipexole (MIRAPEX) 0.125 MG tablet TAKE ONE TABLET BY MOUTH TWICE DAILY 60 tablet 11  . Sod  Fluoride-Potassium Nitrate 1.1-5 % PSTE Place 1 application onto teeth 2 (two) times daily.    Marland Kitchen venlafaxine XR (EFFEXOR-XR) 75 MG 24 hr capsule TAKE ONE CAPSULE BY MOUTH THREE TIMES DAILY 90 capsule 6   No facility-administered medications prior to visit.    PAST MEDICAL HISTORY: Past Medical History  Diagnosis Date  . Hydrocephalus   . Seizure   . Chiari I malformation   . Atrial fibrillation   . Cardiomyopathy   . Pulmonary embolism   . SIADH (syndrome of inappropriate ADH production)   . Salt-wasting syndrome of infancy   . Coronary artery disease   . Seizures   . Depression   . Anxiety   . Carpal tunnel syndrome   . Headache(784.0)   . Syncope     Recurrent, thought to be neurally mediated  . Renal calculi   . Obstructive sleep apnea   . Restless leg syndrome   . Carpal tunnel syndrome   . Aqueductal stenosis   .  Memory loss     PAST SURGICAL HISTORY: Past Surgical History  Procedure Laterality Date  . Ventriculoperitoneal shunt Bilateral   . Cholecystectomy    . St. jude dm [id number] loop      S/p loop recorder  . Loop recorder explant N/A 10/30/2012    Procedure: LOOP RECORDER EXPLANT;  Surgeon: Deboraha Sprang, MD;  Location: [facility] CATH LAB;  Service: Cardiovascular;  Laterality: N/A;    FAMILY HISTORY: Family History  Problem Relation Age of Onset  . Migraines Mother   . Seizures Neg Hx     SOCIAL HISTORY: Social History   Social History  . Marital Status: Married    Spouse Name: [name]  . Number of Children: 0  . Years of Education: 12   Occupational History  . CUSTOMER SERVICE [employer]    Not working    Social History Main Topics  . Smoking status: Never Smoker   . Smokeless tobacco: Never Used  . Alcohol Use: No  . Drug Use: No  . Sexual Activity: Not on file   Other Topics Concern  . Not on file   Social History Narrative   Patient is married [name]), no children   Patient is right handed   Education level is high school graduate     Caffeine consumption is 1/2 of 2L daily      PHYSICAL EXAM  Filed Vitals:   09/10/14 0841  BP: 154/90  Pulse: 72  Height: 6\' 5"  (1.956 m)  Weight: 344 lb (156.037 kg)   Body mass index is 40.78 kg/(m^2).  Generalized: Well developed, in no acute distress   Neurological examination  Mentation: Alert oriented to time, place, history taking. Follows all commands speech and language fluent. MMSE 28/30 Cranial nerve II-XII: Pupils were equal round reactive to light. Extraocular movements were full, visual field were full on confrontational test. Facial sensation and strength were normal. Uvula tongue midline. Head turning and shoulder shrug  were normal and symmetric. Motor: The motor testing reveals 5 over 5 strength of all 4 extremities. Good symmetric motor tone is noted throughout.  Sensory: Sensory testing is intact to soft touch on all 4 extremities. No evidence of extinction is noted.  Coordination: Cerebellar testing reveals good finger-nose-finger and heel-to-shin bilaterally.  Gait and station: Gait is normal. Tandem gait is normal. Romberg is negative. No drift is seen.  Reflexes: Deep tendon reflexes are symmetric and normal bilaterally.   DIAGNOSTIC DATA (LABS, IMAGING, TESTING) - I reviewed patient records, labs, notes, testing and imaging myself where available.    ASSESSMENT AND PLAN 49 y.o. year old male  has a past medical history of Hydrocephalus; Seizure; Chiari I malformation; Atrial fibrillation; Cardiomyopathy; Pulmonary embolism; SIADH (syndrome of inappropriate ADH production); Salt-wasting syndrome of infancy; Coronary artery disease; Seizures; Depression; Anxiety; Carpal tunnel syndrome; Headache(784.0); Syncope; Renal calculi; Obstructive sleep apnea; Restless leg syndrome; Carpal tunnel syndrome; Aqueductal stenosis; and Memory loss. here with:  1. Memory disorder 2. History of VP shunt 3. Seizures  Overall the patient is doing well. His memory score  has remained stable. Today his MMSE is 28/30 was previously 29/30. I will start the patient on Namenda. He will continue on Keppra for seizures. I have advised the patient that taking Namenda and Mirapex can sometimes increase the level of both drugs. They plan to speak to the pharmacist regarding this. Patient advised that if he has any seizure events he should let us know. If the patient's symptoms worsen or he develops  new symptoms he should also let us know. He will follow-up in 6 months with Dr. [name].  I spent 25 minutes with the patient 50% of this time was spent counseling the patient on his memory disorder and medication management.    Ward Givens, MSN, NP-C 09/10/2014, 8:44 AM [facility] [address] [place], [place] [address] [phone]  Note: This document was prepared with digital dictation and possible smart phrase technology. Any transcriptional errors that result from this process are unintentional.

## 2014-09-10 NOTE — Progress Notes (Signed)
I have read the note, and I agree with the clinical assessment and plan.  Lavanna Rog KEITH   

## 2014-09-17 ENCOUNTER — Telehealth: Payer: Self-pay | Admitting: Adult Health

## 2014-09-17 NOTE — Telephone Encounter (Signed)
I called the pharmacy.  Spoke with Raven.  She said the co-pay is $61 for the titration pack.  She transferred me to the pharmacist.  They will try to enter the Rx for the tablets with titration instructions to see if the co-pay will be lower.  The total dose remains the same.  She asked if we can call back after 1:00 to give them a chance to enter this info and process claim.  At that time, they should be able to give [place] the co-pay amount.

## 2014-09-17 NOTE — Telephone Encounter (Signed)
I called back.  Spoke with [name].  She said the Rx for tabs went through for $4 co-pay.  I called [name] back.  Got no answer.  Left message explaining the pharmacy was able to dispense the tablets instead of packet with same instructions, and the cost is $4.  Asked that they call us back with questions or if anything further is needed.

## 2014-09-17 NOTE — Telephone Encounter (Signed)
Patient's wife is calling. The medication memantine (NAMENDA TITRATION PAK) tablet pack is too expensive for the patient. Is there another medication that can be called to [facility] in [place]? Thank you.

## 2014-11-05 ENCOUNTER — Other Ambulatory Visit: Payer: Self-pay

## 2014-11-05 MED ORDER — MEMANTINE HCL 10 MG PO TABS: 10.0000 mg | ORAL_TABLET | Freq: Two times a day (BID) | ORAL | Status: DC

## 2015-01-11 ENCOUNTER — Other Ambulatory Visit: Payer: Self-pay | Admitting: Neurology

## 2015-02-24 ENCOUNTER — Ambulatory Visit (INDEPENDENT_AMBULATORY_CARE_PROVIDER_SITE_OTHER): Payer: BLUE CROSS/BLUE SHIELD | Admitting: Neurology

## 2015-02-24 ENCOUNTER — Encounter: Payer: Self-pay | Admitting: Neurology

## 2015-02-24 VITALS — BP 152/94 | HR 77 | Ht 77.0 in | Wt 357.5 lb

## 2015-02-24 DIAGNOSIS — R413 Other amnesia: Secondary | ICD-10-CM

## 2015-02-24 DIAGNOSIS — R569 Unspecified convulsions: Secondary | ICD-10-CM

## 2015-02-24 DIAGNOSIS — Q039 Congenital hydrocephalus, unspecified: Secondary | ICD-10-CM | POA: Diagnosis not present

## 2015-02-24 DIAGNOSIS — R269 Unspecified abnormalities of gait and mobility: Secondary | ICD-10-CM | POA: Diagnosis not present

## 2015-02-24 MED ORDER — MEMANTINE HCL 10 MG PO TABS: 10.0000 mg | ORAL_TABLET | Freq: Two times a day (BID) | ORAL | Status: DC

## 2015-02-24 MED ORDER — LEVETIRACETAM 1000 MG PO TABS: 1000.0000 mg | ORAL_TABLET | Freq: Two times a day (BID) | ORAL | Status: DC

## 2015-02-24 NOTE — Patient Instructions (Signed)

## 2015-02-24 NOTE — Progress Notes (Signed)
Reason for visit: Seizures  [name] is an 50 y.o. 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 Joya Salm. The patient has had some recent changes in memory, but he has also had a change in balance and bladder and bowel control. He is on Keppra for the seizures, he recently was placed on Namenda which she believes has been somewhat helpful for his memory. He will have good days and bad days with the memory. He has not had any seizures in greater than 2 years. He does not operate a motor vehicle. He denies any headaches, nausea, or vomiting. He denies any focal numbness or weakness of the face, arms, or legs. He has not had a CT evaluation of the brain since July 2015. He has not seen Dr. Joya Salm in several years. He returns for an evaluation.  Past Medical History  Diagnosis Date  . Hydrocephalus   . Seizure ([place])   . Chiari I malformation ([place])   . Atrial fibrillation ([place])   . Cardiomyopathy   . Pulmonary embolism ([place])   . SIADH (syndrome of inappropriate ADH production) ([place])   . Salt-wasting syndrome of infancy   . Coronary artery disease   . Seizures ([place])   . Depression   . Anxiety   . Carpal tunnel syndrome   . Headache(784.0)   . Syncope     Recurrent, thought to be neurally mediated  . Renal calculi   . Obstructive sleep apnea   . Restless leg syndrome   . Carpal tunnel syndrome   . Aqueductal stenosis ([place])   . Memory loss     Past Surgical History  Procedure Laterality Date  . Ventriculoperitoneal shunt Bilateral   . Cholecystectomy    . St. jude dm [id number] loop      S/p loop recorder  . Loop recorder explant N/A 10/30/2012    Procedure: LOOP RECORDER EXPLANT;  Surgeon: Deboraha Sprang, MD;  Location: [facility] CATH LAB;  Service: Cardiovascular;  Laterality: N/A;    Family History  Problem Relation Age of Onset    . Migraines Mother   . Seizures Neg Hx     Social history:  reports that he has never smoked. He has never used smokeless tobacco. He reports that he does not drink alcohol or use illicit drugs.    Allergies  Allergen Reactions  . Omnipaque [Iohexol] Hives  . Prednisone Nausea And Vomiting and Other (See Comments)    unknown    Medications:  Prior to Admission medications   Medication Sig Start Date End Date Taking? Authorizing Provider  aspirin 81 MG tablet Take 81 mg by mouth daily.     Yes Historical Provider, MD  Coconut Oil OIL 1 Syringe by Does not apply route daily.   Yes Historical Provider, MD  fludrocortisone (FLORINEF) 0.1 MG tablet Take 0.2 mg by mouth 2 (two) times daily.    Yes Historical Provider, MD  hydrocortisone (CORTEF) 10 MG tablet Take 10 mg by mouth daily.   Yes Historical Provider, MD  levETIRAcetam (KEPPRA) 1000 MG tablet Take 1 tablet (1,000 mg total) by mouth 2 (two) times daily. 07/24/14  Yes Kathrynn Ducking, MD  lisinopril (PRINIVIL,ZESTRIL) 20 MG tablet Take 20 mg by mouth daily.   Yes Historical Provider, MD  Melatonin 5 MG TABS Take by mouth at bedtime.   Yes Historical  Provider, MD  memantine (NAMENDA) 10 MG tablet Take 1 tablet (10 mg total) by mouth 2 (two) times daily. 11/05/14  Yes Ward Givens, NP  pramipexole (MIRAPEX) 0.125 MG tablet TAKE ONE TABLET BY MOUTH TWICE DAILY 05/14/14  Yes Kathrynn Ducking, MD  Sod Fluoride-Potassium Nitrate 1.1-5 % PSTE Place 1 application onto teeth 2 (two) times daily.   Yes Historical Provider, MD  venlafaxine XR (EFFEXOR-XR) 75 MG 24 hr capsule TAKE ONE CAPSULE BY MOUTH THREE TIMES DAILY 01/11/15  Yes Kathrynn Ducking, MD    ROS:  Out of a complete 14 system review of symptoms, the patient complains only of the following symptoms, and all other reviewed systems are negative.  Weight gain Excessive thirst Incontinence of bowels Daytime sleepiness, snoring, sleep talking Frequency of urination Walking  difficulty Memory loss, tremors Agitation, confusion, decreased concentration, depression, anxiety, hallucinations  Blood pressure 152/94, pulse 77, height 6\' 5"  (1.956 m), weight 357 lb 8 oz (162.161 kg).  Physical Exam  General: The patient is alert and cooperative at the time of the examination. The patient is moderately to markedly obese.  Skin: No significant peripheral edema is noted.   Neurologic Exam  Mental status: The patient is alert and oriented x 3 at the time of the examination. The patient has apparent normal recent and remote memory, with an apparently normal attention span and concentration ability. Mini-Mental Status Examination done today shows a total score of 30/30. The patient is able to name 17 animals in 30 seconds.   Cranial nerves: Facial symmetry is present. Speech is normal, no aphasia or dysarthria is noted. Extraocular movements are full. Visual fields are full.  Motor: The patient has good strength in all 4 extremities.  Sensory examination: Soft touch sensation is symmetric on the face, arms, and legs.  Coordination: The patient has good finger-nose-finger and heel-to-shin bilaterally.  Gait and station: The patient has a normal gait. Tandem gait is normal. Romberg is negative. No drift is seen.  Reflexes: Deep tendon reflexes are symmetric.   Assessment/Plan:  1. Aqueductal stenosis, hydrocephalus  2. Seizures  3. Memory disturbance  4. Gait disturbance  5. Excessive daytime drowsiness  The patient is doing well with his seizures, he is having some problems with memory, but he also has noted some mild changes in balance and bowel and bladder control issues. We will recheck a CT scan of the brain. The patient does report excessive daytime drowsiness that may be related to medication such as Keppra, but we may consider repeating a sleep study. He indicates that he did have a sleep study years ago and it did not show sleep apnea. He will  follow-up in 8 months.  Jill Alexanders MD 02/24/2015 5:46 PM  [facility] [address] [place], [place] [address]  Phone [phone] Fax [phone]

## 2015-03-05 ENCOUNTER — Ambulatory Visit
Admission: RE | Admit: 2015-03-05 | Discharge: 2015-03-05 | Disposition: A | Discharge status: Home / Self Care | Payer: BLUE CROSS/BLUE SHIELD | Source: Ambulatory Visit | Attending: Neurology | Admitting: Neurology

## 2015-03-05 DIAGNOSIS — Q039 Congenital hydrocephalus, unspecified: Secondary | ICD-10-CM

## 2015-03-05 DIAGNOSIS — R269 Unspecified abnormalities of gait and mobility: Secondary | ICD-10-CM

## 2015-03-05 DIAGNOSIS — R569 Unspecified convulsions: Secondary | ICD-10-CM

## 2015-03-05 DIAGNOSIS — R413 Other amnesia: Secondary | ICD-10-CM | POA: Diagnosis not present

## 2015-03-07 ENCOUNTER — Telehealth: Payer: Self-pay | Admitting: Neurology

## 2015-03-07 NOTE — Telephone Encounter (Signed)
I called concerning the results. The ventricles look better, there is some SVD that could affect waling and balance as well.   CT head 03/05/15:  IMPRESSION: This CT scan of the head without contrast shows the following: 1. Bilateral ventriculostomy tubes positioned into the frontal horns of both lateral ventricles. When compared to the 04/30/2015 CT scan, there has been further reduction in the size of the third and lateral ventricles. 2. Extensive chronic microvascular ischemic changes, unchanged when compared to the previous CT scan. 3. Small chronic subdural collections, also unchanged.

## 2015-06-03 ENCOUNTER — Other Ambulatory Visit: Payer: Self-pay | Admitting: Neurology

## 2015-06-03 DIAGNOSIS — F331 Major depressive disorder, recurrent, moderate: Secondary | ICD-10-CM | POA: Diagnosis not present

## 2015-06-03 DIAGNOSIS — I10 Essential (primary) hypertension: Secondary | ICD-10-CM | POA: Diagnosis not present

## 2015-07-22 DIAGNOSIS — E871 Hypo-osmolality and hyponatremia: Secondary | ICD-10-CM | POA: Insufficient documentation

## 2015-07-22 DIAGNOSIS — E291 Testicular hypofunction: Secondary | ICD-10-CM | POA: Diagnosis not present

## 2015-07-22 DIAGNOSIS — E2740 Unspecified adrenocortical insufficiency: Secondary | ICD-10-CM | POA: Diagnosis not present

## 2015-07-22 DIAGNOSIS — R7303 Prediabetes: Secondary | ICD-10-CM | POA: Insufficient documentation

## 2015-07-22 DIAGNOSIS — E119 Type 2 diabetes mellitus without complications: Secondary | ICD-10-CM | POA: Insufficient documentation

## 2015-09-17 ENCOUNTER — Other Ambulatory Visit: Payer: Self-pay | Admitting: Neurology

## 2015-09-23 ENCOUNTER — Other Ambulatory Visit: Payer: Self-pay | Admitting: Neurology

## 2015-09-24 ENCOUNTER — Other Ambulatory Visit: Payer: Self-pay | Admitting: Neurology

## 2015-10-29 ENCOUNTER — Ambulatory Visit: Payer: BLUE CROSS/BLUE SHIELD | Admitting: Neurology

## 2015-10-29 ENCOUNTER — Telehealth: Payer: Self-pay | Admitting: Neurology

## 2015-10-29 NOTE — Telephone Encounter (Signed)
The patient did not show for a revisit appointment today.

## 2015-11-01 ENCOUNTER — Encounter: Payer: Self-pay | Admitting: Neurology

## 2015-12-01 DIAGNOSIS — F331 Major depressive disorder, recurrent, moderate: Secondary | ICD-10-CM | POA: Diagnosis not present

## 2015-12-01 DIAGNOSIS — I10 Essential (primary) hypertension: Secondary | ICD-10-CM | POA: Diagnosis not present

## 2016-01-05 ENCOUNTER — Ambulatory Visit (INDEPENDENT_AMBULATORY_CARE_PROVIDER_SITE_OTHER): Payer: BLUE CROSS/BLUE SHIELD | Admitting: Neurology

## 2016-01-05 ENCOUNTER — Encounter: Payer: Self-pay | Admitting: Neurology

## 2016-01-05 VITALS — BP 145/91 | HR 69 | Ht 77.0 in | Wt 355.5 lb

## 2016-01-05 DIAGNOSIS — R413 Other amnesia: Secondary | ICD-10-CM

## 2016-01-05 DIAGNOSIS — R269 Unspecified abnormalities of gait and mobility: Secondary | ICD-10-CM

## 2016-01-05 DIAGNOSIS — R569 Unspecified convulsions: Secondary | ICD-10-CM

## 2016-01-05 MED ORDER — MIRABEGRON ER 25 MG PO TB24: 25.0000 mg | ORAL_TABLET | Freq: Every day | ORAL | 3 refills | Status: DC

## 2016-01-05 NOTE — Progress Notes (Signed)
Reason for visit: Memory disturbance  [name] is an 50 y.o. male  History of present illness:  [name] is a 50 year old right-handed white male with a history of aqueduct stenosis. The patient has had VP shunt placed bilaterally. The patient claims he cannot have MRI evaluation of the brain secondary to the VP shunts that are programmable. The patient has had some progression of memory over time, he also reports some urinary urgency and incontinence but over the last 2 or 3 months, but he has also had some fecal incontinence as well. He denies any neck or low back pain or pain down arms or legs. He does have a mild gait disturbance, he has not fallen recently. CT scan of the brain done in February 2017 showed significant improvement in ventricular sizes compared to the scan in 2014. There is some white matter changes and some atrophy seen. The patient does report daytime drowsiness, he is also on Keppra for seizures without any recent recurrence. He claims that he has had a sleep study done several years ago that was unremarkable. He returns for an evaluation.  Past Medical History:  Diagnosis Date  . Anxiety   . Aqueductal stenosis ([name])   . Atrial fibrillation ([name])   . Cardiomyopathy   . Carpal tunnel syndrome   . Carpal tunnel syndrome   . Chiari I malformation ([name])   . Coronary artery disease   . Depression   . Headache(784.0)   . Hydrocephalus   . Memory loss   . Obstructive sleep apnea   . Pulmonary embolism ([place])   . Renal calculi   . Restless leg syndrome   . Salt-wasting syndrome of infancy   . Seizure ([name])   . Seizures ([name])   . 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 Syncope    Recurrent, thought to be neurally mediated    Past Surgical History:  Procedure Laterality Date  . CHOLECYSTECTOMY    . LOOP RECORDER EXPLANT N/A 10/30/2012   Procedure: LOOP RECORDER EXPLANT;  Surgeon: Deboraha Sprang, MD;  Location: [facility] CATH LAB;   Service: Cardiovascular;  Laterality: N/A;  . St. Jude DM 2112100 loop     S/p loop recorder  . VENTRICULOPERITONEAL SHUNT Bilateral     Family History  Problem Relation Age of Onset  . Migraines Mother   . Seizures Neg Hx     Social history:  reports that he has never smoked. He has never used smokeless tobacco. He reports that he does not drink alcohol or use drugs.    Allergies  Allergen Reactions  . Omnipaque [Iohexol] Hives  . Prednisone Nausea And Vomiting and Other (See Comments)    unknown    Medications:  Prior to Admission medications   Medication Sig Start Date End Date Taking? Authorizing Provider  aspirin 81 MG tablet Take 81 mg by mouth daily.     Yes Historical Provider, MD  Coconut Oil OIL 1 Syringe by Does not apply route daily.   Yes Historical Provider, MD  fludrocortisone (FLORINEF) 0.1 MG tablet Take 0.2 mg by mouth 2 (two) times daily.    Yes Historical Provider, MD  hydrocortisone (CORTEF) 10 MG tablet Take 10 mg by mouth daily.   Yes Historical Provider, MD  levETIRAcetam (KEPPRA) 1000 MG tablet TAKE ONE TABLET BY MOUTH TWICE DAILY 09/24/15  Yes Kathrynn Ducking, MD  lisinopril (PRINIVIL,ZESTRIL) 20 MG tablet Take 20 mg by mouth daily.   Yes  Historical Provider, MD  Melatonin 5 MG TABS Take by mouth at bedtime.   Yes Historical Provider, MD  memantine (NAMENDA) 10 MG tablet Take 1 tablet (10 mg total) by mouth 2 (two) times daily. 02/24/15  Yes Kathrynn Ducking, MD  pramipexole (MIRAPEX) 0.125 MG tablet TAKE ONE TABLET BY MOUTH TWICE DAILY 06/04/15  Yes Kathrynn Ducking, MD  Sod Fluoride-Potassium Nitrate 1.1-5 % PSTE Place 1 application onto teeth 2 (two) times daily.   Yes Historical Provider, MD  venlafaxine XR (EFFEXOR-XR) 75 MG 24 hr capsule TAKE ONE CAPSULE BY MOUTH THREE TIMES DAILY 09/20/15  Yes Kathrynn Ducking, MD    ROS:  Out of a complete 14 system review of symptoms, the patient complains only of the following symptoms, and all other reviewed  systems are negative.  Shortness of breath Heat intolerance, excessive thirst, excessive eating Restless legs, sleep apnea, snoring, sleep talking Memory loss, headache Agitation, confusion, depression  Blood pressure (!) 145/91, pulse 69, height 6\' 5"  (1.956 m), weight (!) 355 lb 8 oz (161.3 kg).  Physical Exam  General: The patient is alert and cooperative at the time of the examination. The patient is moderately obese.  Skin: No significant peripheral edema is noted.   Neurologic Exam  Mental status: The patient is alert and oriented x 3 at the time of the examination. The patient has apparent normal recent and remote memory, with an apparently normal attention span and concentration ability. Mini-Mental Status Examination done today shows a total score 28/30.   Cranial nerves: Facial symmetry is present. Speech is normal, no aphasia or dysarthria is noted. Extraocular movements are full. Visual fields are full.  Motor: The patient has good strength in all 4 extremities.  Sensory examination: Soft touch sensation is symmetric on the face, arms, and legs.  Coordination: The patient has good finger-nose-finger and heel-to-shin bilaterally.  Gait and station: The patient has a normal gait. Tandem gait is unsteady. Romberg is negative. No drift is seen.  Reflexes: Deep tendon reflexes are symmetric.   CT head 03/07/15:  IMPRESSION:  This CT scan of the head without contrast shows the following: 1.   Bilateral ventriculostomy tubes positioned into the frontal horns of both lateral ventricles.   When compared to the 04/29/2012 CT scan, there has been further reduction in the size of the third and lateral ventricles. 2.   Extensive chronic microvascular ischemic changes, unchanged when compared to the previous CT scan. 3.   Small chronic subdural collections, also unchanged.  * CT scan images were reviewed online. I agree with the written report.    Assessment/Plan:  1.  Aqueductal stenosis, bilateral VP shunting  2. Gait disturbance  3. Incontinence of bowel and bladder  The patient will be set up for blood work today to evaluate the memory issues. He is on Namenda and will remain on this. Given history of seizures, Aricept should be avoided. The patient will be given a trial on Myrbetriq for the bladder. He will follow-up in 6 months, sooner if needed. We may consider a urology referral in the future.  Jill Alexanders MD 01/05/2016 4:16 PM  [facility] [address] [name], [place] [address]  Phone [phone] Fax [phone]

## 2016-01-07 ENCOUNTER — Telehealth: Payer: Self-pay

## 2016-01-07 LAB — SEDIMENTATION RATE: Sed Rate: 31 mm/hr — ABNORMAL HIGH (ref 0–30)

## 2016-01-07 LAB — VITAMIN B12: VITAMIN B 12: 640 pg/mL (ref 232–1245)

## 2016-01-07 LAB — RPR: RPR: NONREACTIVE

## 2016-01-07 LAB — B. BURGDORFI ANTIBODIES: Lyme IgG/IgM Ab: <0.91 {ISR} (ref 0.00–0.90)

## 2016-01-07 LAB — COPPER, SERUM: Copper: 92 ug/dL (ref 72–166)

## 2016-01-07 LAB — TSH: TSH: 2.44 u[IU]/mL (ref 0.450–4.500)

## 2016-01-07 LAB — HIV ANTIBODY (ROUTINE TESTING W REFLEX): HIV Screen 4th Generation wRfx: NONREACTIVE

## 2016-01-07 NOTE — Telephone Encounter (Signed)
-----  Message from Kathrynn Ducking, MD sent at 01/07/2016  7:33 AM EST -----   The blood work results are unremarkable, with exception of a minimally elevated ESR, not clinically significant. Please call the patient. ----- Message ----- From: Lavone Neri Lab Results In Sent: 01/06/2016   7:43 AM To: Kathrynn Ducking, MD

## 2016-01-11 NOTE — Telephone Encounter (Signed)
Attempted to call pt again w/ lab results. However, mailbox full and unable to leave mssg.

## 2016-01-11 NOTE — Telephone Encounter (Addendum)
Called pt w/ lab results. May call back w/ additional questions/concerns. 

## 2016-02-03 DIAGNOSIS — M25511 Pain in right shoulder: Secondary | ICD-10-CM | POA: Diagnosis not present

## 2016-02-24 DIAGNOSIS — M25511 Pain in right shoulder: Secondary | ICD-10-CM | POA: Diagnosis not present

## 2016-04-01 ENCOUNTER — Other Ambulatory Visit: Payer: Self-pay | Admitting: Neurology

## 2016-06-06 ENCOUNTER — Other Ambulatory Visit: Payer: Self-pay | Admitting: Neurology

## 2016-07-05 ENCOUNTER — Ambulatory Visit: Payer: PPO | Admitting: Neurology

## 2016-07-05 ENCOUNTER — Encounter: Payer: Self-pay | Admitting: Neurology

## 2016-07-05 ENCOUNTER — Ambulatory Visit (INDEPENDENT_AMBULATORY_CARE_PROVIDER_SITE_OTHER): Payer: PPO | Admitting: Neurology

## 2016-07-05 VITALS — BP 134/70 | HR 70 | Ht 77.0 in | Wt 355.5 lb

## 2016-07-05 DIAGNOSIS — R413 Other amnesia: Secondary | ICD-10-CM

## 2016-07-05 DIAGNOSIS — R569 Unspecified convulsions: Secondary | ICD-10-CM

## 2016-07-05 DIAGNOSIS — R269 Unspecified abnormalities of gait and mobility: Secondary | ICD-10-CM | POA: Diagnosis not present

## 2016-07-05 MED ORDER — MEMANTINE HCL 10 MG PO TABS: 10.0000 mg | ORAL_TABLET | Freq: Two times a day (BID) | ORAL | 3 refills | Status: DC

## 2016-07-05 MED ORDER — MIRABEGRON ER 25 MG PO TB24: 25.0000 mg | ORAL_TABLET | Freq: Every day | ORAL | 3 refills | Status: DC

## 2016-07-05 NOTE — Progress Notes (Signed)
Reason for visit: Aqueduct stenosis  [name] is an 51 y.o. male  History of present illness:  [name] is a 51 year old right-handed white male with a history of aqueductal stenosis and hydrocephalus, status post VP shunting procedures. The patient has stabilized with his walking, his walking is still not back to normal. The patient has started working out in a gym in is trying to lose weight. The patient has not had any falls. He is on Keppra, he has not had any seizures. He takes medication for restless leg syndrome. The patient is on Myrbetriq for some problems with urinary urgency and incontinence. The patient overall is stable with his clinical condition. He reports no new medical issues that have come up since last seen. He does have a mild memory problem, he is on Namenda for this. He does not believe that he has had any significant change in memory since last seen.  Past Medical History:  Diagnosis Date  . Anxiety   . Aqueductal stenosis ([place])   . Atrial fibrillation ([place])   . Cardiomyopathy   . Carpal tunnel syndrome   . Carpal tunnel syndrome   . Chiari I malformation ([place])   . Coronary artery disease   . Depression   . Headache(784.0)   . Hydrocephalus   . Memory loss   . Obstructive sleep apnea   . Pulmonary embolism ([place])   . Renal calculi   . Restless leg syndrome   . Salt-wasting syndrome of infancy   . Seizure ([place])   . Seizures ([place])   . SIADH (syndrome of inappropriate ADH production) ([place])   . Syncope    Recurrent, thought to be neurally mediated    Past Surgical History:  Procedure Laterality Date  . CHOLECYSTECTOMY    . LOOP RECORDER EXPLANT N/A 10/30/2012   Procedure: LOOP RECORDER EXPLANT;  Surgeon: Deboraha Sprang, MD;  Location[facility] LAB;  Service: Cardiovascular;  Laterality: N/A;  . St. Jude DM 2112100 loop     S/p loop recorder  . VENTRICULOPERITONEAL SHUNT Bilateral     Family History  Problem Relation Age of Onset  .  Migraines Mother   . Seizures Neg Hx     Social history:  reports that he has never smoked. He has never used smokeless tobacco. He reports that he does not drink alcohol or use drugs.    Allergies  Allergen Reactions  . Omnipaque [Iohexol] Hives  . Prednisone Nausea And Vomiting and Other (See Comments)    unknown    Medications:  Prior to Admission medications   Medication Sig Start Date End Date Taking? Authorizing Provider  aspirin 81 MG tablet Take 81 mg by mouth daily.     Yes [provider]  fludrocortisone (FLORINEF) 0.1 MG tablet Take 0.2 mg by mouth 2 (two) times daily.    Yes [provider]  hydrocortisone (CORTEF) 10 MG tablet Take 10 mg by mouth daily.   Yes [provider]  levETIRAcetam (KEPPRA) 1000 MG tablet TAKE ONE TABLET BY MOUTH TWICE DAILY 04/03/16  Yes Kathrynn Ducking, MD  lisinopril (PRINIVIL,ZESTRIL) 20 MG tablet Take 20 mg by mouth daily.   Yes [provider]  Melatonin 5 MG TABS Take by mouth at bedtime.   Yes [provider]  memantine (NAMENDA) 10 MG tablet Take 1 tablet (10 mg total) by mouth 2 (two) times daily. 02/24/15  Yes Kathrynn Ducking, MD  mirabegron ER (MYRBETRIQ) 25 MG [phone]  tablet Take 1 tablet (25 mg total) by mouth daily. 01/05/16  Yes Kathrynn Ducking, MD  pramipexole (MIRAPEX) 0.125 MG tablet TAKE ONE TABLET BY MOUTH TWICE DAILY 06/06/16  Yes Kathrynn Ducking, MD  Sod Fluoride-Potassium Nitrate 1.1-5 % PSTE Place 1 application onto teeth 2 (two) times daily.   Yes [provider]  venlafaxine XR (EFFEXOR-XR) 75 MG 24 hr capsule TAKE ONE CAPSULE BY MOUTH THREE TIMES DAILY 09/20/15  Yes Kathrynn Ducking, MD    ROS:  Out of a complete 14 system review of symptoms, the patient complains only of the following symptoms, and all other reviewed systems are negative.  Weight gain Excessive thirst, excessive eating Diarrhea Memory loss Agitation, confusion, anxiety,  hallucinations  Blood pressure 134/70, pulse 70, height 6\' 5"  (1.956 m), weight (!) 355 lb 8 oz (161.3 kg).  Physical Exam  General: The patient is alert and cooperative at the time of the examination. The patient is markedly obese.  Skin: No significant peripheral edema is noted.   Neurologic Exam  Mental status: The patient is alert and oriented x 3 at the time of the examination. The patient has apparent normal recent and remote memory, with an apparently normal attention span and concentration ability. Mini-Mental Status Examination done today shows total score 28/30.   Cranial nerves: Facial symmetry is present. Speech is normal, no aphasia or dysarthria is noted. Extraocular movements are full. Visual fields are full.  Motor: The patient has good strength in all 4 extremities.  Sensory examination: Soft touch sensation is symmetric on the face, arms, and legs.  Coordination: The patient has good finger-nose-finger and heel-to-shin bilaterally.  Gait and station: The patient has a normal gait. Tandem gait is unsteady. Romberg is negative. No drift is seen.  Reflexes: Deep tendon reflexes are symmetric.   Assessment/Plan:  1. Aqueductal stenosis, status post VP shunting  2. Mild memory disturbance  3. Gait disturbance  4. Restless leg syndrome  5. Neurogenic bladder  6. History of seizures  The patient is doing fairly well with his medical issues. A prescription was sent for the [facility] and for the Namenda. The patient will follow-up in 6 months, sooner if needed. He will call if any questions arise.   Jill Alexanders MD 07/05/2016 4:03 PM  [facility] [address] [place], [place] [address]  Phone [phone] Fax [phone]

## 2016-07-12 ENCOUNTER — Ambulatory Visit: Payer: PPO | Admitting: Neurology

## 2016-09-21 ENCOUNTER — Other Ambulatory Visit: Payer: Self-pay | Admitting: Neurology

## 2016-10-11 DIAGNOSIS — Z23 Encounter for immunization: Secondary | ICD-10-CM | POA: Diagnosis not present

## 2016-10-18 DIAGNOSIS — F331 Major depressive disorder, recurrent, moderate: Secondary | ICD-10-CM | POA: Diagnosis not present

## 2016-10-18 DIAGNOSIS — I10 Essential (primary) hypertension: Secondary | ICD-10-CM | POA: Diagnosis not present

## 2016-10-18 DIAGNOSIS — G918 Other hydrocephalus: Secondary | ICD-10-CM | POA: Diagnosis not present

## 2016-10-18 DIAGNOSIS — I480 Paroxysmal atrial fibrillation: Secondary | ICD-10-CM | POA: Diagnosis not present

## 2016-10-18 DIAGNOSIS — E222 Syndrome of inappropriate secretion of antidiuretic hormone: Secondary | ICD-10-CM | POA: Diagnosis not present

## 2016-10-23 ENCOUNTER — Telehealth: Payer: Self-pay | Admitting: Neurology

## 2016-10-23 NOTE — Telephone Encounter (Signed)
Patient's wife calling stating the patient needs to be seen. He has had diarrhea since 10am today and has been throwing up since 1pm today. She is very concerned because he has 2 shunts.

## 2016-10-23 NOTE — Telephone Encounter (Signed)
Spoke to patient's wife - she is contacting his PCP.  Vomiting has resolved - diarrhea is still an issue today.  No other symptoms present.  He has no fever/chills or other signs of infection.

## 2016-10-23 NOTE — Telephone Encounter (Signed)
I called and talked to the wife. They are concerned about a shunt malfunction, but the patient had significant diarrhea for several hours then had some nausea and vomiting. I suspect this represents a viral gastroenteritis, not a shunt malfunction.  Shunt malfunction may present with headache and altered mental status and then nausea and vomiting may occur, diarrhea is usually not part of this symptom complex.

## 2016-10-25 DIAGNOSIS — K921 Melena: Secondary | ICD-10-CM | POA: Diagnosis not present

## 2016-10-25 DIAGNOSIS — K222 Esophageal obstruction: Secondary | ICD-10-CM | POA: Diagnosis not present

## 2016-11-02 DIAGNOSIS — I10 Essential (primary) hypertension: Secondary | ICD-10-CM | POA: Diagnosis not present

## 2016-11-09 DIAGNOSIS — R42 Dizziness and giddiness: Secondary | ICD-10-CM | POA: Diagnosis not present

## 2016-11-10 ENCOUNTER — Other Ambulatory Visit: Payer: Self-pay | Admitting: Neurology

## 2016-11-16 DIAGNOSIS — D126 Benign neoplasm of colon, unspecified: Secondary | ICD-10-CM | POA: Diagnosis not present

## 2016-11-16 DIAGNOSIS — Z982 Presence of cerebrospinal fluid drainage device: Secondary | ICD-10-CM | POA: Diagnosis not present

## 2016-11-16 DIAGNOSIS — K5730 Diverticulosis of large intestine without perforation or abscess without bleeding: Secondary | ICD-10-CM | POA: Diagnosis not present

## 2016-11-16 DIAGNOSIS — Z86010 Personal history of colonic polyps: Secondary | ICD-10-CM | POA: Diagnosis not present

## 2016-11-16 DIAGNOSIS — Z9049 Acquired absence of other specified parts of digestive tract: Secondary | ICD-10-CM | POA: Diagnosis not present

## 2016-11-16 DIAGNOSIS — Z800 Family history of malignant neoplasm of digestive organs: Secondary | ICD-10-CM | POA: Diagnosis not present

## 2016-11-16 DIAGNOSIS — I10 Essential (primary) hypertension: Secondary | ICD-10-CM | POA: Diagnosis not present

## 2016-11-16 DIAGNOSIS — K648 Other hemorrhoids: Secondary | ICD-10-CM | POA: Diagnosis not present

## 2016-11-16 DIAGNOSIS — G919 Hydrocephalus, unspecified: Secondary | ICD-10-CM | POA: Diagnosis not present

## 2016-11-16 DIAGNOSIS — Z79899 Other long term (current) drug therapy: Secondary | ICD-10-CM | POA: Diagnosis not present

## 2016-11-16 DIAGNOSIS — D122 Benign neoplasm of ascending colon: Secondary | ICD-10-CM | POA: Diagnosis not present

## 2016-11-16 DIAGNOSIS — Z7982 Long term (current) use of aspirin: Secondary | ICD-10-CM | POA: Diagnosis not present

## 2016-11-16 DIAGNOSIS — Z1211 Encounter for screening for malignant neoplasm of colon: Secondary | ICD-10-CM | POA: Diagnosis not present

## 2016-11-16 LAB — HM COLONOSCOPY

## 2017-01-10 ENCOUNTER — Ambulatory Visit (INDEPENDENT_AMBULATORY_CARE_PROVIDER_SITE_OTHER): Payer: PPO | Admitting: Adult Health

## 2017-01-10 ENCOUNTER — Encounter: Payer: Self-pay | Admitting: Adult Health

## 2017-01-10 VITALS — BP 144/90 | HR 65 | Ht 77.0 in | Wt 351.8 lb

## 2017-01-10 DIAGNOSIS — R413 Other amnesia: Secondary | ICD-10-CM | POA: Diagnosis not present

## 2017-01-10 DIAGNOSIS — R569 Unspecified convulsions: Secondary | ICD-10-CM | POA: Diagnosis not present

## 2017-01-10 NOTE — Patient Instructions (Signed)
Your Plan:  Continue Keppra 1000 mg twice a day Continue Namenda- memory score is stable If your symptoms worsen or you develop new symptoms please let us know.   Thank you for coming to see [place] at [facility]. I hope we have been able to provide you high quality care today.  You may receive a patient satisfaction survey over the next few weeks. We would appreciate your feedback and comments so that we may continue to improve ourselves and the health of our patients.

## 2017-01-10 NOTE — Progress Notes (Signed)
I have read the note, and I agree with the clinical assessment and plan.  Lonette Stevison K Shelsie Tijerino   

## 2017-01-10 NOTE — Progress Notes (Signed)
PATIENT: [name] DOB: 09-19-1965  REASON FOR VISIT: follow up HISTORY FROM: patient  HISTORY OF PRESENT ILLNESS: Today 01/10/17 [name]-year-old male with a history of memory disturbance.  He returns today for follow-up.  Overall patient states that he has been doing well.  He denies any seizure events.  He continues on Keppra thousand milligrams twice a day.  He does state that he had an episode 3 days ago where he was piling up clothes.  He states that he temporarily felt as if he cannot move his arms.  He states that this only lasted for several seconds.  He states that tonight before he did not sleep well.  He reports that he has had similar event several months ago.  He is able to complete all ADLs independently.  He no longer operates a Teacher, music.  He returns today for evaluation.  HISTORY  [name] is a 51 year old right-handed white male with a history of aqueductal stenosis and hydrocephalus, status post VP shunting procedures. The patient has stabilized with his walking, his walking is still not back to normal. The patient has started working out in a gym in is trying to lose weight. The patient has not had any falls. He is on Keppra, he has not had any seizures. He takes medication for restless leg syndrome. The patient is on Myrbetriq for some problems with urinary urgency and incontinence. The patient overall is stable with his clinical condition. He reports no new medical issues that have come up since last seen. He does have a mild memory problem, he is on Namenda for this. He does not believe that he has had any significant change in memory since last seen.  REVIEW OF SYSTEMS: Out of a complete 14 system review of symptoms, the patient complains only of the following symptoms, and all other reviewed systems are negative.  See HPI  ALLERGIES: Allergies  Allergen Reactions  . Omnipaque [Iohexol] Hives  . Prednisone Nausea And Vomiting and Other (See  Comments)    unknown    HOME MEDICATIONS: Outpatient Medications Prior to Visit  Medication Sig Dispense Refill  . aspirin 81 MG tablet Take 81 mg by mouth daily.      . fludrocortisone (FLORINEF) 0.1 MG tablet Take 0.2 mg by mouth 2 (two) times daily.     . hydrocortisone (CORTEF) 10 MG tablet Take 10 mg by mouth daily.    Marland Kitchen levETIRAcetam (KEPPRA) 1000 MG tablet TAKE 1 TABLET BY MOUTH TWICE DAILY 180 tablet 1  . lisinopril (PRINIVIL,ZESTRIL) 20 MG tablet Take 20 mg by mouth daily.    . Melatonin 5 MG TABS Take by mouth at bedtime.    . memantine (NAMENDA) 10 MG tablet Take 1 tablet (10 mg total) by mouth 2 (two) times daily. 180 tablet 3  . mirabegron ER (MYRBETRIQ) 25 MG TB24 tablet Take 1 tablet (25 mg total) by mouth daily. 90 tablet 3  . pramipexole (MIRAPEX) 0.125 MG tablet TAKE 1 TABLET BY MOUTH TWICE DAILY 180 tablet 0  . Sod Fluoride-Potassium Nitrate 1.1-5 % PSTE Place 1 application onto teeth 2 (two) times daily.    Marland Kitchen venlafaxine XR (EFFEXOR-XR) 75 MG 24 hr capsule TAKE ONE CAPSULE BY MOUTH THREE TIMES DAILY 90 capsule 11   No facility-administered medications prior to visit.     PAST MEDICAL HISTORY: Past Medical History:  Diagnosis Date  . Anxiety   . Aqueductal stenosis ([place])   . Atrial fibrillation ([place])   .  Cardiomyopathy   . Carpal tunnel syndrome   . Carpal tunnel syndrome   . Chiari I malformation ([place])   . Coronary artery disease   . Depression   . Headache(784.0)   . Hydrocephalus   . Memory loss   . Obstructive sleep apnea   . Pulmonary embolism ([place])   . Renal calculi   . Restless leg syndrome   . Salt-wasting syndrome of infancy   . Seizure ([place])   . Seizures ([place])   . SIADH (syndrome of inappropriate ADH production) ([place])   . Syncope    Recurrent, thought to be neurally mediated    PAST SURGICAL HISTORY: Past Surgical History:  Procedure Laterality Date  . CHOLECYSTECTOMY    . LOOP RECORDER EXPLANT N/A 10/30/2012   Procedure: LOOP RECORDER  EXPLANT;  Surgeon: Deboraha Sprang, MD;  Location: [facility] CATH LAB;  Service: Cardiovascular;  Laterality: N/A;  . St. Jude DM 2112100 loop     S/p loop recorder  . VENTRICULOPERITONEAL SHUNT Bilateral     FAMILY HISTORY: Family History  Problem Relation Age of Onset  . Migraines Mother   . Seizures Neg Hx     SOCIAL HISTORY: Social History   Socioeconomic History  . Marital status: Married    Spouse name: [name]  . Number of children: 0  . Years of education: 26  . Highest education level: Not on file  Social Needs  . Financial resource strain: Not on file  . Food insecurity - worry: Not on file  . Food insecurity - inability: Not on file  . Transportation needs - medical: Not on file  . Transportation needs - non-medical: Not on file  Occupational History  . Occupation: CUSTOMER SERVICE    Employer: August: Not working   Tobacco Use  . Smoking status: Never Smoker  . Smokeless tobacco: Never Used  Substance and Sexual Activity  . Alcohol use: No    Alcohol/week: 0.0 oz  . Drug use: No  . Sexual activity: Not on file  Other Topics Concern  . Not on file  Social History Narrative   Patient is married [name]), no children   Patient is right handed   Education level is high school graduate   Caffeine consumption is 1/2 of 2L daily      PHYSICAL EXAM  Vitals:   01/10/17 1530  BP: (!) 144/90  Pulse: 65  Weight: (!) 351 lb 12.8 oz (159.6 kg)  Height: 6\' 5"  (1.956 m)   Body mass index is 41.72 kg/m.   MMSE - Mini Mental State Exam 01/10/2017 07/05/2016 01/05/2016  Orientation to time 5 5 4   Orientation to Place 5 5 5   Registration 3 3 3   Attention/ Calculation 5 5 5   Recall 3 1 2   Language- name 2 objects 2 2 2   Language- repeat 1 1 1   Language- follow 3 step command 3 3 3   Language- read & follow direction 1 1 1   Write a sentence 1 1 1   Copy design 1 1 1   Total score 30 28 28      Generalized: Well developed, in no acute distress    Neurological examination  Mentation: Alert oriented to time, place, history taking. Follows all commands speech and language fluent Cranial nerve II-XII: Pupils were equal round reactive to light. Extraocular movements were full, visual field were full on confrontational test. Facial sensation and strength were normal. Uvula tongue midline. Head turning and shoulder shrug  were normal  and symmetric. Motor: The motor testing reveals 5 over 5 strength of all 4 extremities. Good symmetric motor tone is noted throughout.  Sensory: Sensory testing is intact to soft touch on all 4 extremities. No evidence of extinction is noted.  Coordination: Cerebellar testing reveals good finger-nose-finger and heel-to-shin bilaterally.  Gait and station: Gait is normal.   DIAGNOSTIC DATA (LABS, IMAGING, TESTING) - I reviewed patient records, labs, notes, testing and imaging myself where available.  Lab Results  Component Value Date   WBC 10.0 08/07/2011   HGB 13.9 08/07/2011   HCT 41.0 08/07/2011   MCV 83.5 08/07/2011   PLT 304 08/07/2011      Component Value Date/Time   NA 137 08/07/2011 0052   K 4.1 08/07/2011 0052   CL 100 08/07/2011 0052   CO2 27 02/16/2011 2056   GLUCOSE 88 08/07/2011 0052   BUN 12 08/07/2011 0052   CREATININE 0.90 08/07/2011 0052   CALCIUM 10.2 02/16/2011 2056   PROT 7.7 02/16/2011 2056   ALBUMIN 4.0 02/16/2011 2056   AST 30 02/16/2011 2056   ALT 38 02/16/2011 2056   ALKPHOS 81 02/16/2011 2056   BILITOT 0.4 02/16/2011 2056   GFRNONAA >90 02/16/2011 2056   GFRAA >90 02/16/2011 2056   No results found for: CHOL, HDL, LDLCALC, LDLDIRECT, TRIG, CHOLHDL No results found for: HGBA1C Lab Results  Component Value Date   VITAMINB12 640 01/05/2016   Lab Results  Component Value Date   TSH 2.440 01/05/2016      ASSESSMENT AND PLAN 51 y.o. 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[place]), Atrial fibrillation ([place]), Cardiomyopathy, Carpal  tunnel syndrome, Carpal tunnel syndrome, Chiari I malformation ([place]), Coronary artery disease, Depression, Headache(784.0), Hydrocephalus, Memory loss, Obstructive sleep apnea, Pulmonary embolism ([place]), Renal calculi, Restless leg syndrome, Salt-wasting syndrome of infancy, Seizure ([place]), Seizures ([place]), SIADH (syndrome of inappropriate ADH production) ([place]), and Syncope. here with :  1.  Seizures 2.  Memory disturbance  Overall the patient is doing well.  He will continue on Keppra thousand milligrams twice a day.  His memory score has remained stable.  He will continue on Namenda.  He continues to have freezing episodes he will let us know.  He will follow-up in 6 months or sooner if needed.     Ward Givens, MSN, NP-C 01/10/2017, 3:25 PM [facility] [address] [place], [place] [address] [phone]

## 2017-01-12 ENCOUNTER — Other Ambulatory Visit: Payer: Self-pay | Admitting: Neurology

## 2017-03-22 DIAGNOSIS — E291 Testicular hypofunction: Secondary | ICD-10-CM | POA: Diagnosis not present

## 2017-03-22 DIAGNOSIS — R7303 Prediabetes: Secondary | ICD-10-CM | POA: Diagnosis not present

## 2017-03-22 DIAGNOSIS — E2740 Unspecified adrenocortical insufficiency: Secondary | ICD-10-CM | POA: Diagnosis not present

## 2017-03-22 DIAGNOSIS — E871 Hypo-osmolality and hyponatremia: Secondary | ICD-10-CM | POA: Diagnosis not present

## 2017-06-10 ENCOUNTER — Other Ambulatory Visit: Payer: Self-pay | Admitting: Neurology

## 2017-07-06 ENCOUNTER — Other Ambulatory Visit: Payer: Self-pay | Admitting: Neurology

## 2017-07-12 ENCOUNTER — Ambulatory Visit (INDEPENDENT_AMBULATORY_CARE_PROVIDER_SITE_OTHER): Payer: PPO | Admitting: Adult Health

## 2017-07-12 ENCOUNTER — Encounter: Payer: Self-pay | Admitting: Adult Health

## 2017-07-12 VITALS — BP 144/95 | HR 68 | Ht 77.0 in | Wt 354.6 lb

## 2017-07-12 DIAGNOSIS — R569 Unspecified convulsions: Secondary | ICD-10-CM | POA: Diagnosis not present

## 2017-07-12 DIAGNOSIS — R413 Other amnesia: Secondary | ICD-10-CM

## 2017-07-12 MED ORDER — LEVETIRACETAM 1000 MG PO TABS: 1000.0000 mg | ORAL_TABLET | Freq: Two times a day (BID) | ORAL | 3 refills | Status: DC

## 2017-07-12 NOTE — Patient Instructions (Signed)
Your Plan:  Continue Keppra  If your symptoms worsen or you develop new symptoms please let us know.    Thank you for coming to see us at [facility]. I hope we have been able to provide you high quality care today.  You may receive a patient satisfaction survey over the next few weeks. We would appreciate your feedback and comments so that we may continue to improve ourselves and the health of our patients.  

## 2017-07-12 NOTE — Progress Notes (Signed)
I have read the note, and I agree with the clinical assessment and plan.  [name]   

## 2017-07-12 NOTE — Progress Notes (Signed)
PATIENT: [name] DOB: 04/06/1965  REASON FOR VISIT: follow up HISTORY FROM: patient  HISTORY OF PRESENT ILLNESS: Today 07/12/17 [name] is a 52 year old male with a history of seizures and memory disturbance.  He returns today for follow-up.  He reports that he has not had any seizure events.  He continues on Keppra 1000 mg twice a day.  He reports that his memory has remained stable.  He lives at home with his wife.  He is able to complete all ADLs independently.  He does not operate a motor vehicle.  He returns today for an evaluation.  HISTORY 01/10/17 [name]-year-old male with a history of memory disturbance.  He returns today for follow-up.  Overall patient states that he has been doing well.  He denies any seizure events.  He continues on Keppra thousand milligrams twice a day.  He does state that he had an episode 3 days ago where he was piling up clothes.  He states that he temporarily felt as if he cannot move his arms.  He states that this only lasted for several seconds.  He states that tonight before he did not sleep well.  He reports that he has had similar event several months ago.  He is able to complete all ADLs independently.  He no longer operates a Teacher, music.  He returns today for evaluation.   REVIEW OF SYSTEMS: Out of a complete 14 system review of symptoms, the patient complains only of the following symptoms, and all other reviewed systems are negative.  See HPI  ALLERGIES: Allergies  Allergen Reactions  . Omnipaque [Iohexol] Hives  . Prednisone Nausea And Vomiting and Other (See Comments)    unknown    HOME MEDICATIONS: Outpatient Medications Prior to Visit  Medication Sig Dispense Refill  . aspirin 81 MG tablet Take 81 mg by mouth daily.      . fludrocortisone (FLORINEF) 0.1 MG tablet Take 0.2 mg by mouth 2 (two) times daily.     . hydrocortisone (CORTEF) 10 MG tablet Take 10 mg by mouth daily.    Marland Kitchen levETIRAcetam (KEPPRA) 1000 MG  tablet TAKE 1 TABLET BY MOUTH TWICE DAILY 180 tablet 0  . lisinopril (PRINIVIL,ZESTRIL) 20 MG tablet Take 20 mg by mouth daily.    . Melatonin 5 MG TABS Take by mouth at bedtime.    . memantine (NAMENDA) 10 MG tablet Take 1 tablet (10 mg total) by mouth 2 (two) times daily. 180 tablet 3  . MYRBETRIQ 25 MG TB24 tablet TAKE 1 TABLET BY MOUTH ONCE DAILY 90 tablet 3  . pramipexole (MIRAPEX) 0.125 MG tablet TAKE 1 TABLET BY MOUTH TWICE DAILY 180 tablet 0  . Sod Fluoride-Potassium Nitrate 1.1-5 % PSTE Place 1 application onto teeth 2 (two) times daily.    Marland Kitchen venlafaxine XR (EFFEXOR-XR) 75 MG 24 hr capsule TAKE ONE CAPSULE BY MOUTH THREE TIMES DAILY 90 capsule 11   No facility-administered medications prior to visit.     PAST MEDICAL HISTORY: Past Medical History:  Diagnosis Date  . Anxiety   . 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 Atrial fibrillation ([place])   . Cardiomyopathy   . Carpal tunnel syndrome   . Carpal tunnel syndrome   . Chiari I malformation ([place])   . Coronary artery disease   . Depression   . Headache(784.0)   . Hydrocephalus   . Memory loss   . Obstructive sleep apnea   . Pulmonary embolism ([place])   . Renal  calculi   . Restless leg syndrome   . Salt-wasting syndrome of infancy   . Seizure ([place])   . Seizures ([place])   . SIADH (syndrome of inappropriate ADH production) ([place])   . Syncope    Recurrent, thought to be neurally mediated    PAST SURGICAL HISTORY: Past Surgical History:  Procedure Laterality Date  . CHOLECYSTECTOMY    . LOOP RECORDER EXPLANT N/A 10/30/2012   Procedure: LOOP RECORDER EXPLANT;  Surgeon: Deboraha Sprang, MD;  Location: [facility] CATH LAB;  Service: Cardiovascular;  Laterality: N/A;  . St. Jude DM 2112100 loop     S/p loop recorder  . VENTRICULOPERITONEAL SHUNT Bilateral     FAMILY HISTORY: Family History  Problem Relation Age of Onset  . Migraines Mother   . Seizures Neg Hx     SOCIAL HISTORY: Social History   Socioeconomic History  . Marital  status: Married    Spouse name: [name]  . Number of children: 0  . Years of education: 60  . Highest education level: Not on file  Occupational History  . Occupation: CUSTOMER SERVICE    Employer: [employer]: Not working   Social Needs  . Financial resource strain: Not on file  . Food insecurity:    Worry: Not on file    Inability: Not on file  . Transportation needs:    Medical: Not on file    Non-medical: Not on file  Tobacco Use  . Smoking status: Never Smoker  . Smokeless tobacco: Never Used  Substance and Sexual Activity  . Alcohol use: No    Alcohol/week: 0.0 oz  . Drug use: No  . Sexual activity: Not on file  Lifestyle  . Physical activity:    Days per week: Not on file    Minutes per session: Not on file  . Stress: Not on file  Relationships  . Social connections:    Talks on phone: Not on file    Gets together: Not on file    Attends religious service: Not on file    Active member of club or organization: Not on file    Attends meetings of clubs or organizations: Not on file    Relationship status: Not on file  . Intimate partner violence:    Fear of current or ex partner: Not on file    Emotionally abused: Not on file    Physically abused: Not on file    Forced sexual activity: Not on file  Other Topics Concern  . Not on file  Social History Narrative   Patient is married [name]), no children   Patient is right handed   Education level is high school graduate   Caffeine consumption is 1/2 of 2L daily      PHYSICAL EXAM  Vitals:   07/12/17 1447  BP: (!) 144/95  Pulse: 68  Weight: (!) 354 lb 9.6 oz (160.8 kg)  Height: 6\' 5"  (1.956 m)   Body mass index is 42.05 kg/m.   MMSE - Mini Mental State Exam 07/12/2017 01/10/2017 07/05/2016  Orientation to time 5 5 5   Orientation to Place 4 5 5   Registration 3 3 3   Attention/ Calculation 5 5 5   Recall 1 3 1   Language- name 2 objects 2 2 2   Language- repeat 1 1 1   Language- follow 3  step command 3 3 3   Language- read & follow direction 1 1 1   Write a sentence 1 1 1   Copy design 1  1 1  Total score 27 30 28      Generalized: Well developed, in no acute distress   Neurological examination  Mentation: Alert oriented to time, place, history taking. Follows all commands speech and language fluent Cranial nerve II-XII: Pupils were equal round reactive to light. Extraocular movements were full, visual field were full on confrontational test. Facial sensation and strength were normal. Uvula tongue midline. Head turning and shoulder shrug  were normal and symmetric. Motor: The motor testing reveals 5 over 5 strength of all 4 extremities. Good symmetric motor tone is noted throughout.  Sensory: Sensory testing is intact to soft touch on all 4 extremities. No evidence of extinction is noted.  Coordination: Cerebellar testing reveals good finger-nose-finger and heel-to-shin bilaterally.  Gait and station: Gait is normal. Tandem gait is unsteady.  Romberg is negative. No drift is seen.  Reflexes: Deep tendon reflexes are symmetric and normal bilaterally.   DIAGNOSTIC DATA (LABS, IMAGING, TESTING) - I reviewed patient records, labs, notes, testing and imaging myself where available.  Lab Results  Component Value Date   WBC 10.0 08/07/2011   HGB 13.9 08/07/2011   HCT 41.0 08/07/2011   MCV 83.5 08/07/2011   PLT 304 08/07/2011      Component Value Date/Time   NA 137 08/07/2011 0052   K 4.1 08/07/2011 0052   CL 100 08/07/2011 0052   CO2 27 02/16/2011 2056   GLUCOSE 88 08/07/2011 0052   BUN 12 08/07/2011 0052   CREATININE 0.90 08/07/2011 0052   CALCIUM 10.2 02/16/2011 2056   PROT 7.7 02/16/2011 2056   ALBUMIN 4.0 02/16/2011 2056   AST 30 02/16/2011 2056   ALT 38 02/16/2011 2056   ALKPHOS 81 02/16/2011 2056   BILITOT 0.4 02/16/2011 2056   GFRNONAA >90 02/16/2011 2056   GFRAA >90 02/16/2011 2056    Lab Results  Component Value Date   VITAMINB12 640 01/05/2016    Lab Results  Component Value Date   TSH 2.440 01/05/2016      ASSESSMENT AND PLAN 52 y.o. 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 here with:  1.  Seizures 2.  Memory disturbance  Overall the patient is doing well.  He will continue on Keppra 1000 mg twice a day.  His memory score has remained stable.  We will continue to monitor over time.  He is advised that if his symptoms worsen or he develops new symptoms he should let us know.  He will follow-up in 1 year or sooner if needed.      Ward Givens, MSN, NP-C 07/12/2017, 3:15 PM [facility] [address], [place], [place] [address] [phone]

## 2017-08-12 ENCOUNTER — Other Ambulatory Visit: Payer: Self-pay | Admitting: Adult Health

## 2017-09-01 ENCOUNTER — Other Ambulatory Visit: Payer: Self-pay | Admitting: Neurology

## 2017-09-03 NOTE — Telephone Encounter (Signed)
Refill for Mematine 10 mg refilled to [facility]. MB RN

## 2017-09-19 ENCOUNTER — Telehealth: Payer: Self-pay | Admitting: Adult Health

## 2017-09-19 NOTE — Telephone Encounter (Signed)
Tech Martinique from [facility] @[phone] says pt transferred this pramipexole (MIRAPEX) 0.125 MG tablet along with other prescriptions from  [place], [place] - [place] [phone] (Phone) [phone] (Fax)   Tech Martinique is asking if pt can get a refill, please call

## 2017-09-20 MED ORDER — PRAMIPEXOLE DIHYDROCHLORIDE 0.125 MG PO TABS: 0.1250 mg | ORAL_TABLET | Freq: Two times a day (BID) | ORAL | 1 refills | Status: DC

## 2017-09-20 NOTE — Addendum Note (Signed)
Addended by: Colbey Melnick on: 09/20/2017 11:12 AM   Modules accepted: Orders

## 2017-09-20 NOTE — Telephone Encounter (Addendum)
[facility]oke with [facility] and the [facility] transfer did not have mirapex refill.  I went ahead and refilled , mistakenly filled at [facility], called and cancelled and then renewed at new drug store of pts choice, [facility].

## 2017-10-08 ENCOUNTER — Other Ambulatory Visit: Payer: Self-pay | Admitting: Neurology

## 2017-10-16 DIAGNOSIS — F331 Major depressive disorder, recurrent, moderate: Secondary | ICD-10-CM | POA: Diagnosis not present

## 2017-10-16 DIAGNOSIS — I480 Paroxysmal atrial fibrillation: Secondary | ICD-10-CM | POA: Diagnosis not present

## 2017-10-16 DIAGNOSIS — E222 Syndrome of inappropriate secretion of antidiuretic hormone: Secondary | ICD-10-CM | POA: Diagnosis not present

## 2017-10-16 DIAGNOSIS — I10 Essential (primary) hypertension: Secondary | ICD-10-CM | POA: Diagnosis not present

## 2017-10-16 DIAGNOSIS — Z23 Encounter for immunization: Secondary | ICD-10-CM | POA: Diagnosis not present

## 2018-03-12 DIAGNOSIS — H6123 Impacted cerumen, bilateral: Secondary | ICD-10-CM | POA: Diagnosis not present

## 2018-03-12 DIAGNOSIS — R42 Dizziness and giddiness: Secondary | ICD-10-CM | POA: Diagnosis not present

## 2018-03-12 DIAGNOSIS — I10 Essential (primary) hypertension: Secondary | ICD-10-CM | POA: Diagnosis not present

## 2018-03-27 DIAGNOSIS — E2740 Unspecified adrenocortical insufficiency: Secondary | ICD-10-CM | POA: Diagnosis not present

## 2018-03-27 DIAGNOSIS — R7303 Prediabetes: Secondary | ICD-10-CM | POA: Diagnosis not present

## 2018-03-27 DIAGNOSIS — E871 Hypo-osmolality and hyponatremia: Secondary | ICD-10-CM | POA: Diagnosis not present

## 2018-07-16 ENCOUNTER — Ambulatory Visit: Payer: PPO | Admitting: Adult Health

## 2018-08-06 ENCOUNTER — Other Ambulatory Visit: Payer: Self-pay | Admitting: Adult Health

## 2018-09-27 ENCOUNTER — Other Ambulatory Visit: Payer: Self-pay | Admitting: Adult Health

## 2018-10-02 ENCOUNTER — Other Ambulatory Visit: Payer: Self-pay | Admitting: Adult Health

## 2018-10-02 NOTE — Telephone Encounter (Signed)
Pt called pharmacy and was informed to call our office to request a refill on his levETIRAcetam (KEPPRA) 1000 MG tablet. Please send to the [facility] on [address]

## 2018-10-02 NOTE — Telephone Encounter (Signed)
Refill disp #120 tabs sent to [facility] [address], [place] Alert with note to pharmacy: patient must keep 11/04/18 FU for further refills.

## 2018-10-28 ENCOUNTER — Other Ambulatory Visit: Payer: Self-pay | Admitting: Adult Health

## 2018-10-28 ENCOUNTER — Other Ambulatory Visit: Payer: Self-pay | Admitting: Neurology

## 2018-11-04 ENCOUNTER — Other Ambulatory Visit: Payer: Self-pay

## 2018-11-04 ENCOUNTER — Ambulatory Visit (INDEPENDENT_AMBULATORY_CARE_PROVIDER_SITE_OTHER): Payer: PPO | Admitting: Adult Health

## 2018-11-04 VITALS — BP 127/80 | HR 86 | Temp 98.0°F | Ht 77.0 in | Wt 357.2 lb

## 2018-11-04 DIAGNOSIS — R569 Unspecified convulsions: Secondary | ICD-10-CM

## 2018-11-04 DIAGNOSIS — R413 Other amnesia: Secondary | ICD-10-CM | POA: Diagnosis not present

## 2018-11-04 NOTE — Patient Instructions (Addendum)
Your Plan:  Continue Keppra 1000 mg twice a day Memory score has declined slightly.  We will continue to monitor. If your symptoms worsen or you develop new symptoms please let us know.   Thank you for coming to see [place] at [facility]. I hope we have been able to provide you high quality care today.  You may receive a patient satisfaction survey over the next few weeks. We would appreciate your feedback and comments so that we may continue to improve ourselves and the health of our patients.

## 2018-11-04 NOTE — Progress Notes (Signed)
I have read the note, and I agree with the clinical assessment and plan.  [place] K Taje Tondreau   

## 2018-11-04 NOTE — Progress Notes (Signed)
PATIENT: [name] DOB: 1965-03-18  REASON FOR VISIT: follow up HISTORY FROM: patient  HISTORY OF PRESENT ILLNESS: Today 11/04/18:  [name] is a 53 year old male with a history of seizures and memory disturbance.  He returns today for follow-up.  He remains on Keppra 1000 mg twice a day.  He denies any seizure events.  He does report that his wife passed away approximately 2 weeks ago.  He feels that his memory may be slightly worse.  He is able to complete all ADLs independently.  He does not operate a motor vehicle.  He manages his own finances.  He is able to prepare his own meals.  His dad is with him today.  His diagnosis sometimes he forgets things when I call him.  He returns today for an evaluation.  HISTORY 07/12/17 Mr. Sullivant is a 53 year old male with a history of seizures and memory disturbance.  He returns today for follow-up.  He reports that he has not had any seizure events.  He continues on Keppra 1000 mg twice a day.  He reports that his memory has remained stable.  He lives at home with his wife.  He is able to complete all ADLs independently.  He does not operate a motor vehicle.  He returns today for an evaluation.  REVIEW OF SYSTEMS: Out of a complete 14 system review of symptoms, the patient complains only of the following symptoms, and all other reviewed systems are negative.  See HPI  ALLERGIES: Allergies  Allergen Reactions  . Omnipaque [Iohexol] Hives  . Prednisone Nausea And Vomiting and Other (See Comments)    unknown    HOME MEDICATIONS: Outpatient Medications Prior to Visit  Medication Sig Dispense Refill  . aspirin 81 MG tablet Take 81 mg by mouth daily.      . clonazePAM (KLONOPIN) 0.5 MG tablet Take 0.5 mg by mouth daily.    . fludrocortisone (FLORINEF) 0.1 MG tablet Take 0.2 mg by mouth 2 (two) times daily.     . hydrocortisone (CORTEF) 10 MG tablet Take 10 mg by mouth daily.    Marland Kitchen levETIRAcetam (KEPPRA) 1000 MG tablet Take 1 tablet  by mouth twice daily 120 tablet 0  . lisinopril (PRINIVIL,ZESTRIL) 20 MG tablet Take 20 mg by mouth daily.    . Melatonin 5 MG TABS Take by mouth at bedtime.    . memantine (NAMENDA) 10 MG tablet TAKE 1 TABLET BY MOUTH TWICE DAILY 180 tablet 3  . MYRBETRIQ 25 MG TB24 tablet Take 1 tablet by mouth once daily 30 tablet 0  . pramipexole (MIRAPEX) 0.125 MG tablet Take 1 tablet by mouth twice daily 180 tablet 0  . Sod Fluoride-Potassium Nitrate 1.1-5 % PSTE Place 1 application onto teeth 2 (two) times daily.    Marland Kitchen venlafaxine XR (EFFEXOR-XR) 75 MG 24 hr capsule TAKE 1 CAPSULE BY MOUTH THREE TIMES DAILY 90 capsule 0   No facility-administered medications prior to visit.     PAST MEDICAL HISTORY: Past Medical History:  Diagnosis Date  . Anxiety   . 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 Atrial fibrillation ([place])   . Cardiomyopathy   . Carpal tunnel syndrome   . Carpal tunnel syndrome   . Chiari I malformation ([place])   . Coronary artery disease   . Depression   . Headache(784.0)   . Hydrocephalus   . Memory loss   . Obstructive sleep apnea   . Pulmonary embolism ([place])   . Renal calculi   .  Restless leg syndrome   . Salt-wasting syndrome of infancy   . Seizure ([place])   . Seizures ([place])   . SIADH (syndrome of inappropriate ADH production) ([place])   . Syncope    Recurrent, thought to be neurally mediated    PAST SURGICAL HISTORY: Past Surgical History:  Procedure Laterality Date  . CHOLECYSTECTOMY    . LOOP RECORDER EXPLANT N/A 10/30/2012   Procedure: LOOP RECORDER EXPLANT;  Surgeon: Deboraha Sprang, MD;  Location: [facility] CATH LAB;  Service: Cardiovascular;  Laterality: N/A;  . St. Jude DM 2112100 loop     S/p loop recorder  . VENTRICULOPERITONEAL SHUNT Bilateral     FAMILY HISTORY: Family History  Problem Relation Age of Onset  . Migraines Mother   . Seizures Neg Hx     SOCIAL HISTORY: Social History   Socioeconomic History  . Marital status: Married    Spouse name: [name]  .  Number of children: 0  . Years of education: 58  . Highest education level: Not on file  Occupational History  . Occupation: CUSTOMER SERVICE    Employer: [employer]: Not working   Social Needs  . Financial resource strain: Not on file  . Food insecurity    Worry: Not on file    Inability: Not on file  . Transportation needs    Medical: Not on file    Non-medical: Not on file  Tobacco Use  . Smoking status: Never Smoker  . Smokeless tobacco: Never Used  Substance and Sexual Activity  . Alcohol use: No    Alcohol/week: 0.0 standard drinks  . Drug use: No  . Sexual activity: Not on file  Lifestyle  . Physical activity    Days per week: Not on file    Minutes per session: Not on file  . Stress: Not on file  Relationships  . Social Herbalist on phone: Not on file    Gets together: Not on file    Attends religious service: Not on file    Active member of club or organization: Not on file    Attends meetings of clubs or organizations: Not on file    Relationship status: Not on file  . Intimate partner violence    Fear of current or ex partner: Not on file    Emotionally abused: Not on file    Physically abused: Not on file    Forced sexual activity: Not on file  Other Topics Concern  . Not on file  Social History Narrative   Patient is married [name]), no children   Patient is right handed   Education level is high school graduate   Caffeine consumption is 1/2 of 2L daily      PHYSICAL EXAM  Vitals:   11/04/18 1428  BP: 127/80  Pulse: 86  Temp: 98 F (36.7 C)  TempSrc: Oral  Weight: (!) 357 lb 3.2 oz (162 kg)  Height: 6\' 5"  (1.956 m)   Body mass index is 42.36 kg/m. MMSE - Mini Mental State Exam 11/04/2018 07/12/2017 01/10/2017  Not completed: (No Data) - -  Orientation to time 3 5 5   Orientation to Place 5 4 5   Registration 3 3 3   Attention/ Calculation 5 5 5   Recall 0 1 3  Language- name 2 objects 2 2 2   Language- repeat 1 1 1    Language- follow 3 step command 3 3 3   Language- read & follow direction 1 1 1  Write a sentence 1 1 1   Copy design 1 1 1   Total score 25 27 30     Generalized: Well developed, in no acute distress   Neurological examination  Mentation: Alert oriented to time, place, history taking. Follows all commands speech and language fluent Cranial nerve II-XII: Pupils were equal round reactive to light. Extraocular movements were full, visual field were full on confrontational test. Head turning and shoulder shrug  were normal and symmetric. Motor: The motor testing reveals 5 over 5 strength of all 4 extremities. Good symmetric motor tone is noted throughout.  Sensory: Sensory testing is intact to soft touch on all 4 extremities. No evidence of extinction is noted.  Coordination: Cerebellar testing reveals good finger-nose-finger and heel-to-shin bilaterally.  Gait and station: Gait is normal.  Reflexes: Deep tendon reflexes are symmetric and normal bilaterally.   DIAGNOSTIC DATA (LABS, IMAGING, TESTING) - I reviewed patient records, labs, notes, testing and imaging myself where available.  Lab Results  Component Value Date   WBC 10.0 08/07/2011   HGB 13.9 08/07/2011   HCT 41.0 08/07/2011   MCV 83.5 08/07/2011   PLT 304 08/07/2011      Component Value Date/Time   NA 137 08/07/2011 0052   K 4.1 08/07/2011 0052   CL 100 08/07/2011 0052   CO2 27 02/16/2011 2056   GLUCOSE 88 08/07/2011 0052   BUN 12 08/07/2011 0052   CREATININE 0.90 08/07/2011 0052   CALCIUM 10.2 02/16/2011 2056   PROT 7.7 02/16/2011 2056   ALBUMIN 4.0 02/16/2011 2056   AST 30 02/16/2011 2056   ALT 38 02/16/2011 2056   ALKPHOS 81 02/16/2011 2056   BILITOT 0.4 02/16/2011 2056   GFRNONAA >90 02/16/2011 2056   GFRAA >90 02/16/2011 2056   No results found for: CHOL, HDL, LDLCALC, LDLDIRECT, TRIG, CHOLHDL No results found for: HGBA1C Lab Results  Component Value Date   VITAMINB12 640 01/05/2016   Lab Results   Component Value Date   TSH 2.440 01/05/2016      ASSESSMENT AND PLAN 52 y.o. 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), Cardiomyopathy, Carpal tunnel syndrome, Carpal tunnel syndrome, Chiari I malformation ([place]), Coronary artery disease, Depression, Headache(784.0), Hydrocephalus, Memory loss, Obstructive sleep apnea, Pulmonary embolism ([place]), Renal calculi, Restless leg syndrome, Salt-wasting syndrome of infancy, Seizure ([place]), Seizures ([place]), SIADH (syndrome of inappropriate ADH production) ([place]), and Syncope. here with :  1.  Seizures 2.  Memory disturbance  Overall the patient is doing well.  He will continue on Keppra 1000 mg twice a day.  His memory score has slightly declined since the last visit.  This could be due to recent death of his wife.  We will continue to monitor for now.  May consider neuropsychological testing in the future.  I have advised that if his symptoms worsen or he develops new symptoms he should let us know.  He will follow-up in 6 months or sooner if needed.   Ward Givens, MSN, NP-C 11/04/2018, 2:54 PM [facility] [address], [place], [place] [address] [phone]

## 2018-12-02 ENCOUNTER — Other Ambulatory Visit: Payer: Self-pay | Admitting: Adult Health

## 2018-12-02 ENCOUNTER — Other Ambulatory Visit: Payer: Self-pay | Admitting: Neurology

## 2018-12-20 ENCOUNTER — Other Ambulatory Visit: Payer: Self-pay | Admitting: Adult Health

## 2018-12-28 ENCOUNTER — Other Ambulatory Visit: Payer: Self-pay | Admitting: Neurology

## 2018-12-28 ENCOUNTER — Other Ambulatory Visit: Payer: Self-pay | Admitting: Adult Health

## 2018-12-31 ENCOUNTER — Telehealth: Payer: Self-pay | Admitting: Adult Health

## 2018-12-31 MED ORDER — MIRABEGRON ER 25 MG PO TB24: 25.0000 mg | ORAL_TABLET | Freq: Every day | ORAL | 0 refills | Status: DC

## 2018-12-31 NOTE — Addendum Note (Signed)
Addended by: Jontez Melnick on: 12/31/2018 04:25 PM   Modules accepted: Orders

## 2018-12-31 NOTE — Telephone Encounter (Signed)
Pt is asking for refills on his MYRBETRIQ 25 MG TB24 tablet [place] [phone]

## 2019-01-31 ENCOUNTER — Other Ambulatory Visit: Payer: Self-pay | Admitting: Adult Health

## 2019-02-24 ENCOUNTER — Other Ambulatory Visit: Payer: Self-pay | Admitting: Family Medicine

## 2019-03-11 ENCOUNTER — Other Ambulatory Visit: Payer: Self-pay

## 2019-03-11 ENCOUNTER — Encounter: Payer: Self-pay | Admitting: Family Medicine

## 2019-03-11 ENCOUNTER — Ambulatory Visit (INDEPENDENT_AMBULATORY_CARE_PROVIDER_SITE_OTHER): Payer: PPO | Admitting: Family Medicine

## 2019-03-11 VITALS — BP 124/88 | HR 78 | Temp 98.2°F | Resp 18 | Ht 75.0 in | Wt 364.0 lb

## 2019-03-11 DIAGNOSIS — F320 Major depressive disorder, single episode, mild: Secondary | ICD-10-CM

## 2019-03-11 DIAGNOSIS — R569 Unspecified convulsions: Secondary | ICD-10-CM | POA: Diagnosis not present

## 2019-03-11 DIAGNOSIS — E871 Hypo-osmolality and hyponatremia: Secondary | ICD-10-CM | POA: Diagnosis not present

## 2019-03-11 DIAGNOSIS — Q039 Congenital hydrocephalus, unspecified: Secondary | ICD-10-CM

## 2019-03-11 DIAGNOSIS — R7303 Prediabetes: Secondary | ICD-10-CM | POA: Diagnosis not present

## 2019-03-11 DIAGNOSIS — Z6841 Body Mass Index (BMI) 40.0 and over, adult: Secondary | ICD-10-CM

## 2019-03-11 DIAGNOSIS — E2740 Unspecified adrenocortical insufficiency: Secondary | ICD-10-CM

## 2019-03-11 MED ORDER — VENLAFAXINE HCL ER 75 MG PO CP24: 225.0000 mg | ORAL_CAPSULE | Freq: Every day | ORAL | 1 refills | Status: DC

## 2019-03-11 MED ORDER — LISINOPRIL 10 MG PO TABS: ORAL_TABLET | ORAL | 0 refills | Status: DC

## 2019-03-11 MED ORDER — CLONAZEPAM 0.5 MG PO TABS: 0.5000 mg | ORAL_TABLET | Freq: Every day | ORAL | 2 refills | Status: DC

## 2019-03-11 NOTE — Progress Notes (Signed)
Subjective:  Patient ID: [name], male    DOB: September 21, 1965  Age: 54 y.o. MRN: [id number]  Chief Complaint  Patient presents with  . Hypertension  . Depression  . Atrial Fibrillation  . Seizures    Hypertension This is a chronic problem. The current episode started more than 1 year ago. The problem is controlled. Pertinent negatives include no chest pain, headaches or shortness of breath. There are no associated agents to hypertension. Compliance problems include diet and exercise.    Depression: Patient is well controlled on venlafaxine and clonazepam.  Denies sadness or anhedonia. Hydrocephalus: Patient has seen neurology and neurosurgery..  He has a shunt placed.  He has a history of seizures has been well controlled on Keppra.  He has not had a seizure for over a year.  He also does have restless leg syndrome which responds well to pramipexole 0.125 mg 1 daily at night.  He does have memory issues which are being treated with Namenda. Adrenal insufficiency: Patient is currently on Florinef and Cortef for the treatment of adrenal insufficiency.  He has been stable for some time now.  His hyponatremia is treated with sodium chloride 1000 mg 3 times a day. Social Hx   Social History   Socioeconomic History  . Marital status: Married    Spouse name: [name]  . Number of children: 0  . Years of education: 20  . Highest education level: Not on file  Occupational History  . Occupation: CUSTOMER SERVICE    Employer: [employer]: Not working   Tobacco Use  . Smoking status: Never Smoker  . Smokeless tobacco: Never Used  Substance and Sexual Activity  . Alcohol use: No    Alcohol/week: 0.0 standard drinks  . Drug use: No  . Sexual activity: Not on file  Other Topics Concern  . Not on file  Social History Narrative   Patient is married [name]), no children   Patient is right handed   Education level is high school graduate   Caffeine consumption is 1/2 of 2L  daily   Social Determinants of Health   Financial Resource Strain:   . Difficulty of Paying Living Expenses: Not on file  Food Insecurity:   . Worried About Charity fundraiser in the Last Year: Not on file  . Ran Out of Food in the Last Year: Not on file  Transportation Needs:   . Lack of Transportation (Medical): Not on file  . Lack of Transportation (Non-Medical): Not on file  Physical Activity:   . Days of Exercise per Week: Not on file  . Minutes of Exercise per Session: Not on file  Stress:   . Feeling of Stress : Not on file  Social Connections:   . Frequency of Communication with Friends and Family: Not on file  . Frequency of Social Gatherings with Friends and Family: Not on file  . Attends Religious Services: Not on file  . Active Member of Clubs or Organizations: Not on file  . Attends Archivist Meetings: Not on file  . Marital Status: Not on file   Past Medical History:  Diagnosis Date  . Anxiety   . 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 Atrial fibrillation ([place])   . Atrial fibrillation ([place])   . Cardiomyopathy   . Carpal tunnel syndrome   . Carpal tunnel syndrome   . Chiari I malformation ([place])   . Coronary artery disease   . Depression   .  Headache(784.0)   . Hydrocephalus ([place])   . Hydrocephalus ([place])   . Hypertension   . Memory loss   . Obstructive sleep apnea   . Pulmonary embolism ([place])   . Renal calculi   . Restless leg syndrome   . Salt-wasting syndrome of infancy   . Seizure ([place])   . Seizures ([place])   . SIADH (syndrome of inappropriate ADH production) ([place])   . SIADH (syndrome of inappropriate ADH production) ([place])   . Syncope    Recurrent, thought to be neurally mediated   Family History  Problem Relation Age of Onset  . Migraines Mother   . CAD Mother   . Hypertension Mother   . Endometriosis Sister   . Seizures Neg Hx    Review of Systems  Constitutional: Negative for chills, fatigue and fever.  HENT: Negative for congestion,  ear pain and sore throat.   Eyes: Negative for visual disturbance.  Respiratory: Negative for cough and shortness of breath.   Cardiovascular: Negative for chest pain.  Gastrointestinal: Negative for abdominal pain, constipation, diarrhea, nausea and vomiting.  Endocrine: Positive for polydipsia and polyphagia. Negative for polyuria.  Genitourinary: Negative for dysuria and frequency.  Musculoskeletal: Positive for arthralgias. Negative for myalgias.       LEFT WRIST PAIN. PT FELL ON OUTSTRETCHED HAND. PT IS SCHEDULED TO SEE ORTHOPEDICS TOMORROW.  Neurological: Negative for dizziness, seizures, syncope and headaches.  Psychiatric/Behavioral: Positive for confusion. Negative for dysphoric mood and sleep disturbance.       No dysphoria     Objective:  BP 124/88 (BP Location: Left Arm, Patient Position: Sitting, Cuff Size: Large)   Pulse 78   Temp 98.2 F (36.8 C)   Resp 18   Ht 6\' 3"  (1.905 m)   Wt (!) 364 lb (165.1 kg)   BMI 45.50 kg/m   BP/Weight 03/11/2019 11/04/2018 [id number]  Systolic BP [id number]  Diastolic BP 88 80 95  Wt. (Lbs) 364 357.2 354.6  BMI 45.5 42.36 42.05    Physical Exam Vitals reviewed.  Constitutional:      Appearance: Normal appearance. He is obese.  Cardiovascular:     Rate and Rhythm: Normal rate and regular rhythm.     Pulses: Normal pulses.     Heart sounds: Normal heart sounds.  Pulmonary:     Breath sounds: Normal breath sounds.  Abdominal:     General: Bowel sounds are normal.     Palpations: Abdomen is soft.     Tenderness: There is no abdominal tenderness.  Skin:    General: Skin is warm and dry.  Neurological:     Mental Status: He is alert.  Psychiatric:        Mood and Affect: Mood normal.        Behavior: Behavior normal.     Lab Results  Component Value Date   WBC 9.6 03/11/2019   HGB 15.4 03/11/2019   HCT 44.7 03/11/2019   PLT 364 03/11/2019   GLUCOSE 93 03/11/2019   CHOL 151 03/11/2019   TRIG 155 (H) 03/11/2019    HDL 36 (L) 03/11/2019   LDLCALC 88 03/11/2019   ALT 38 02/16/2011   AST 26 03/11/2019   NA 133 (L) 03/11/2019   K 5.2 03/11/2019   CL 96 03/11/2019   CREATININE 0.78 03/11/2019   BUN 11 03/11/2019   CO2 27 02/16/2011   TSH 2.440 03/11/2019   INR 1.02 08/07/2011   HGBA1C 6.1 (H) 03/11/2019  Assessment & Plan:   Problem List Items Addressed This Visit      Endocrine   Adrenal insufficiency ([place])    Continue current medications.  Well-controlled.  Follows with endocrinology.      Relevant Orders   Comp. 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 This Is his major source of disability. 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 I do recommend he follows with neurosurgery and neurology regularly at least once a year.  Continue his current medications.       Relevant Orders   CBC with Differential/Platelet (Completed)     Other   Seizures ([place])    Well controlled. Continue current treatment.      Relevant Medications   clonazePAM (KLONOPIN) 0.5 MG tablet   Hyponatremia    Continue sodium chloride supplementation.      Relevant Medications   SODIUM CHLORIDE PO   Prediabetes   Relevant Orders   Hemoglobin A1c (Completed)   Morbid obesity ([place])    Recommend eat healthy and exercise.  Patient would benefit from 60+ pounds weight loss.      Relevant Orders   Lipid panel (Completed)   TSH (Completed)   Cardiovascular Risk Assessment (Completed)   BMI 45.0-49.9, adult ([place])    See above.       Other Visit Diagnoses    Major depressive disorder, single episode, mild (HCC)       Relevant Medications   clonazePAM (KLONOPIN) 0.5 MG tablet   venlafaxine XR (EFFEXOR-XR) 75 MG 24 hr capsule      Congenital hydrocephalus Causes balance issues and some memory issues. This Is his major source of disability. 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 I do recommend he follows with  neurosurgery and neurology regularly at least once a year.  Continue his current medications.   Seizures ([place]) Well controlled. Continue current treatment.  Adrenal insufficiency (HCC) Continue current medications.  Well-controlled.  Follows with endocrinology.  Morbid obesity ([place]) Recommend eat healthy and exercise.  Patient would benefit from 60+ pounds weight loss.  BMI 45.0-49.9, adult ([place]) See above.  Hyponatremia Continue sodium chloride supplementation.   Meds ordered this encounter  Medications  . clonazePAM (KLONOPIN) 0.5 MG tablet    Sig: Take 1 tablet (0.5 mg total) by mouth daily.    Dispense:  30 tablet    Refill:  2  . lisinopril (ZESTRIL) 10 MG tablet    Sig: TAKE 1 TABLET BY MOUTH ONCE DAILY. APPT NEEDED FOR FUTURE REFILLS    Dispense:  30 tablet    Refill:  0  . venlafaxine XR (EFFEXOR-XR) 75 MG 24 hr capsule    Sig: Take 3 capsules (225 mg total) by mouth daily.    Dispense:  270 capsule    Refill:  1    AN INDIVIDUALIZED CARE PLAN: was established or reinforced today.   The patient's disease status was assessed using clinical findings on exam, labs, and/or other diagnostic testing, such as xrays, to determine his/her success in meeting treatment goals based on disease specific evidence-based guidelines and found to be well controlled.   SELF MANAGEMENT: The patient and I together assessed ways to personally work towards obtaining the recommended goals  Support needs The patient and/or family needs were assessed and services were offered and not necessary at this time.    Follow-up: Return in about 3 months (around 06/08/2019) for WEIGHT  MANAGEMENT..  A CLINICAL SUMMARY including a written plan identify barriers to care unique to individual due to social or financial issues and help create solutions together. and a patient's and the patient's families understanding of their medical issues and care needs   [place] [phone]

## 2019-03-11 NOTE — Patient Instructions (Signed)
Recommendations:  Work on eating healthy and exercise! See tips below for eating healthy! I would recommend goal of 15 lb weight loss in 3 months.   How to Increase Your Level of Physical Activity Getting regular physical activity is important for your overall health and well-being. Most people do not get enough exercise. There are easy ways to increase your level of physical activity, even if you have not been very active in the past or if you are just starting out. How can increasing my physical activity affect me? Physical activity has many short-term and long-term benefits. Being active on a regular basis can improve your physical and mental health as well as provide other benefits. Physical health benefits  Helping you lose weight or maintain a healthy weight.  Strengthening your muscles and bones.  Reducing your risk of certain long-term (chronic) diseases, including heart disease, cancer, and diabetes.  Being able to move around more easily and for longer periods of time without getting tired (increased stamina).  Improving your ability to fight off illness (enhanced immunity).  Being able to sleep better.  Helping you stay healthy as you get older, including: ? Helping you stay mobile, or capable of walking and moving around. ? Preventing accidents, such as falls. ? Increasing life expectancy. Mental health benefits  Boosting your mood and improving your self-esteem.  Lowering your chance of having mental health problems, such as depression or anxiety.  Helping you feel good about your body. Other benefits  Finding new sources of fun and enjoyment.  Meeting new people who share a common interest. What steps can I take to be more physically active? Getting started  If you have a chronic illness or have not been active for a while, check with your health care provider about how to get started. Ask your health care provider what activities are safe for you.  Start out  slowly. Walking or doing some simple chair exercises is a good place to start, especially if you have not been active before or for a long time.  Set goals that you can work toward. Ask your health care provider how much exercise is best for you. In general, most adults should: ? Do moderate-intensity exercise for at least 150 minutes each week (30 minutes on most days of the week) or vigorous exercise for at least 75 minutes each week, or a combination of these.  Moderate-intensity exercise can include walking at a quick pace, biking, yoga, water aerobics, or gardening.  Vigorous exercise involves activities that take more effort, such as jogging or running, playing sports, swimming laps, or jumping rope. ? Do strength exercises on at least 2 days each week. This can include weight lifting, body weight exercises, and resistance-band exercises.  Consider using a fitness tracker, such as a mobile phone app or a device worn like a watch, that will count the number of steps you take each day. Many people strive to reach 10,000 steps a day. Choosing activities  Try to find activities that you enjoy. You are more likely to commit to an exercise routine if it does not feel like a chore.  If you have bone or joint problems, choose low-impact exercises, like walking or swimming.  Use these tips for being successful with an exercise plan: ? Find a workout partner for accountability. ? Join a group or class, such as an aerobics class, cycling class, or sports team. ? Make family time active. Go for a walk, bike, or swim. ? Include  a variety of exercises each week. Being active in your daily routines Besides your formal exercise plans, you can find ways to do physical activity during your daily routines, such as:  Walking or biking to work or to the store.  Taking the stairs instead of the elevator.  Parking farther away from the door at work or at the store.  Planning walking  meetings.  Walking around while you are on the phone.  Where to find more information  Centers for Disease Control and Prevention: WorkDashboard.es  President's Council on Fitness, Sports & Nutrition: www.fitness.gov  ChooseMyPlate: FormerBoss.no Contact a health care provider if:  You have headaches, muscle aches, or joint pain.  You feel dizzy or light-headed while exercising.  You faint.  You have chest pain while exercising. Summary  Exercise benefits your mind and body at any age, even if you are just starting out.  If you have a chronic illness or have not been active for a while, check with your health care provider before increasing your physical activity.  Choose activities that are safe and enjoyable for you. Ask your health care provider what activities are safe for you.  Start slowly. Tell your health care provider if you have problems as you start to increase your activity level. This information is not intended to replace advice given to you by your health care provider. Make sure you discuss any questions you have with your health care provider. Document Revised: 10/14/2018 Document Reviewed: 08/05/2018 Elsevier Patient Education  [place]. Physical Activity Log Staying physically active is important for your health. Your health care provider may recommend a physical activity plan that includes:  Endurance (aerobic) exercise. Examples include running, swimming, or biking.  Strength exercise. Examples include using weights or resistance bands.  Balance and flexibility exercise. Examples include using a stability ball or doing yoga or tai chi. Contact your health care provider before you start any exercise routine. Ask your health care provider:  What activities are safe for you.  How many hours or minutes you should exercise each week. Endurance exercise My goal is to do _______________________ hours and _______________________  minutes of endurance exercise each week. Date: _______________________  Activity: _______________________  Total hours or minutes: _______________________ Date: _______________________  Activity: _______________________  Total hours or minutes: _______________________ Date: _______________________  Activity: _______________________  Total hours or minutes: _______________________ Date: _______________________  Activity: _______________________  Total hours or minutes: _______________________ Date: _______________________  Activity: _______________________  Total hours or minutes: _______________________ Date: _______________________  Activity: _______________________  Total hours or minutes: _______________________ Date: _______________________  Activity: _______________________  Total hours or minutes: _______________________ Strength exercise My goal is to do _______________________ hours and _______________________ minutes of strength exercise each week. Date: _______________________  Activity: _______________________  Total hours or minutes: _______________________ Date: _______________________  Activity: _______________________  Total hours or minutes: _______________________ Date: _______________________  Activity: _______________________  Total hours or minutes: _______________________ Date: _______________________  Activity: _______________________  Total hours or minutes: _______________________ Date: _______________________  Activity: _______________________  Total hours or minutes: _______________________ Date: _______________________  Activity: _______________________  Total hours or minutes: _______________________ Date: _______________________  Activity: _______________________  Total hours or minutes: _______________________ Balance and flexibility exercise My goal is to do _______________________ hours and  _______________________ minutes of balance and flexibility exercise each week. Date: _______________________  Activity: _______________________  Total hours or minutes: _______________________ Date: _______________________  Activity: _______________________  Total hours or minutes: _______________________ Date: _______________________  Activity: _______________________  Total hours or minutes: _______________________  Date: _______________________  Activity: _______________________  Total hours or minutes: _______________________ Date: _______________________  Activity: _______________________  Total hours or minutes: _______________________ Date: _______________________  Activity: _______________________  Total hours or minutes: _______________________ Date: _______________________  Activity: _______________________  Total hours or minutes: _______________________ This information is not intended to replace advice given to you by your health care provider. Make sure you discuss any questions you have with your health care provider. Document Revised: 09/12/2017 Document Reviewed: 09/12/2017 Elsevier Patient Education  2020 [place] Following a healthy eating pattern may help you to achieve and maintain a healthy body weight, reduce the risk of chronic disease, and live a long and productive life. It is important to follow a healthy eating pattern at an appropriate calorie level for your body. Your nutritional needs should be met primarily through food by choosing a variety of nutrient-rich foods. What are tips for following this plan? Reading food labels  Read labels and choose the following: ? Reduced or low sodium. ? Juices with 100% fruit juice. ? Foods with low saturated fats and high polyunsaturated and monounsaturated fats. ? Foods with whole grains, such as whole wheat, cracked wheat, brown rice, and wild rice. ? Whole grains that are  fortified with folic acid. This is recommended for women who are pregnant or who want to become pregnant.  Read labels and avoid the following: ? Foods with a lot of added sugars. These include foods that contain brown sugar, corn sweetener, corn syrup, dextrose, fructose, glucose, high-fructose corn syrup, honey, invert sugar, lactose, malt syrup, maltose, molasses, raw sugar, sucrose, trehalose, or turbinado sugar.  Do not eat more than the following amounts of added sugar per day:  6 teaspoons (25 g) for women.  9 teaspoons (38 g) for men. ? Foods that contain processed or refined starches and grains. ? Refined grain products, such as white flour, degermed cornmeal, white bread, and white rice. Shopping  Choose nutrient-rich snacks, such as vegetables, whole fruits, and nuts. Avoid high-calorie and high-sugar snacks, such as potato chips, fruit snacks, and candy.  Use oil-based dressings and spreads on foods instead of solid fats such as butter, stick margarine, or cream cheese.  Limit pre-made sauces, mixes, and "instant" products such as flavored rice, instant noodles, and ready-made pasta.  Try more plant-protein sources, such as tofu, tempeh, black beans, edamame, lentils, nuts, and seeds.  Explore eating plans such as the Mediterranean diet or vegetarian diet. Cooking  Use oil to saut or stir-fry foods instead of solid fats such as butter, stick margarine, or lard.  Try baking, boiling, grilling, or broiling instead of frying.  Remove the fatty part of meats before cooking.  Steam vegetables in water or broth. Meal planning   At meals, imagine dividing your plate into fourths: ? One-half of your plate is fruits and vegetables. ? One-fourth of your plate is whole grains. ? One-fourth of your plate is protein, especially lean meats, poultry, eggs, tofu, beans, or nuts.  Include low-fat dairy as part of your daily diet. Lifestyle  Choose healthy options in all  settings, including home, work, school, restaurants, or stores.  Prepare your food safely: ? Wash your hands after handling raw meats. ? Keep food preparation surfaces clean by regularly washing with hot, soapy water. ? Keep raw meats separate from ready-to-eat foods, such as fruits and vegetables. ? Cook seafood, meat, poultry, and eggs to the recommended internal temperature. ? Store foods at safe temperatures. In general:  Keep cold foods  at 27F (4.4C) or below.  Keep hot foods at 127F (60C) or above.  Keep your freezer at [facility] (-17.8C) or below.  Foods are no longer safe to eat when they have been between the temperatures of 40-127F (4.4-60C) for more than 2 hours. What foods should I eat? Fruits Aim to eat 2 cup-equivalents of fresh, canned (in natural juice), or frozen fruits each day. Examples of 1 cup-equivalent of fruit include 1 small apple, 8 large strawberries, 1 cup canned fruit,  cup dried fruit, or 1 cup 100% juice. Vegetables Aim to eat 2-3 cup-equivalents of fresh and frozen vegetables each day, including different varieties and colors. Examples of 1 cup-equivalent of vegetables include 2 medium carrots, 2 cups raw, leafy greens, 1 cup chopped vegetable (raw or cooked), or 1 medium baked potato. Grains Aim to eat 6 ounce-equivalents of whole grains each day. Examples of 1 ounce-equivalent of grains include 1 slice of bread, 1 cup ready-to-eat cereal, 3 cups popcorn, or  cup cooked rice, pasta, or cereal. Meats and other proteins Aim to eat 5-6 ounce-equivalents of protein each day. Examples of 1 ounce-equivalent of protein include 1 egg, 1/2 cup nuts or seeds, or 1 tablespoon (16 g) peanut butter. A cut of meat or fish that is the size of a deck of cards is about 3-4 ounce-equivalents.  Of the protein you eat each week, try to have at least 8 ounces come from seafood. This includes salmon, trout, herring, and anchovies. Dairy Aim to eat 3 cup-equivalents of  fat-free or low-fat dairy each day. Examples of 1 cup-equivalent of dairy include 1 cup (240 mL) milk, 8 ounces (250 g) yogurt, 1 ounces (44 g) natural cheese, or 1 cup (240 mL) fortified soy milk. Fats and oils  Aim for about 5 teaspoons (21 g) per day. Choose monounsaturated fats, such as canola and olive oils, avocados, peanut butter, and most nuts, or polyunsaturated fats, such as sunflower, corn, and soybean oils, walnuts, pine nuts, sesame seeds, sunflower seeds, and flaxseed. Beverages  Aim for six 8-oz glasses of water per day. Limit coffee to three to five 8-oz cups per day.  Limit caffeinated beverages that have added calories, such as soda and energy drinks.  Limit alcohol intake to no more than 1 drink a day for nonpregnant women and 2 drinks a day for men. One drink equals 12 oz of beer (355 mL), 5 oz of wine (148 mL), or 1 oz of hard liquor (44 mL). Seasoning and other foods  Avoid adding excess amounts of salt to your foods. Try flavoring foods with herbs and spices instead of salt.  Avoid adding sugar to foods.  Try using oil-based dressings, sauces, and spreads instead of solid fats. This information is based on general [place] nutrition guidelines. For more information, visit BuildDNA.es. Exact amounts may vary based on your nutrition needs. Summary  A healthy eating plan may help you to maintain a healthy weight, reduce the risk of chronic diseases, and stay active throughout your life.  Plan your meals. Make sure you eat the right portions of a variety of nutrient-rich foods.  Try baking, boiling, grilling, or broiling instead of frying.  Choose healthy options in all settings, including home, work, school, restaurants, or stores. This information is not intended to replace advice given to you by your health care provider. Make sure you discuss any questions you have with your health care provider. Document Revised: 04/23/2017 Document Reviewed:  04/23/2017 Elsevier Patient Education  2020 Elsevier  Inc.

## 2019-03-12 ENCOUNTER — Telehealth: Payer: Self-pay | Admitting: Adult Health

## 2019-03-12 LAB — CBC WITH DIFFERENTIAL/PLATELET
Basophils Absolute: 0.1 10*3/uL (ref 0.0–0.2)
Basos: 1 %
EOS (ABSOLUTE): 0.1 10*3/uL (ref 0.0–0.4)
Eos: 1 %
Hematocrit: 44.7 % (ref 37.5–51.0)
Hemoglobin: 15.4 g/dL (ref 13.0–17.7)
Immature Grans (Abs): 0.1 10*3/uL (ref 0.0–0.1)
Immature Granulocytes: 1 %
Lymphocytes Absolute: 1.8 10*3/uL (ref 0.7–3.1)
Lymphs: 19 %
MCH: 28.5 pg (ref 26.6–33.0)
MCHC: 34.5 g/dL (ref 31.5–35.7)
MCV: 83 fL (ref 79–97)
Monocytes Absolute: 1.1 10*3/uL — ABNORMAL HIGH (ref 0.1–0.9)
Monocytes: 11 %
Neutrophils Absolute: 6.5 10*3/uL (ref 1.4–7.0)
Neutrophils: 67 %
Platelets: 364 10*3/uL (ref 150–450)
RBC: 5.4 x10E6/uL (ref 4.14–5.80)
RDW: 13.2 % (ref 11.6–15.4)
WBC: 9.6 10*3/uL (ref 3.4–10.8)

## 2019-03-12 LAB — COMP. METABOLIC PANEL (12)
AST: 26 IU/L (ref 0–40)
Albumin/Globulin Ratio: 1.6 (ref 1.2–2.2)
Albumin: 4.3 g/dL (ref 3.8–4.9)
Alkaline Phosphatase: 93 IU/L (ref 39–117)
BUN/Creatinine Ratio: 14 (ref 9–20)
BUN: 11 mg/dL (ref 6–24)
Bilirubin Total: 0.7 mg/dL (ref 0.0–1.2)
Calcium: 9.4 mg/dL (ref 8.7–10.2)
Chloride: 96 mmol/L (ref 96–106)
Creatinine, Ser: 0.78 mg/dL (ref 0.76–1.27)
GFR calc Af Amer: 118 mL/min/{1.73_m2} (ref >59)
GFR calc non Af Amer: 102 mL/min/{1.73_m2} (ref >59)
Globulin, Total: 2.7 g/dL (ref 1.5–4.5)
Glucose: 93 mg/dL (ref 65–99)
Potassium: 5.2 mmol/L (ref 3.5–5.2)
Sodium: 133 mmol/L — ABNORMAL LOW (ref 134–144)
Total Protein: 7 g/dL (ref 6.0–8.5)

## 2019-03-12 LAB — LIPID PANEL
Chol/HDL Ratio: 4.2 ratio (ref 0.0–5.0)
Cholesterol, Total: 151 mg/dL (ref 100–199)
HDL: 36 mg/dL — ABNORMAL LOW (ref >39)
LDL Chol Calc (NIH): 88 mg/dL (ref 0–99)
Triglycerides: 155 mg/dL — ABNORMAL HIGH (ref 0–149)
VLDL Cholesterol Cal: 27 mg/dL (ref 5–40)

## 2019-03-12 LAB — CARDIOVASCULAR RISK ASSESSMENT

## 2019-03-12 LAB — TSH: TSH: 2.44 u[IU]/mL (ref 0.450–4.500)

## 2019-03-12 LAB — HEMOGLOBIN A1C
Est. average glucose Bld gHb Est-mCnc: 128 mg/dL
Hgb A1c MFr Bld: 6.1 % — ABNORMAL HIGH (ref 4.8–5.6)

## 2019-03-12 MED ORDER — MEMANTINE HCL 10 MG PO TABS: 10.0000 mg | ORAL_TABLET | Freq: Two times a day (BID) | ORAL | 3 refills | Status: DC

## 2019-03-12 NOTE — Telephone Encounter (Signed)
Patient father *NOT ON DPR* called to provide information: states the memantine (NAMENDA) 10 MG tablet is needing refills.   [facility] in [place]

## 2019-03-13 NOTE — Assessment & Plan Note (Signed)
Well controlled. Continue current treatment. 

## 2019-03-13 NOTE — Assessment & Plan Note (Addendum)
Causes balance issues and some memory issues. This Is his major source of disability. 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 I do recommend he follows with neurosurgery and neurology regularly at least once a year.  Continue his current medications.

## 2019-03-14 DIAGNOSIS — M25532 Pain in left wrist: Secondary | ICD-10-CM | POA: Diagnosis not present

## 2019-03-16 DIAGNOSIS — Z6841 Body Mass Index (BMI) 40.0 and over, adult: Secondary | ICD-10-CM | POA: Insufficient documentation

## 2019-03-16 NOTE — Assessment & Plan Note (Signed)
Recommend eat healthy and exercise.  Patient would benefit from 60+ pounds weight loss.

## 2019-03-16 NOTE — Assessment & Plan Note (Signed)
Continue current medications.  Well-controlled.  Follows with endocrinology.

## 2019-03-16 NOTE — Assessment & Plan Note (Signed)
Continue sodium chloride supplementation.

## 2019-03-16 NOTE — Assessment & Plan Note (Signed)
See above

## 2019-03-30 ENCOUNTER — Other Ambulatory Visit: Payer: Self-pay | Admitting: Adult Health

## 2019-04-12 ENCOUNTER — Other Ambulatory Visit: Payer: Self-pay | Admitting: Family Medicine

## 2019-04-14 ENCOUNTER — Other Ambulatory Visit: Payer: Self-pay

## 2019-04-21 DIAGNOSIS — M25532 Pain in left wrist: Secondary | ICD-10-CM | POA: Diagnosis not present

## 2019-05-05 ENCOUNTER — Ambulatory Visit: Payer: PPO | Admitting: Adult Health

## 2019-05-05 ENCOUNTER — Encounter: Payer: Self-pay | Admitting: Adult Health

## 2019-05-05 ENCOUNTER — Other Ambulatory Visit: Payer: Self-pay

## 2019-05-05 VITALS — BP 151/90 | HR 76 | Temp 97.4°F | Ht 76.0 in | Wt 365.0 lb

## 2019-05-05 DIAGNOSIS — R569 Unspecified convulsions: Secondary | ICD-10-CM | POA: Diagnosis not present

## 2019-05-05 DIAGNOSIS — R413 Other amnesia: Secondary | ICD-10-CM | POA: Diagnosis not present

## 2019-05-05 MED ORDER — LEVETIRACETAM 1000 MG PO TABS: 1000.0000 mg | ORAL_TABLET | Freq: Two times a day (BID) | ORAL | 3 refills | Status: DC

## 2019-05-05 NOTE — Progress Notes (Signed)
PATIENT: Omarr Sarma Guo DOB: 03-12-1965  REASON FOR VISIT: follow up HISTORY FROM: patient  HISTORY OF PRESENT ILLNESS: Today 05/05/19:  [name] is a 54 year old male with a history of seizures and memory disturbance.  He returns today for follow-up.  He remains on Keppra 1000 mg twice a day.  Denies any seizure events.  He does feel that his memory has improved.  His dad is with him today and reports that he was not taking Namenda consistently.  He is now taking his medication correctly.  He lives at home alone.  Able to complete all ADLs independently.  He no longer operates a Teacher, music.  He returns today for an evaluation.  HISTORY 11/04/18:  [name] is a 54 year old male with a history of seizures and memory disturbance.  He returns today for follow-up.  He remains on Keppra 1000 mg twice a day.  He denies any seizure events.  He does report that his wife passed away approximately 2 weeks ago.  He feels that his memory may be slightly worse.  He is able to complete all ADLs independently.  He does not operate a motor vehicle.  He manages his own finances.  He is able to prepare his own meals.  His dad is with him today.  His diagnosis sometimes he forgets things when I call him.  He returns today for an evaluation.   REVIEW OF SYSTEMS: Out of a complete 14 system review of symptoms, the patient complains only of the following symptoms, and all other reviewed systems are negative.  See HPI  ALLERGIES: Allergies  Allergen Reactions  . Omnipaque [Iohexol] Hives  . Prednisone Nausea And Vomiting and Other (See Comments)    unknown    HOME MEDICATIONS: Outpatient Medications Prior to Visit  Medication Sig Dispense Refill  . aspirin 81 MG tablet Take 81 mg by mouth daily.      . clonazePAM (KLONOPIN) 0.5 MG tablet Take 1 tablet (0.5 mg total) by mouth daily. 30 tablet 2  . fludrocortisone (FLORINEF) 0.1 MG tablet Take 0.2 mg by mouth 2 (two) times daily.     .  hydrocortisone (CORTEF) 10 MG tablet Take 10 mg by mouth daily.    Marland Kitchen levETIRAcetam (KEPPRA) 1000 MG tablet Take 1 tablet by mouth twice daily 180 tablet 0  . lisinopril (ZESTRIL) 10 MG tablet TAKE 1 TABLET BY MOUTH ONCE DAILY. NEED  APPOINTMENT  FOR  FUTURE  REFILLS. 30 tablet 0  . Melatonin 5 MG TABS Take by mouth at bedtime.    . memantine (NAMENDA) 10 MG tablet Take 1 tablet (10 mg total) by mouth 2 (two) times daily. 180 tablet 3  . MYRBETRIQ 25 MG TB24 tablet Take 1 tablet by mouth once daily 90 tablet 1  . pramipexole (MIRAPEX) 0.125 MG tablet Take 1 tablet by mouth twice daily 180 tablet 0  . Sod Fluoride-Potassium Nitrate 1.1-5 % PSTE Place 1 application onto teeth 2 (two) times daily.    . SODIUM CHLORIDE PO Take 1,000 mg by mouth in the morning, at noon, and at bedtime.    Marland Kitchen venlafaxine XR (EFFEXOR-XR) 75 MG 24 hr capsule Take 3 capsules (225 mg total) by mouth daily. 270 capsule 1   No facility-administered medications prior to visit.    PAST MEDICAL HISTORY: Past Medical History:  Diagnosis Date  . Anxiety   . 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 Atrial fibrillation ([place])   . Atrial fibrillation ([place])   .  Cardiomyopathy   . Carpal tunnel syndrome   . Carpal tunnel syndrome   . Chiari I malformation ([place])   . Coronary artery disease   . Depression   . Headache(784.0)   . Hydrocephalus ([place])   . Hydrocephalus ([place])   . Hypertension   . Memory loss   . Obstructive sleep apnea   . Pulmonary embolism ([place])   . Renal calculi   . Restless leg syndrome   . Salt-wasting syndrome of infancy   . Seizure ([place])   . Seizures ([place])   . SIADH (syndrome of inappropriate ADH production) ([place])   . SIADH (syndrome of inappropriate ADH production) ([place])   . Syncope    Recurrent, thought to be neurally mediated    PAST SURGICAL HISTORY: Past Surgical History:  Procedure Laterality Date  . CHOLECYSTECTOMY    . LOOP RECORDER EXPLANT N/A 10/30/2012   Procedure: LOOP RECORDER EXPLANT;   Surgeon: Deboraha Sprang, MD;  Location: [facility] CATH LAB;  Service: Cardiovascular;  Laterality: N/A;  . Nissen Fundoplasty    . PROSTATE SURGERY    . St. Jude DM [phone] loop     S/p loop recorder  . VENTRICULOPERITONEAL SHUNT Bilateral     FAMILY HISTORY: Family History  Problem Relation Age of Onset  . Migraines Mother   . CAD Mother   . Hypertension Mother   . Endometriosis Sister   . Seizures Neg Hx     SOCIAL HISTORY: Social History   Socioeconomic History  . Marital status: Married    Spouse name: [name]  . Number of children: 0  . Years of education: 54  . Highest education level: Not on file  Occupational History  . Occupation: CUSTOMER SERVICE    Employer: [employer]: Not working   Tobacco Use  . Smoking status: Never Smoker  . Smokeless tobacco: Never Used  Substance and Sexual Activity  . Alcohol use: No    Alcohol/week: 0.0 standard drinks  . Drug use: No  . Sexual activity: Not on file  Other Topics Concern  . Not on file  Social History Narrative   Patient is married [name]), no children   Patient is right handed   Education level is high school graduate   Caffeine consumption is 1/2 of 2L daily   Social Determinants of Health   Financial Resource Strain:   . Difficulty of Paying Living Expenses:   Food Insecurity:   . Worried About Charity fundraiser in the Last Year:   . Arboriculturist in the Last Year:   Transportation Needs:   . Film/video editor (Medical):   Marland Kitchen Lack of Transportation (Non-Medical):   Physical Activity:   . Days of Exercise per Week:   . Minutes of Exercise per Session:   Stress:   . Feeling of Stress :   Social Connections:   . Frequency of Communication with Friends and Family:   . Frequency of Social Gatherings with Friends and Family:   . Attends Religious Services:   . Active Member of Clubs or Organizations:   . Attends Archivist Meetings:   Marland Kitchen Marital Status:   Intimate Partner  Violence:   . Fear of Current or Ex-Partner:   . Emotionally Abused:   Marland Kitchen Physically Abused:   . Sexually Abused:       PHYSICAL EXAM  Vitals:   05/05/19 1351  BP: (!) 151/90  Pulse: 76  Temp: (!) 97.4 F (36.3  C)  Weight: (!) 365 lb (165.6 kg)  Height: 6\' 4"  (1.93 m)   Body mass index is 44.43 kg/m.   MMSE - Mini Mental State Exam 05/05/2019 11/04/2018 07/12/2017  Not completed: - (No Data) -  Orientation to time 5 3 5   Orientation to Place 5 5 4   Registration 3 3 3   Attention/ Calculation 5 5 5   Recall 2 0 1  Language- name 2 objects 2 2 2   Language- repeat 1 1 1   Language- follow 3 step command 3 3 3   Language- read & follow direction 1 1 1   Write a sentence 1 1 1   Copy design 0 1 1  Copy design-comments 25 animals - -  Total score 28 25 27      Generalized: Well developed, in no acute distress   Neurological examination  Mentation: Alert oriented to time, place, history taking. Follows all commands speech and language fluent Cranial nerve II-XII: Pupils were equal round reactive to light. Extraocular movements were full, visual field were full on confrontational test.  Head turning and shoulder shrug  were normal and symmetric. Motor: The motor testing reveals 5 over 5 strength of all 4 extremities. Good symmetric motor tone is noted throughout.  Sensory: Sensory testing is intact to soft touch on all 4 extremities. No evidence of extinction is noted.  Coordination: Cerebellar testing reveals good finger-nose-finger and heel-to-shin bilaterally.  Gait and station: Gait is normal.  Reflexes: Deep tendon reflexes are symmetric and normal bilaterally.   DIAGNOSTIC DATA (LABS, IMAGING, TESTING) - I reviewed patient records, labs, notes, testing and imaging myself where available.  Lab Results  Component Value Date   WBC 9.6 03/11/2019   HGB 15.4 03/11/2019   HCT 44.7 03/11/2019   MCV 83 03/11/2019   PLT 364 03/11/2019      Component Value Date/Time   NA  133 (L) 03/11/2019 1228   K 5.2 03/11/2019 1228   CL 96 03/11/2019 1228   CO2 27 02/16/2011 2056   GLUCOSE 93 03/11/2019 1228   GLUCOSE 88 08/07/2011 0052   BUN 11 03/11/2019 1228   CREATININE 0.78 03/11/2019 1228   CALCIUM 9.4 03/11/2019 1228   PROT 7.0 03/11/2019 1228   ALBUMIN 4.3 03/11/2019 1228   AST 26 03/11/2019 1228   ALT 38 02/16/2011 2056   ALKPHOS 93 03/11/2019 1228   BILITOT 0.7 03/11/2019 1228   GFRNONAA 102 03/11/2019 1228   GFRAA 118 03/11/2019 1228   Lab Results  Component Value Date   CHOL 151 03/11/2019   HDL 36 (L) 03/11/2019   LDLCALC 88 03/11/2019   TRIG 155 (H) 03/11/2019   CHOLHDL 4.2 03/11/2019   Lab Results  Component Value Date   HGBA1C 6.1 (H) 03/11/2019   Lab Results  Component Value Date   VITAMINB12 640 01/05/2016   Lab Results  Component Value Date   TSH 2.440 03/11/2019      ASSESSMENT AND PLAN 54 y.o. 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[place]), Atrial fibrillation ([place]), Atrial fibrillation ([place]), Cardiomyopathy, Carpal tunnel syndrome, Carpal tunnel syndrome, Chiari I malformation ([place]), Coronary artery disease, Depression, Headache(784.0), Hydrocephalus ([place]), Hydrocephalus ([place]), Hypertension, Memory loss, Obstructive sleep apnea, Pulmonary embolism ([place]), Renal calculi, Restless leg syndrome, Salt-wasting syndrome of infancy, Seizure ([place]), Seizures ([place]), SIADH (syndrome of inappropriate ADH production) ([place]), SIADH (syndrome of inappropriate ADH production) ([place]), and Syncope. here with:  1.  Seizures  -Continue Keppra 1000 mg twice a day  2.  Memory disturbance  -MMSE has improved 28/30 -Continue Namenda 10 mg twice a day  Advised if symptoms worsen or he develop new symptoms he should let us know.  Follow-up in 1 year or sooner if needed.   I spent 23 minutes of face-to-face and non-face-to-face time with patient.  This included previsit chart review, lab review, study review, order  entry, electronic health record documentation, patient education.  Ward Givens, MSN, NP-C 05/05/2019, 2:02 PM [facility] [address] [place], [place] [address] [phone]

## 2019-05-05 NOTE — Patient Instructions (Signed)
Your Plan:  Continue [place] If your symptoms worsen or you develop new symptoms please let us know.     Thank you for coming to see [place] at [facility]. I hope we have been able to provide you high quality care today.  You may receive a patient satisfaction survey over the next few weeks. We would appreciate your feedback and comments so that we may continue to improve ourselves and the health of our patients.

## 2019-05-05 NOTE — Progress Notes (Signed)
I have read the note, and I agree with the clinical assessment and plan.  Kodah Maret K Wilkin Lippy   

## 2019-05-07 DIAGNOSIS — Z0271 Encounter for disability determination: Secondary | ICD-10-CM

## 2019-05-12 ENCOUNTER — Other Ambulatory Visit: Payer: Self-pay | Admitting: Family Medicine

## 2019-05-15 ENCOUNTER — Telehealth: Payer: Self-pay | Admitting: Family Medicine

## 2019-05-15 DIAGNOSIS — E871 Hypo-osmolality and hyponatremia: Secondary | ICD-10-CM | POA: Diagnosis not present

## 2019-05-15 DIAGNOSIS — E291 Testicular hypofunction: Secondary | ICD-10-CM | POA: Diagnosis not present

## 2019-05-15 DIAGNOSIS — R7303 Prediabetes: Secondary | ICD-10-CM | POA: Diagnosis not present

## 2019-05-15 DIAGNOSIS — E2740 Unspecified adrenocortical insufficiency: Secondary | ICD-10-CM | POA: Diagnosis not present

## 2019-05-15 NOTE — Progress Notes (Signed)
  Chronic Care Management   Outreach Note  05/15/2019 Name: [name] MRN: [id number] DOB: 1965/12/29  Referred by: Rochel Brome, MD Reason for referral : No chief complaint on file.   An unsuccessful telephone outreach was attempted today. The patient was referred to the pharmacist for assistance with care management and care coordination.   Follow Up Plan:   Earney [place] Upstream Scheduler

## 2019-06-10 ENCOUNTER — Encounter: Payer: Self-pay | Admitting: Family Medicine
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
## 2019-06-13 ENCOUNTER — Encounter: Payer: Self-pay | Admitting: Family Medicine

## 2019-06-13 ENCOUNTER — Other Ambulatory Visit: Payer: Self-pay

## 2019-06-13 ENCOUNTER — Ambulatory Visit (INDEPENDENT_AMBULATORY_CARE_PROVIDER_SITE_OTHER): Payer: PPO | Admitting: Family Medicine

## 2019-06-13 VITALS — BP 138/86 | HR 77 | Temp 97.0°F | Ht 77.0 in | Wt 370.0 lb

## 2019-06-13 DIAGNOSIS — I10 Essential (primary) hypertension: Secondary | ICD-10-CM | POA: Insufficient documentation

## 2019-06-13 DIAGNOSIS — R7303 Prediabetes: Secondary | ICD-10-CM

## 2019-06-13 DIAGNOSIS — Q039 Congenital hydrocephalus, unspecified: Secondary | ICD-10-CM | POA: Diagnosis not present

## 2019-06-13 DIAGNOSIS — R569 Unspecified convulsions: Secondary | ICD-10-CM | POA: Diagnosis not present

## 2019-06-13 DIAGNOSIS — E2740 Unspecified adrenocortical insufficiency: Secondary | ICD-10-CM | POA: Diagnosis not present

## 2019-06-13 DIAGNOSIS — Z6841 Body Mass Index (BMI) 40.0 and over, adult: Secondary | ICD-10-CM

## 2019-06-13 NOTE — Patient Instructions (Signed)
Work on weight loss.  Go to [facility].  See you at [facility]!

## 2019-06-14 LAB — CBC WITH DIFFERENTIAL/PLATELET
Basophils Absolute: 0.1 10*3/uL (ref 0.0–0.2)
Basos: 1 %
EOS (ABSOLUTE): 0.1 10*3/uL (ref 0.0–0.4)
Eos: 2 %
Hematocrit: 45.5 % (ref 37.5–51.0)
Hemoglobin: 15.3 g/dL (ref 13.0–17.7)
Immature Grans (Abs): 0.1 10*3/uL (ref 0.0–0.1)
Immature Granulocytes: 1 %
Lymphocytes Absolute: 1.9 10*3/uL (ref 0.7–3.1)
Lymphs: 20 %
MCH: 28.4 pg (ref 26.6–33.0)
MCHC: 33.6 g/dL (ref 31.5–35.7)
MCV: 85 fL (ref 79–97)
Monocytes Absolute: 1 10*3/uL — ABNORMAL HIGH (ref 0.1–0.9)
Monocytes: 10 %
Neutrophils Absolute: 6.4 10*3/uL (ref 1.4–7.0)
Neutrophils: 66 %
Platelets: 389 10*3/uL (ref 150–450)
RBC: 5.38 x10E6/uL (ref 4.14–5.80)
RDW: 13.1 % (ref 11.6–15.4)
WBC: 9.5 10*3/uL (ref 3.4–10.8)

## 2019-06-14 LAB — COMPREHENSIVE METABOLIC PANEL
ALT: 26 IU/L (ref 0–44)
AST: 26 IU/L (ref 0–40)
Albumin/Globulin Ratio: 1.2 (ref 1.2–2.2)
Albumin: 3.7 g/dL — ABNORMAL LOW (ref 3.8–4.9)
Alkaline Phosphatase: 82 IU/L (ref 48–121)
BUN/Creatinine Ratio: 12 (ref 9–20)
BUN: 11 mg/dL (ref 6–24)
Bilirubin Total: 0.5 mg/dL (ref 0.0–1.2)
CO2: 21 mmol/L (ref 20–29)
Calcium: 9.3 mg/dL (ref 8.7–10.2)
Chloride: 99 mmol/L (ref 96–106)
Creatinine, Ser: 0.91 mg/dL (ref 0.76–1.27)
GFR calc Af Amer: 110 mL/min/{1.73_m2} (ref >59)
GFR calc non Af Amer: 95 mL/min/{1.73_m2} (ref >59)
Globulin, Total: 3.2 g/dL (ref 1.5–4.5)
Glucose: 101 mg/dL — ABNORMAL HIGH (ref 65–99)
Potassium: 4.7 mmol/L (ref 3.5–5.2)
Sodium: 134 mmol/L (ref 134–144)
Total Protein: 6.9 g/dL (ref 6.0–8.5)

## 2019-06-14 LAB — LIPID PANEL
Chol/HDL Ratio: 3.9 ratio (ref 0.0–5.0)
Cholesterol, Total: 143 mg/dL (ref 100–199)
HDL: 37 mg/dL — ABNORMAL LOW (ref >39)
LDL Chol Calc (NIH): 79 mg/dL (ref 0–99)
Triglycerides: 152 mg/dL — ABNORMAL HIGH (ref 0–149)
VLDL Cholesterol Cal: 27 mg/dL (ref 5–40)

## 2019-06-14 LAB — CARDIOVASCULAR RISK ASSESSMENT

## 2019-06-14 LAB — HEMOGLOBIN A1C
Est. average glucose Bld gHb Est-mCnc: 128 mg/dL
Hgb A1c MFr Bld: 6.1 % — ABNORMAL HIGH (ref 4.8–5.6)

## 2019-06-15 ENCOUNTER — Other Ambulatory Visit: Payer: Self-pay | Admitting: Adult Health

## 2019-07-09 DIAGNOSIS — M19072 Primary osteoarthritis, left ankle and foot: Secondary | ICD-10-CM | POA: Diagnosis not present

## 2019-07-14 ENCOUNTER — Other Ambulatory Visit: Payer: Self-pay | Admitting: Adult Health

## 2019-07-17 ENCOUNTER — Other Ambulatory Visit: Payer: Self-pay | Admitting: Family Medicine

## 2019-07-17 DIAGNOSIS — F320 Major depressive disorder, single episode, mild: Secondary | ICD-10-CM

## 2019-08-10 ENCOUNTER — Other Ambulatory Visit: Payer: Self-pay | Admitting: Family Medicine

## 2019-08-13 ENCOUNTER — Ambulatory Visit (INDEPENDENT_AMBULATORY_CARE_PROVIDER_SITE_OTHER): Payer: PPO | Admitting: Family Medicine

## 2019-08-13 ENCOUNTER — Other Ambulatory Visit: Payer: Self-pay

## 2019-08-13 ENCOUNTER — Encounter: Payer: Self-pay | Admitting: Family Medicine

## 2019-08-13 VITALS — BP 148/90 | HR 73 | Temp 97.6°F | Ht 77.0 in | Wt 365.0 lb

## 2019-08-13 DIAGNOSIS — Z0000 Encounter for general adult medical examination without abnormal findings: Secondary | ICD-10-CM

## 2019-08-13 NOTE — Progress Notes (Signed)
Subjective:  Patient ID: [name], male    DOB: 11-03-1965  Age: 54 y.o. MRN: [id number]  Chief Complaint  Patient presents with  . Annual Exam    Welcome to Medicare    HPI  Patient's last physical exam was over 1 year ago .  Weight: Is not Appropriate for height (BMI less than 27%) ;  Blood Pressure: Is not Normal (BP less than 120/80) ;  Medical History: Patient history reviewed ; Family history reviewed ;  Allergies Reviewed: No change in current allergies ;  Medications Reviewed: Medications reviewed - no changes ;  Lipids: Normal lipid levels ;  Smoking: Life-long non-smoker ;  Physical Activity: Exercises at least 7 times per week ;  Alcohol/Drug Use: Is a non-drinker ; No illicit drug use ;  Patient is not afflicted from Stress Incontinence and Urge Incontinence  Safety: reviewed ; Patient wears a seat belt, has smoke detectors, has carbon monoxide detectors, practices appropriate gun safety, and does not wear sunscreen with extended sun exposure. Dental Care: biannual cleanings, brushes and flosses daily. Ophthalmology/Optometry: Does not go annually   Hearing loss: none Vision impairments: none  Fall Risk  08/13/2019  Falls in the past year? 1  Number falls in past yr: 0  Injury with Fall? 0  Follow up Falls prevention discussed;Falls evaluation completed     Depression screen [facility] 2/9 08/13/2019 06/13/2019  Decreased Interest 0 2  Down, Depressed, Hopeless 1 1  PHQ - 2 Score 1 3  Altered sleeping 0 0  Tired, decreased energy 1 1  Change in appetite 3 0  Feeling bad or failure about yourself  0 0  Trouble concentrating 0 0  Moving slowly or fidgety/restless 0 0  Suicidal thoughts 0 0  PHQ-9 Score 5 4  Difficult doing work/chores Not difficult at all Not difficult at all     Functional Status Survey: Is the patient deaf or have difficulty hearing?: No Does the patient have difficulty seeing, even when wearing glasses/contacts?: No Does the patient  have difficulty concentrating, remembering, or making decisions?: No Does the patient have difficulty walking or climbing stairs?: No Does the patient have difficulty dressing or bathing?: No Does the patient have difficulty doing errands alone such as visiting a doctor's office or shopping?: Yes (Father accompaines)   Social Hx  Wife died of breast CA-pt now on disability Social History   Socioeconomic History  . Marital status: Married    Spouse name: [name]  . Number of children: 0  . Years of education: 15  . Highest education level: Not on file  Occupational History  . Occupation: CUSTOMER SERVICE    Employer: [employer] [place]: Not working   Tobacco Use  . Smoking status: Never Smoker  . Smokeless tobacco: Never Used  Substance and Sexual Activity  . Alcohol use: No    Alcohol/week: 0.0 standard drinks  . Drug use: No  . Sexual activity: Not on file  Other Topics Concern  . Not on file  Social History Narrative   Patient is married [name]), no children   Patient is right handed   Education level is high school graduate   Caffeine consumption is 1/2 of 2L daily   Social Determinants of Health   Financial Resource Strain:   . Difficulty of Paying Living Expenses:   Food Insecurity:   . Worried About Charity fundraiser in the Last Year:   . [place] in the  Last Year:   Transportation Needs:   . Film/video editor (Medical):   Marland Kitchen Lack of Transportation (Non-Medical):   Physical Activity:   . Days of Exercise per Week:   . Minutes of Exercise per Session:   Stress:   . Feeling of Stress :   Social Connections:   . Frequency of Communication with Friends and Family:   . Frequency of Social Gatherings with Friends and Family:   . Attends Religious Services:   . Active Member of Clubs or Organizations:   . Attends Archivist Meetings:   Marland Kitchen Marital Status:    Past Medical History:  Diagnosis Date  . Anxiety   . 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 Atrial fibrillation ([place])   . Cardiomyopathy   . Carpal tunnel syndrome   . Chiari I malformation ([place])   . Coronary artery disease   . Depression   . Headache(784.0)   . Hydrocephalus ([place])   . Hypertension   . Memory loss   . Obstructive sleep apnea   . Pulmonary embolism ([place])   . Renal calculi   . Restless leg syndrome   . Salt-wasting syndrome of infancy   . Seizure ([place])   . SIADH (syndrome of inappropriate ADH production) ([place])   . Syncope    Recurrent, thought to be neurally mediated   Family History  Problem Relation Age of Onset  . Migraines Mother   . CAD Mother   . Hypertension Mother   . Endometriosis Sister   . Seizures Neg Hx     Review of Systems   Objective:  BP (!) 148/90   Pulse 73   Temp 97.6 F (36.4 C)   Ht 6\' 5"  (1.956 m)   Wt (!) 365 lb (165.6 kg)   SpO2 98%   BMI 43.28 kg/m   BP/Weight 08/13/2019 06/13/2019 1/88/4166  Systolic BP 063 016 010  Diastolic BP 90 86 90  Wt. (Lbs) 365 370 365  BMI 43.28 43.88 44.43   Lab Results  Component Value Date   WBC 9.5 06/13/2019   HGB 15.3 06/13/2019   HCT 45.5 06/13/2019   PLT 389 06/13/2019   GLUCOSE 101 (H) 06/13/2019   CHOL 143 06/13/2019   TRIG 152 (H) 06/13/2019   HDL 37 (L) 06/13/2019   LDLCALC 79 06/13/2019   ALT 26 06/13/2019   AST 26 06/13/2019   NA 134 06/13/2019   K 4.7 06/13/2019   CL 99 06/13/2019   CREATININE 0.91 06/13/2019   BUN 11 06/13/2019   CO2 21 06/13/2019   TSH 2.440 03/11/2019   INR 1.02 08/07/2011   HGBA1C 6.1 (H) 06/13/2019      Assessment & Plan:  There are no diagnoses linked to this encounter.   No orders of the defined types were placed in this encounter.   These are the goals we discussed: Goals    . Weight (lb) < 300 lb (136.1 kg)     Patient states he would like to lose weight.        Health Maintenance  Topic Date Due  .  Hepatitis C: One time screening is recommended by Center for Disease Control  (CDC) for  adults born  from [birth year] through 1966.   Never done  . Tetanus Vaccine  Never done  . Colon Cancer Screening  Never done  . Flu Shot  08/24/2019  . COVID-19 Vaccine  Completed  . HIV Screening  Completed   pt declined eye exam  and ECG 5/21-labwork reviewed  AN INDIVIDUALIZED CARE PLAN: was established or reinforced today.   SELF MANAGEMENT: The patient and I together assessed ways to personally work towards obtaining the recommended goals  Support needs The patient and/or family needs were assessed and services were offered and not necessary at this time.    Follow-up: [place] [phone]

## 2019-08-13 NOTE — Patient Instructions (Addendum)
Use Klonopin 1/2 tablet -put in separate pill boxes for as needed medication-pt 's wife recently died  Consider signing a living will /healthcare power of attorney-dad will assist at bank  Continue follow up: Dr. Ronnald Ramp, Dr [name] and Dr. Dory Larsen Consider counseling -loss of wife and medical illness a concern

## 2019-09-20 ENCOUNTER — Telehealth: Payer: Self-pay | Admitting: Adult Health

## 2019-09-25 ENCOUNTER — Telehealth: Payer: Self-pay | Admitting: Adult Health

## 2019-09-25 MED ORDER — LEVETIRACETAM 1000 MG PO TABS: 1000.0000 mg | ORAL_TABLET | Freq: Two times a day (BID) | ORAL | 1 refills | Status: DC

## 2019-09-25 NOTE — Telephone Encounter (Signed)
Pt has called to discuss the refused refill on his levETIRAcetam (KEPPRA) 1000 MG tablet.  Pt states this medication was last filled on 04-03-19 with no refills, please call.

## 2019-09-25 NOTE — Telephone Encounter (Signed)
Spoke with patient. He stated that he had received a message from [facility] that the refill for Keppra had been denied. He was confused because he was told to follow up in a year back in April. Reviewed his chart, it appears the refill was denied because it was a different pharmacy from the April visit. Patient explained that he recently switched from [facility] to [place] and [facility] refused to transfer this RX to [place].   New RX has been sent to [facility] in [place] with 1 refill. Patient is aware of this.   Nothing further needed at time of call.

## 2019-09-25 NOTE — Addendum Note (Signed)
Addended by: Valerie Salts on: 09/25/2019 02:40 PM   Modules accepted: Orders

## 2019-09-25 NOTE — Telephone Encounter (Signed)
error 

## 2019-10-05 ENCOUNTER — Other Ambulatory Visit: Payer: Self-pay | Admitting: Adult Health

## 2019-10-20 NOTE — Progress Notes (Signed)
Subjective:  Patient ID: [name], male    DOB: [birth year]  Age: 54 y.o. MRN: [id number]  Chief Complaint  Patient presents with  . Hypertension   Hypertension This is a chronic problem. The current episode started more than 1 year ago. The problem is controlled. Pertinent negatives include no chest pain, headaches or shortness of breath. There are no associated agents to hypertension. Compliance problems include diet and exercise.    Depression: Patient is well controlled on venlafaxine and clonazepam.  Denies sadness or anhedonia.  Hydrocephalus: Patient has seen neurology and neurosurgery..  He has a shunt placed.  He has a history of seizures has been well controlled on Keppra.  He has not had a seizure for over a year.  He also does have restless leg syndrome which responds well to pramipexole 0.125 mg 1 daily at night.  He does have memory issues which are being treated with Namenda.  Adrenal insufficiency: Patient is currently on Florinef and Cortef for the treatment of adrenal insufficiency.  He has been stable for some time now.  His hyponatremia is treated with sodium chloride 1000 mg 3 times a day.  Prediabetes: Patient tries to eat healthy but overeats.  Morbid obesity: Patient tries to eat healthy but overeats.  He has gained weight since his last visit.   Seizures: no seizures.   RLS: taking mirapex.  This helps his restless legs.  Social Hx   Social History   Socioeconomic History  . Marital status: Married    Spouse name: [name]  . Number of children: 0  . Years of education: 41  . Highest education level: Not on file  Occupational History  . Occupation: CUSTOMER SERVICE    Employer: [employer]: Not working   Tobacco Use  . Smoking status: Never Smoker  . Smokeless tobacco: Never Used  Substance and Sexual Activity  . Alcohol use: No    Alcohol/week: 0.0 standard drinks  . Drug use: No  . Sexual activity: Not on file  Other Topics  Concern  . Not on file  Social History Narrative   Patient is married [name]), no children   Patient is right handed   Education level is high school graduate   Caffeine consumption is 1/2 of 2L daily   Social Determinants of Health   Financial Resource Strain:   . Difficulty of Paying Living Expenses: Not on file  Food Insecurity:   . Worried About Charity fundraiser in the Last Year: Not on file  . Ran Out of Food in the Last Year: Not on file  Transportation Needs:   . Lack of Transportation (Medical): Not on file  . Lack of Transportation (Non-Medical): Not on file  Physical Activity:   . Days of Exercise per Week: Not on file  . Minutes of Exercise per Session: Not on file  Stress:   . Feeling of Stress : Not on file  Social Connections:   . Frequency of Communication with Friends and Family: Not on file  . Frequency of Social Gatherings with Friends and Family: Not on file  . Attends Religious Services: Not on file  . Active Member of Clubs or Organizations: Not on file  . Attends Archivist Meetings: Not on file  . Marital Status: Not on file   Past Medical History:  Diagnosis Date  . Anxiety   . 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 Atrial fibrillation ([place])   . Cardiomyopathy   .  Carpal tunnel syndrome   . Chiari I malformation ([place])   . Coronary artery disease   . Depression   . Headache(784.0)   . Hydrocephalus ([place])   . Hypertension   . Memory loss   . Obstructive sleep apnea   . Pulmonary embolism ([place])   . Renal calculi   . Restless leg syndrome   . Salt-wasting syndrome of infancy   . Seizure ([place])   . SIADH (syndrome of inappropriate ADH production) ([place])   . Syncope    Recurrent, thought to be neurally mediated   Family History  Problem Relation Age of Onset  . Migraines Mother   . CAD Mother   . Hypertension Mother   . Endometriosis Sister   . Seizures Neg Hx    Review of Systems  Constitutional: Negative for chills, fatigue and  fever.  HENT: Negative for congestion, ear pain and sore throat.   Eyes: Negative for visual disturbance.  Respiratory: Negative for cough and shortness of breath.   Cardiovascular: Negative for chest pain.  Gastrointestinal: Negative for abdominal pain, constipation, diarrhea, nausea and vomiting.  Endocrine: Positive for polydipsia and polyphagia. Negative for polyuria.  Genitourinary: Negative for dysuria and frequency.  Musculoskeletal: Positive for arthralgias. Negative for myalgias.  Neurological: Negative for dizziness, seizures, syncope and headaches.  Psychiatric/Behavioral: Positive for confusion. Negative for dysphoric mood and sleep disturbance.       No dysphoria     Objective:  BP (!) 148/88   Pulse 76   Temp 97.8 F (36.6 C)   Resp 18   Ht 6\' 5"  (1.956 m)   Wt (!) 373 lb (169.2 kg)   BMI 44.23 kg/m   BP/Weight 10/21/2019 08/13/2019 0/27/2536  Systolic BP 644 034 742  Diastolic BP 88 90 86  Wt. (Lbs) 373 365 370  BMI 44.23 43.28 43.88    Physical Exam Vitals reviewed.  Constitutional:      Appearance: Normal appearance. He is obese.  Cardiovascular:     Rate and Rhythm: Normal rate and regular rhythm.     Pulses: Normal pulses.     Heart sounds: Normal heart sounds.  Pulmonary:     Effort: Pulmonary effort is normal.     Breath sounds: Normal breath sounds.  Abdominal:     General: Bowel sounds are normal.     Palpations: Abdomen is soft.     Tenderness: There is no abdominal tenderness.  Neurological:     Mental Status: He is alert.  Psychiatric:        Mood and Affect: Mood normal.        Behavior: Behavior normal.     Lab Results  Component Value Date   WBC 9.5 06/13/2019   HGB 15.3 06/13/2019   HCT 45.5 06/13/2019   PLT 389 06/13/2019   GLUCOSE 101 (H) 06/13/2019   CHOL 143 06/13/2019   TRIG 152 (H) 06/13/2019   HDL 37 (L) 06/13/2019   LDLCALC 79 06/13/2019   ALT 26 06/13/2019   AST 26 06/13/2019   NA 134 06/13/2019   K 4.7  06/13/2019   CL 99 06/13/2019   CREATININE 0.91 06/13/2019   BUN 11 06/13/2019   CO2 21 06/13/2019   TSH 2.440 03/11/2019   INR 1.02 08/07/2011   HGBA1C 6.1 (H) 06/13/2019      Assessment & Plan:  1. Prediabetes - Hemoglobin A1c  2. Essential hypertension Not at goal.  Increase lisinopril to 20 mg once daily. DASH diet given. - CBC  with Differential/Platelet - Comprehensive metabolic panel  3. Morbid obesity ([place]) Recommend eat healthy.  Recommend small portion size 6 times a day.  Use a small plate.  Recommend avoid refined sweets and white bread.  Avoid fried foods.  4. BMI 40.0-44.9, adult ([place])  5. Adrenal insufficiency (HCC) Continue current medications.  Well-controlled  6. Congenital hydrocephalus (HCC) Continue current medications.  Well-controlled Is Mr. Mallie Mussel want his 7. Seizures (HCC) Continue current medications.    8. Mild recurrent major depression (HCC) Increase clonazepam to 0.5 mg once daily.  9. RLS (restless legs syndrome) Continue current medications.  Well-controlled.  10. Other hyperlipidemia Low HDL and elevated triglycerides.  Consistent with metabolic syndrome. Low-fat diet.  Consider addition of cholesterol medicine. - Lipid panel  11. Encounter for immunization - Flu Vaccine MDCK QUAD PF     Follow-up: Return in about 4 weeks (around 11/18/2019) for hypertension..  A CLINICAL SUMMARY including a written plan identify barriers to care unique to individual due to social or financial issues and help create solutions together. and a patient's and the patient's families understanding of their medical issues and care needs   [place] [phone]

## 2019-10-21 ENCOUNTER — Encounter: Payer: Self-pay | Admitting: Family Medicine

## 2019-10-21 ENCOUNTER — Ambulatory Visit (INDEPENDENT_AMBULATORY_CARE_PROVIDER_SITE_OTHER): Payer: PPO | Admitting: Family Medicine

## 2019-10-21 ENCOUNTER — Other Ambulatory Visit: Payer: Self-pay

## 2019-10-21 VITALS — BP 148/88 | HR 76 | Temp 97.8°F | Resp 18 | Ht 77.0 in | Wt 373.0 lb

## 2019-10-21 DIAGNOSIS — R569 Unspecified convulsions: Secondary | ICD-10-CM | POA: Diagnosis not present

## 2019-10-21 DIAGNOSIS — R7303 Prediabetes: Secondary | ICD-10-CM

## 2019-10-21 DIAGNOSIS — F330 Major depressive disorder, recurrent, mild: Secondary | ICD-10-CM | POA: Diagnosis not present

## 2019-10-21 DIAGNOSIS — E7849 Other hyperlipidemia: Secondary | ICD-10-CM

## 2019-10-21 DIAGNOSIS — Z6841 Body Mass Index (BMI) 40.0 and over, adult: Secondary | ICD-10-CM

## 2019-10-21 DIAGNOSIS — Z23 Encounter for immunization: Secondary | ICD-10-CM

## 2019-10-21 DIAGNOSIS — I10 Essential (primary) hypertension: Secondary | ICD-10-CM

## 2019-10-21 DIAGNOSIS — G2581 Restless legs syndrome: Secondary | ICD-10-CM | POA: Diagnosis not present

## 2019-10-21 DIAGNOSIS — E2740 Unspecified adrenocortical insufficiency: Secondary | ICD-10-CM | POA: Diagnosis not present

## 2019-10-21 DIAGNOSIS — Q039 Congenital hydrocephalus, unspecified: Secondary | ICD-10-CM | POA: Diagnosis not present

## 2019-10-21 MED ORDER — CLONAZEPAM 0.5 MG PO TABS: 0.5000 mg | ORAL_TABLET | Freq: Every day | ORAL | 2 refills | Status: DC

## 2019-10-21 NOTE — Patient Instructions (Addendum)
Hypertension: increase lisinopril to 20 mg once daily. Depression/Anxiety: Start clonazepam once daily. Recommend continue to work on eating healthy diet and exercise.  DASH Eating Plan/Diabetic diet  DASH stands for "Dietary Approaches to Stop Hypertension." The DASH eating plan is a healthy eating plan that has been shown to reduce high blood pressure (hypertension). It may also reduce your risk for type 2 diabetes, heart disease, and stroke. The DASH eating plan may also help with weight loss. What are tips for following this plan?  General guidelines  Avoid eating more than 2,300 mg (milligrams) of salt (sodium) a day. If you have hypertension, you may need to reduce your sodium intake to 1,500 mg a day.  Limit alcohol intake to no more than 1 drink a day for nonpregnant women and 2 drinks a day for men. One drink equals 12 oz of beer, 5 oz of wine, or 1 oz of hard liquor.  Avoid sweets. Eat fruit instead. Eat whole wheat bread. Avoid potatoes and pasta.   Work with your health care provider to maintain a healthy body weight or to lose weight. Ask what an ideal weight is for you.  Get at least 30 minutes of exercise that causes your heart to beat faster (aerobic exercise) most days of the week. Activities may include walking, swimming, or biking.  Work with your health care provider or diet and nutrition specialist (dietitian) to adjust your eating plan to your individual calorie needs.   Reading food labels   Check food labels for the amount of sodium per serving. Choose foods with less than 5 percent of the Daily Value of sodium. Generally, foods with less than 300 mg of sodium per serving fit into this eating plan.  To find whole grains, look for the word "whole" as the first word in the ingredient list. Shopping  Buy products labeled as "low-sodium" or "no salt added."  Buy fresh foods. Avoid canned foods and premade or frozen meals. Cooking  Avoid adding salt when  cooking. Use salt-free seasonings or herbs instead of table salt or sea salt. Check with your health care provider or pharmacist before using salt substitutes.  Do not fry foods. Cook foods using healthy methods such as baking, boiling, grilling, and broiling instead.  Cook with heart-healthy oils, such as olive, canola, soybean, or sunflower oil. Meal planning  Eat a balanced diet that includes: ? 5 or more servings of fruits and vegetables each day. At each meal, try to fill half of your plate with fruits and vegetables. ? Up to 6-8 servings of whole grains each day. ? Less than 6 oz of lean meat, poultry, or fish each day. A 3-oz serving of meat is about the same size as a deck of cards. One egg equals 1 oz. ? 2 servings of low-fat dairy each day. ? A serving of nuts, seeds, or beans 5 times each week. ? Heart-healthy fats. Healthy fats called Omega-3 fatty acids are found in foods such as flaxseeds and coldwater fish, like sardines, salmon, and mackerel.  Limit how much you eat of the following: ? Canned or prepackaged foods. ? Food that is high in trans fat, such as fried foods. ? Food that is high in saturated fat, such as fatty meat. ? Sweets, desserts, sugary drinks, and other foods with added sugar. ? Full-fat dairy products.  Do not salt foods before eating.  Try to eat at least 2 vegetarian meals each week.  Eat more home-cooked food and less  restaurant, buffet, and fast food.  When eating at a restaurant, ask that your food be prepared with less salt or no salt, if possible. What foods are recommended? The items listed may not be a complete list. Talk with your dietitian about what dietary choices are best for you. Grains Whole-grain or whole-wheat bread. Whole-grain or whole-wheat pasta. Brown rice. [place]. Bulgur. Whole-grain and low-sodium cereals. Pita bread. Low-fat, low-sodium crackers. Whole-wheat flour tortillas. Vegetables Fresh or frozen vegetables  (raw, steamed, roasted, or grilled). Low-sodium or reduced-sodium tomato and vegetable juice. Low-sodium or reduced-sodium tomato sauce and tomato paste. Low-sodium or reduced-sodium canned vegetables. Fruits All fresh, dried, or frozen fruit. Canned fruit in natural juice (without added sugar). Meat and other protein foods Skinless chicken or [place]. Ground chicken or [place]. Pork with fat trimmed off. Fish and seafood. Egg whites. Dried beans, peas, or lentils. Unsalted nuts, nut butters, and seeds. Unsalted canned beans. Lean cuts of beef with fat trimmed off. Low-sodium, lean deli meat. Dairy Low-fat (1%) or fat-free (skim) milk. Fat-free, low-fat, or reduced-fat cheeses. Nonfat, low-sodium ricotta or cottage cheese. Low-fat or nonfat yogurt. Low-fat, low-sodium cheese. Fats and oils Soft margarine without trans fats. Vegetable oil. Low-fat, reduced-fat, or light mayonnaise and salad dressings (reduced-sodium). Canola, safflower, olive, soybean, and sunflower oils. Avocado. Seasoning and other foods Herbs. Spices. Seasoning mixes without salt. Unsalted popcorn and pretzels. Fat-free sweets. What foods are not recommended? The items listed may not be a complete list. Talk with your dietitian about what dietary choices are best for you. Grains Baked goods made with fat, such as croissants, muffins, or some breads. Dry pasta or rice meal packs. Vegetables Creamed or fried vegetables. Vegetables in a cheese sauce. Regular canned vegetables (not low-sodium or reduced-sodium). Regular canned tomato sauce and paste (not low-sodium or reduced-sodium). Regular tomato and vegetable juice (not low-sodium or reduced-sodium). Angie Fava. Olives. Fruits Canned fruit in a light or heavy syrup. Fried fruit. Fruit in cream or butter sauce. Meat and other protein foods Fatty cuts of meat. Ribs. Fried meat. Berniece Salines. Sausage. Bologna and other processed lunch meats. Salami. Fatback. Hotdogs. Bratwurst. Salted nuts  and seeds. Canned beans with added salt. Canned or smoked fish. Whole eggs or egg yolks. Chicken or [place] with skin. Dairy Whole or 2% milk, cream, and half-and-half. Whole or full-fat cream cheese. Whole-fat or sweetened yogurt. Full-fat cheese. Nondairy creamers. Whipped toppings. Processed cheese and cheese spreads. Fats and oils Butter. Stick margarine. Lard. Shortening. Ghee. Bacon fat. Tropical oils, such as coconut, palm kernel, or palm oil. Seasoning and other foods Salted popcorn and pretzels. Onion salt, garlic salt, seasoned salt, table salt, and sea salt. Worcestershire sauce. Tartar sauce. Barbecue sauce. Teriyaki sauce. Soy sauce, including reduced-sodium. Steak sauce. Canned and packaged gravies. Fish sauce. Oyster sauce. Cocktail sauce. Horseradish that you find on the shelf. Ketchup. Mustard. Meat flavorings and tenderizers. Bouillon cubes. Hot sauce and Tabasco sauce. Premade or packaged marinades. Premade or packaged taco seasonings. Relishes. Regular salad dressings. Where to find more information:  National Heart, Lung, and [place]: https://wilson-eaton.com/  American Heart Association: www.heart.org Summary  The DASH eating plan is a healthy eating plan that has been shown to reduce high blood pressure (hypertension). It may also reduce your risk for type 2 diabetes, heart disease, and stroke.  With the DASH eating plan, you should limit salt (sodium) intake to 2,300 mg a day. If you have hypertension, you may need to reduce your sodium intake to 1,500 mg a day.  When on the DASH eating plan, aim to eat more fresh fruits and vegetables, whole grains, lean proteins, low-fat dairy, and heart-healthy fats.  Work with your health care provider or diet and nutrition specialist (dietitian) to adjust your eating plan to your individual calorie needs. This information is not intended to replace advice given to you by your health care provider. Make sure you discuss any questions  you have with your health care provider. Document Revised: 12/22/2016 Document Reviewed: 01/03/2016 Elsevier Patient Education  2020 [employer].

## 2019-10-22 LAB — CBC WITH DIFFERENTIAL/PLATELET
Basophils Absolute: 0.1 10*3/uL (ref 0.0–0.2)
Basos: 1 %
EOS (ABSOLUTE): 0.2 10*3/uL (ref 0.0–0.4)
Eos: 2 %
Hematocrit: 44.5 % (ref 37.5–51.0)
Hemoglobin: 14.7 g/dL (ref 13.0–17.7)
Immature Grans (Abs): 0.1 10*3/uL (ref 0.0–0.1)
Immature Granulocytes: 1 %
Lymphocytes Absolute: 2 10*3/uL (ref 0.7–3.1)
Lymphs: 22 %
MCH: 27.7 pg (ref 26.6–33.0)
MCHC: 33 g/dL (ref 31.5–35.7)
MCV: 84 fL (ref 79–97)
Monocytes Absolute: 0.9 10*3/uL (ref 0.1–0.9)
Monocytes: 10 %
Neutrophils Absolute: 5.8 10*3/uL (ref 1.4–7.0)
Neutrophils: 64 %
Platelets: 372 10*3/uL (ref 150–450)
RBC: 5.3 x10E6/uL (ref 4.14–5.80)
RDW: 13.4 % (ref 11.6–15.4)
WBC: 9.1 10*3/uL (ref 3.4–10.8)

## 2019-10-22 LAB — COMPREHENSIVE METABOLIC PANEL
ALT: 23 IU/L (ref 0–44)
AST: 22 IU/L (ref 0–40)
Albumin/Globulin Ratio: 1.4 (ref 1.2–2.2)
Albumin: 4 g/dL (ref 3.8–4.9)
Alkaline Phosphatase: 82 IU/L (ref 44–121)
BUN/Creatinine Ratio: 11 (ref 9–20)
BUN: 9 mg/dL (ref 6–24)
Bilirubin Total: 0.5 mg/dL (ref 0.0–1.2)
CO2: 22 mmol/L (ref 20–29)
Calcium: 9.2 mg/dL (ref 8.7–10.2)
Chloride: 100 mmol/L (ref 96–106)
Creatinine, Ser: 0.79 mg/dL (ref 0.76–1.27)
GFR calc Af Amer: 118 mL/min/{1.73_m2} (ref >59)
GFR calc non Af Amer: 102 mL/min/{1.73_m2} (ref >59)
Globulin, Total: 2.9 g/dL (ref 1.5–4.5)
Glucose: 99 mg/dL (ref 65–99)
Potassium: 4.7 mmol/L (ref 3.5–5.2)
Sodium: 135 mmol/L (ref 134–144)
Total Protein: 6.9 g/dL (ref 6.0–8.5)

## 2019-10-22 LAB — CARDIOVASCULAR RISK ASSESSMENT

## 2019-10-22 LAB — LIPID PANEL
Chol/HDL Ratio: 3.9 ratio (ref 0.0–5.0)
Cholesterol, Total: 148 mg/dL (ref 100–199)
HDL: 38 mg/dL — ABNORMAL LOW (ref >39)
LDL Chol Calc (NIH): 85 mg/dL (ref 0–99)
Triglycerides: 142 mg/dL (ref 0–149)
VLDL Cholesterol Cal: 25 mg/dL (ref 5–40)

## 2019-10-22 LAB — HEMOGLOBIN A1C
Est. average glucose Bld gHb Est-mCnc: 137 mg/dL
Hgb A1c MFr Bld: 6.4 % — ABNORMAL HIGH (ref 4.8–5.6)

## 2019-11-08 ENCOUNTER — Other Ambulatory Visit: Payer: Self-pay | Admitting: Family Medicine

## 2019-11-08 DIAGNOSIS — F330 Major depressive disorder, recurrent, mild: Secondary | ICD-10-CM

## 2019-11-18 ENCOUNTER — Other Ambulatory Visit: Payer: Self-pay

## 2019-11-18 ENCOUNTER — Ambulatory Visit (INDEPENDENT_AMBULATORY_CARE_PROVIDER_SITE_OTHER): Payer: PPO | Admitting: Family Medicine

## 2019-11-18 VITALS — BP 134/86 | HR 78 | Temp 97.5°F | Resp 18 | Ht 77.0 in | Wt 372.0 lb

## 2019-11-18 DIAGNOSIS — F330 Major depressive disorder, recurrent, mild: Secondary | ICD-10-CM | POA: Diagnosis not present

## 2019-11-18 DIAGNOSIS — I10 Essential (primary) hypertension: Secondary | ICD-10-CM | POA: Diagnosis not present

## 2019-11-18 DIAGNOSIS — Z23 Encounter for immunization: Secondary | ICD-10-CM | POA: Diagnosis not present

## 2019-11-18 NOTE — Patient Instructions (Signed)
Pharmacy: [facility]  Vaccine (2 shots) Tdap (Tetanus/pertussis.)

## 2019-11-18 NOTE — Progress Notes (Signed)
Subjective:  Patient ID: [name], male    DOB: [birth year]  Age: 54 y.o. MRN: [id number]  Chief Complaint  Patient presents with  . Hypertension   HPI  Hypertension: Patient is doing well on increased dose of lisinopril for total of 20 mg daily.  He has not had any chest pain, shortness of breath, blurry vision, or headaches. He does not know when his blood pressure is up by his symptoms.  He has not been checking his blood pressure.  He has been working on eating healthier.  He goes to the [facility] twice a week with his father.  Depression with anxiety: Increase his clonazepam at his last appointment to 0.5 mg once daily.  Continue Effexor ER 75 mg 3 in a.m.  Doing well.   Current Outpatient Medications on File Prior to Visit  Medication Sig Dispense Refill  . aspirin 81 MG tablet Take 81 mg by mouth daily.      . clonazePAM (KLONOPIN) 0.5 MG tablet Take 1 tablet by mouth once daily 30 tablet 1  . fludrocortisone (FLORINEF) 0.1 MG tablet Take 0.2 mg by mouth 2 (two) times daily.     . hydrocortisone (CORTEF) 10 MG tablet Take 10 mg by mouth daily.    Marland Kitchen levETIRAcetam (KEPPRA) 1000 MG tablet Take 1 tablet (1,000 mg total) by mouth 2 (two) times daily. 180 tablet 1  . lisinopril (ZESTRIL) 10 MG tablet TAKE 1 TABLET BY MOUTH ONCE DAILY . APPOINTMENT REQUIRED FOR FUTURE REFILLS (Patient taking differently: Take 20 mg by mouth in the morning and at bedtime. ) 90 tablet 2  . Melatonin 5 MG TABS Take by mouth at bedtime.    . memantine (NAMENDA) 10 MG tablet Take 1 tablet (10 mg total) by mouth 2 (two) times daily. 180 tablet 3  . MYRBETRIQ 25 MG TB24 tablet Take 1 tablet by mouth once daily 90 tablet 0  . pramipexole (MIRAPEX) 0.125 MG tablet Take 1 tablet by mouth twice daily 180 tablet 0  . Sod Fluoride-Potassium Nitrate 1.1-5 % PSTE Place 1 application onto teeth 2 (two) times daily.    . SODIUM CHLORIDE PO Take 1,000 mg by mouth in the morning, at noon, and at bedtime.    Marland Kitchen  venlafaxine XR (EFFEXOR-XR) 75 MG 24 hr capsule TAKE 3 CAPSULES BY MOUTH ONCE DAILY 270 capsule 0   No current facility-administered medications on file prior to visit.   Past Medical History:  Diagnosis Date  . Anxiety   . 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 Atrial fibrillation ([place])   . Cardiomyopathy   . Carpal tunnel syndrome   . Chiari I malformation ([place])   . Coronary artery disease   . Depression   . Headache(784.0)   . Hydrocephalus ([place])   . Hypertension   . Memory loss   . Obstructive sleep apnea   . Pulmonary embolism ([place])   . Renal calculi   . Restless leg syndrome   . Salt-wasting syndrome of infancy   . Seizure ([place])   . SIADH (syndrome of inappropriate ADH production) ([place])   . Syncope    Recurrent, thought to be neurally mediated   Past Surgical History:  Procedure Laterality Date  . CHOLECYSTECTOMY    . LOOP RECORDER EXPLANT N/A 10/30/2012   Procedure: LOOP RECORDER EXPLANT;  Surgeon: Deboraha Sprang, MD;  Location: [facility] CATH LAB;  Service: Cardiovascular;  Laterality: N/A;  . Nissen Fundoplasty    . PROSTATE SURGERY    .  St. Jude DM [phone] loop     S/p loop recorder  . VENTRICULOPERITONEAL SHUNT Bilateral     Family History  Problem Relation Age of Onset  . Migraines Mother   . CAD Mother   . Hypertension Mother   . Endometriosis Sister   . Seizures Neg Hx    Social History   Socioeconomic History  . Marital status: Married    Spouse name: [name]  . Number of children: 0  . Years of education: 72  . Highest education level: Not on file  Occupational History  . Occupation: CUSTOMER SERVICE    Employer: [employer]: Not working   Tobacco Use  . Smoking status: Never Smoker  . Smokeless tobacco: Never Used  Substance and Sexual Activity  . Alcohol use: No    Alcohol/week: 0.0 standard drinks  . Drug use: No  . Sexual activity: Not on file  Other Topics Concern  . Not on file  Social History Narrative   Patient is married  [name]), no children   Patient is right handed   Education level is high school graduate   Caffeine consumption is 1/2 of 2L daily   Social Determinants of Health   Financial Resource Strain:   . Difficulty of Paying Living Expenses: Not on file  Food Insecurity:   . Worried About Charity fundraiser in the Last Year: Not on file  . Ran Out of Food in the Last Year: Not on file  Transportation Needs:   . Lack of Transportation (Medical): Not on file  . Lack of Transportation (Non-Medical): Not on file  Physical Activity:   . Days of Exercise per Week: Not on file  . Minutes of Exercise per Session: Not on file  Stress:   . Feeling of Stress : Not on file  Social Connections:   . Frequency of Communication with Friends and Family: Not on file  . Frequency of Social Gatherings with Friends and Family: Not on file  . Attends Religious Services: Not on file  . Active Member of Clubs or Organizations: Not on file  . Attends Archivist Meetings: Not on file  . Marital Status: Not on file    Review of Systems  Constitutional: Negative for chills, fatigue and fever.  HENT: Negative for congestion, ear pain and sore throat.   Eyes: Negative for visual disturbance.  Respiratory: Negative for cough and shortness of breath.   Cardiovascular: Negative for chest pain and leg swelling.  Gastrointestinal: Negative for abdominal pain, constipation, diarrhea, nausea and vomiting.  Endocrine: Negative for polydipsia, polyphagia and polyuria.  Genitourinary: Negative for dysuria and frequency.  Musculoskeletal: Negative for arthralgias and myalgias.  Neurological: Negative for dizziness and headaches.  Psychiatric/Behavioral: Negative for dysphoric mood.       No dysphoria     Objective:  BP 134/86   Pulse 78   Temp (!) 97.5 F (36.4 C)   Resp 18   Ht 6\' 5"  (1.956 m)   Wt (!) 372 lb (168.7 kg)   BMI 44.11 kg/m   BP/Weight 11/18/2019 10/21/2019 07/22/4763  Systolic BP  465 035 465  Diastolic BP 86 88 90  Wt. (Lbs) 372 373 365  BMI 44.11 44.23 43.28    Physical Exam Constitutional:      Appearance: Normal appearance. He is obese.  Cardiovascular:     Rate and Rhythm: Normal rate and regular rhythm.     Heart sounds: Normal heart sounds.  Pulmonary:  Effort: Pulmonary effort is normal.     Breath sounds: Normal breath sounds.  Neurological:     Mental Status: He is alert.  Psychiatric:        Mood and Affect: Mood normal.        Behavior: Behavior normal.     Diabetic Foot Exam - Simple   No data filed       Lab Results  Component Value Date   WBC 9.1 10/21/2019   HGB 14.7 10/21/2019   HCT 44.5 10/21/2019   PLT 372 10/21/2019   GLUCOSE 99 10/21/2019   CHOL 148 10/21/2019   TRIG 142 10/21/2019   HDL 38 (L) 10/21/2019   LDLCALC 85 10/21/2019   ALT 23 10/21/2019   AST 22 10/21/2019   NA 135 10/21/2019   K 4.7 10/21/2019   CL 100 10/21/2019   CREATININE 0.79 10/21/2019   BUN 9 10/21/2019   CO2 22 10/21/2019   TSH 2.440 03/11/2019   INR 1.02 08/07/2011   HGBA1C 6.4 (H) 10/21/2019      Assessment & Plan:   1. Essential hypertension Well controlled.  No changes to medicines.  Continue to work on eating a healthy diet and exercise.  Labs drawn today.  - Comprehensive metabolic panel  2. Need for vaccination for Strep pneumoniae - Pneumococcal polysaccharide vaccine 23-valent greater than or equal to 2yo subcutaneous/IM  3. Mild recurrent major depression ([place]) The current medical regimen is effective;  continue present plan and medications.    Orders Placed This Encounter  Procedures  . Pneumococcal polysaccharide vaccine 23-valent greater than or equal to 2yo subcutaneous/IM  . Comprehensive metabolic panel     Follow-up: Return in about 3 months (around 02/18/2020) for fasting.Marland Kitchen  An After Visit Summary was printed and given to the patient.  Rochel Brome [facility] [phone]

## 2019-11-19 LAB — COMPREHENSIVE METABOLIC PANEL
ALT: 24 IU/L (ref 0–44)
AST: 18 IU/L (ref 0–40)
Albumin/Globulin Ratio: 1.2 (ref 1.2–2.2)
Albumin: 4 g/dL (ref 3.8–4.9)
Alkaline Phosphatase: 89 IU/L (ref 44–121)
BUN/Creatinine Ratio: 14 (ref 9–20)
BUN: 10 mg/dL (ref 6–24)
Bilirubin Total: 0.6 mg/dL (ref 0.0–1.2)
CO2: 24 mmol/L (ref 20–29)
Calcium: 9.4 mg/dL (ref 8.7–10.2)
Chloride: 97 mmol/L (ref 96–106)
Creatinine, Ser: 0.74 mg/dL — ABNORMAL LOW (ref 0.76–1.27)
GFR calc Af Amer: 121 mL/min/{1.73_m2} (ref >59)
GFR calc non Af Amer: 105 mL/min/{1.73_m2} (ref >59)
Globulin, Total: 3.3 g/dL (ref 1.5–4.5)
Glucose: 97 mg/dL (ref 65–99)
Potassium: 4.7 mmol/L (ref 3.5–5.2)
Sodium: 134 mmol/L (ref 134–144)
Total Protein: 7.3 g/dL (ref 6.0–8.5)

## 2019-11-27 ENCOUNTER — Encounter: Payer: Self-pay | Admitting: Family Medicine

## 2019-11-30 ENCOUNTER — Other Ambulatory Visit: Payer: Self-pay | Admitting: Neurology

## 2019-12-24 ENCOUNTER — Other Ambulatory Visit: Payer: Self-pay | Admitting: Adult Health

## 2020-01-07 ENCOUNTER — Encounter: Payer: Self-pay | Admitting: Family Medicine

## 2020-01-11 ENCOUNTER — Other Ambulatory Visit: Payer: Self-pay | Admitting: Adult Health

## 2020-01-24 ENCOUNTER — Other Ambulatory Visit: Payer: Self-pay | Admitting: Physician Assistant

## 2020-01-24 DIAGNOSIS — F320 Major depressive disorder, single episode, mild: Secondary | ICD-10-CM

## 2020-01-27 ENCOUNTER — Other Ambulatory Visit: Payer: Self-pay | Admitting: Physician Assistant

## 2020-02-19 ENCOUNTER — Ambulatory Visit (INDEPENDENT_AMBULATORY_CARE_PROVIDER_SITE_OTHER): Payer: PPO | Admitting: Family Medicine

## 2020-02-19 ENCOUNTER — Encounter: Payer: Self-pay | Admitting: Family Medicine

## 2020-02-19 ENCOUNTER — Other Ambulatory Visit: Payer: Self-pay

## 2020-02-19 VITALS — BP 158/98 | HR 80 | Temp 98.1°F | Resp 18 | Ht 77.0 in | Wt 362.0 lb

## 2020-02-19 DIAGNOSIS — N39498 Other specified urinary incontinence: Secondary | ICD-10-CM

## 2020-02-19 DIAGNOSIS — Q039 Congenital hydrocephalus, unspecified: Secondary | ICD-10-CM | POA: Diagnosis not present

## 2020-02-19 DIAGNOSIS — I10 Essential (primary) hypertension: Secondary | ICD-10-CM

## 2020-02-19 DIAGNOSIS — E2740 Unspecified adrenocortical insufficiency: Secondary | ICD-10-CM

## 2020-02-19 DIAGNOSIS — G40909 Epilepsy, unspecified, not intractable, without status epilepticus: Secondary | ICD-10-CM

## 2020-02-19 DIAGNOSIS — G2581 Restless legs syndrome: Secondary | ICD-10-CM | POA: Diagnosis not present

## 2020-02-19 DIAGNOSIS — Z6841 Body Mass Index (BMI) 40.0 and over, adult: Secondary | ICD-10-CM | POA: Diagnosis not present

## 2020-02-19 DIAGNOSIS — E7849 Other hyperlipidemia: Secondary | ICD-10-CM | POA: Diagnosis not present

## 2020-02-19 DIAGNOSIS — R7303 Prediabetes: Secondary | ICD-10-CM

## 2020-02-19 MED ORDER — LISINOPRIL 20 MG PO TABS: 20.0000 mg | ORAL_TABLET | Freq: Every day | ORAL | 3 refills | Status: DC

## 2020-02-19 NOTE — Progress Notes (Signed)
Subjective:  Patient ID: [name], male    DOB: 11-Nov-1965  Age: 55 y.o. MRN: [id number]  Chief Complaint  Patient presents with  . Hypertension    HPI Hypertension: Currently on zestril 10 mg once daily.  Adrenal insufficiency: On florinef and cortef.  Seizures: On Keppra.  Morbid obesity: Has lost 10 lbs. Is overeating. Low to medium exercise  Depression with Anxiety: On venlafaxine and clonazepam.  Hyponatremia: On sodium chloride 1 gm one three times a day.  Congenital hydrocephalus: Memory loss. Currently on namenda.  Incontinence: on myrebetriq.  Atrial fibrillation with CAD. History of pulmonary embolism. On aspirin 81 mg once daily.   Current Outpatient Medications on File Prior to Visit  Medication Sig Dispense Refill  . aspirin 81 MG tablet Take 81 mg by mouth daily.      . clonazePAM (KLONOPIN) 0.5 MG tablet Take 1 tablet by mouth once daily 30 tablet 1  . fludrocortisone (FLORINEF) 0.1 MG tablet Take 0.2 mg by mouth 2 (two) times daily.     . hydrocortisone (CORTEF) 10 MG tablet Take 10 mg by mouth daily.    Marland Kitchen levETIRAcetam (KEPPRA) 1000 MG tablet Take 1 tablet (1,000 mg total) by mouth 2 (two) times daily. 180 tablet 1  . Melatonin 5 MG TABS Take by mouth at bedtime.    . memantine (NAMENDA) 10 MG tablet Take 1 tablet (10 mg total) by mouth 2 (two) times daily. 180 tablet 3  . MYRBETRIQ 25 MG TB24 tablet Take 1 tablet by mouth once daily 90 tablet 3  . pramipexole (MIRAPEX) 0.125 MG tablet Take 1 tablet by mouth twice daily 180 tablet 1  . Sod Fluoride-Potassium Nitrate 1.1-5 % PSTE Place 1 application onto teeth 2 (two) times daily.    . SODIUM CHLORIDE PO Take 1,000 mg by mouth in the morning, at noon, and at bedtime.    Marland Kitchen venlafaxine XR (EFFEXOR-XR) 75 MG 24 hr capsule TAKE 3 CAPSULES BY MOUTH ONCE DAILY 270 capsule 0   No current facility-administered medications on file prior to visit.   Past Medical History:  Diagnosis Date  . Anxiety   .  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 Atrial fibrillation ([place])   . Cardiomyopathy   . Carpal tunnel syndrome   . Chiari I malformation ([place])   . Coronary artery disease   . Depression   . Headache(784.0)   . Hydrocephalus ([place])   . Hypertension   . Memory loss   . Obstructive sleep apnea   . Pulmonary embolism ([place])   . Renal calculi   . Restless leg syndrome   . Salt-wasting syndrome of infancy   . Seizure ([place])   . SIADH (syndrome of inappropriate ADH production) ([place])   . Syncope    Recurrent, thought to be neurally mediated   Past Surgical History:  Procedure Laterality Date  . CHOLECYSTECTOMY    . LOOP RECORDER EXPLANT N/A 10/30/2012   Procedure: LOOP RECORDER EXPLANT;  Surgeon: Deboraha Sprang, MD;  Location: [facility] CATH LAB;  Service: Cardiovascular;  Laterality: N/A;  . Nissen Fundoplasty    . PROSTATE SURGERY    . St. Jude DM [phone] loop     S/p loop recorder  . VENTRICULOPERITONEAL SHUNT Bilateral     Family History  Problem Relation Age of Onset  . Migraines Mother   . CAD Mother   . Hypertension Mother   . Endometriosis Sister   . Seizures Neg Hx    Social History  Socioeconomic History  . Marital status: Married    Spouse name: [name]  . Number of children: 0  . Years of education: 20  . Highest education level: Not on file  Occupational History  . Occupation: CUSTOMER SERVICE    Employer: [employer]: Not working   Tobacco Use  . Smoking status: Never Smoker  . Smokeless tobacco: Never Used  Substance and Sexual Activity  . Alcohol use: No    Alcohol/week: 0.0 standard drinks  . Drug use: No  . Sexual activity: Not on file  Other Topics Concern  . Not on file  Social History Narrative   Patient is married [name]), no children   Patient is right handed   Education level is high school graduate   Caffeine consumption is 1/2 of 2L daily   Social Determinants of Health   Financial Resource Strain: Not on file  Food Insecurity: Not on  file  Transportation Needs: Not on file  Physical Activity: Not on file  Stress: Not on file  Social Connections: Not on file    Review of Systems  Constitutional: Negative for chills and fever.  HENT: Negative for congestion, rhinorrhea and sore throat.   Respiratory: Negative for cough and shortness of breath.   Cardiovascular: Negative for chest pain and palpitations.  Gastrointestinal: Negative for abdominal pain, constipation, diarrhea, nausea and vomiting.  Endocrine: Positive for polyphagia and polyuria.  Genitourinary: Negative for dysuria and urgency.  Musculoskeletal: Negative for arthralgias, back pain and myalgias.  Neurological: Negative for dizziness and headaches.  Psychiatric/Behavioral: Negative for dysphoric mood. The patient is not nervous/anxious.      Objective:  BP (!) 158/98   Pulse 80   Temp 98.1 F (36.7 C)   Resp 18   Ht 6\' 5"  (1.956 m)   Wt (!) 362 lb (164.2 kg)   BMI 42.93 kg/m   BP/Weight 02/19/2020 11/18/2019 1/61/0960  Systolic BP 454 098 119  Diastolic BP 98 86 88  Wt. (Lbs) 362 372 373  BMI 42.93 44.11 44.23    Physical Exam Vitals reviewed.  Constitutional:      Appearance: Normal appearance. He is obese.  Neck:     Vascular: No carotid bruit.  Cardiovascular:     Rate and Rhythm: Normal rate and regular rhythm.     Pulses: Normal pulses.     Heart sounds: Normal heart sounds.  Pulmonary:     Effort: Pulmonary effort is normal.     Breath sounds: Normal breath sounds. No wheezing, rhonchi or rales.  Abdominal:     General: Bowel sounds are normal.     Palpations: Abdomen is soft.     Tenderness: There is no abdominal tenderness.  Neurological:     Mental Status: He is alert.  Psychiatric:        Mood and Affect: Mood normal.        Behavior: Behavior normal.     Lab Results  Component Value Date   WBC 9.1 10/21/2019   HGB 14.7 10/21/2019   HCT 44.5 10/21/2019   PLT 372 10/21/2019   GLUCOSE 97 11/18/2019   CHOL 148  10/21/2019   TRIG 142 10/21/2019   HDL 38 (L) 10/21/2019   LDLCALC 85 10/21/2019   ALT 24 11/18/2019   AST 18 11/18/2019   NA 134 11/18/2019   K 4.7 11/18/2019   CL 97 11/18/2019   CREATININE 0.74 (L) 11/18/2019   BUN 10 11/18/2019   CO2 24 11/18/2019  TSH 2.440 03/11/2019   INR 1.02 08/07/2011   HGBA1C 6.4 (H) 10/21/2019      Assessment & Plan:   1. Essential hypertension Increase zestril to 20 mg once daily. Follow up in 1 month. - CBC with Differential/Platelet - Comprehensive metabolic panel - lisinopril (ZESTRIL) 20 MG tablet; Take 1 tablet (20 mg total) by mouth daily.  Dispense: 90 tablet; Refill: 3  2. Prediabetes - Hemoglobin A1c  3. Morbid obesity (HCC) Low fat diet, low sugar diet and exercise recommended.  4. Congenital hydrocephalus ([place]) Shunt in place. Sees neurology.   5. Adrenal insufficiency (HCC) The current medical regimen is effective;  continue present plan and medications.  6. RLS (restless legs syndrome) The current medical regimen is effective;  continue present plan and medications.  7. Other hyperlipidemia Low fat diet and exercise.  - Lipid panel  8. BMI 40.0-44.9, adult ([place]) See above.  9. Seizure disorder ([place]) The current medical regimen is effective;  continue present plan and medications. Continue to follow up with neurology.   10. Other urinary incontinence  The current medical regimen is effective;  continue present plan and medications.  Meds ordered this encounter  Medications  . lisinopril (ZESTRIL) 20 MG tablet    Sig: Take 1 tablet (20 mg total) by mouth daily.    Dispense:  90 tablet    Refill:  3    Orders Placed This Encounter  Procedures  . CBC with Differential/Platelet  . Comprehensive metabolic panel  . Hemoglobin A1c  . Lipid panel     Follow-up: Return in about 4 weeks (around 03/18/2020) for for hypertension.  An After Visit Summary was printed and given to the patient.  Rochel Brome,  MD [facility] [phone]

## 2020-02-19 NOTE — Patient Instructions (Signed)
Increase lisinopril to 20 mg once daily.   Prediabetes Eating Plan Prediabetes is a condition that causes blood sugar (glucose) levels to be higher than normal. This increases the risk for developing type 2 diabetes (type 2 diabetes mellitus). Working with a health care provider or nutrition specialist (dietitian) to make diet and lifestyle changes can help prevent the onset of diabetes. These changes may help you:  Control your blood glucose levels.  Improve your cholesterol levels.  Manage your blood pressure. What are tips for following this plan? Reading food labels  Read food labels to check the amount of fat, salt (sodium), and sugar in prepackaged foods. Avoid foods that have: ? Saturated fats. ? Trans fats. ? Added sugars.  Avoid foods that have more than 300 milligrams (mg) of sodium per serving. Limit your sodium intake to less than 2,300 mg each day. Shopping  Avoid buying pre-made and processed foods.  Avoid buying drinks with added sugar. Cooking  Cook with olive oil. Do not use butter, lard, or ghee.  Bake, broil, grill, steam, or boil foods. Avoid frying. Meal planning  Work with your dietitian to create an eating plan that is right for you. This may include tracking how many calories you take in each day. Use a food diary, notebook, or mobile application to track what you eat at each meal.  Consider following a Mediterranean diet. This includes: ? Eating several servings of fresh fruits and vegetables each day. ? Eating fish at least twice a week. ? Eating one serving each day of whole grains, beans, nuts, and seeds. ? Using olive oil instead of other fats. ? Limiting alcohol. ? Limiting red meat. ? Using nonfat or low-fat dairy products.  Consider following a plant-based diet. This includes dietary choices that focus on eating mostly vegetables and fruit, grains, beans, nuts, and seeds.  If you have high blood pressure, you may need to limit your sodium  intake or follow a diet such as the DASH (Dietary Approaches to Stop Hypertension) eating plan. The DASH diet aims to lower high blood pressure.   Lifestyle  Set weight loss goals with help from your health care team. It is recommended that most people with prediabetes lose 7% of their body weight.  Exercise for at least 30 minutes 5 or more days a week.  Attend a support group or seek support from a mental health counselor.  Take over-the-counter and prescription medicines only as told by your health care provider. What foods are recommended? Fruits Berries. Bananas. Apples. Oranges. Grapes. Papaya. Mango. Pomegranate. Kiwi. Grapefruit. Cherries. Vegetables Lettuce. Spinach. Peas. Beets. Cauliflower. Cabbage. Broccoli. Carrots. Tomatoes. Squash. Eggplant. Herbs. Peppers. Onions. Cucumbers. Brussels sprouts. Grains Whole grains, such as whole-wheat or whole-grain breads, crackers, cereals, and pasta. Unsweetened oatmeal. Bulgur. Barley. Quinoa. Brown rice. Corn or whole-wheat flour tortillas or taco shells. Meats and other proteins Seafood. Poultry without skin. Lean cuts of pork and beef. Tofu. Eggs. Nuts. Beans. Dairy Low-fat or fat-free dairy products, such as yogurt, cottage cheese, and cheese. Beverages Water. Tea. Coffee. Sugar-free or diet soda. Seltzer water. Low-fat or nonfat milk. Milk alternatives, such as soy or almond milk. Fats and oils Olive oil. Canola oil. Sunflower oil. Grapeseed oil. Avocado. Walnuts. Sweets and desserts Sugar-free or low-fat pudding. Sugar-free or low-fat ice cream and other frozen treats. Seasonings and condiments Herbs. Sodium-free spices. Mustard. Relish. Low-salt, low-sugar ketchup. Low-salt, low-sugar barbecue sauce. Low-fat or fat-free mayonnaise. The items listed above may not be a complete list of  recommended foods and beverages. Contact a dietitian for more information. What foods are not recommended? Fruits Fruits canned with  syrup. Vegetables Canned vegetables. Frozen vegetables with butter or cream sauce. Grains Refined white flour and flour products, such as bread, pasta, snack foods, and cereals. Meats and other proteins Fatty cuts of meat. Poultry with skin. Breaded or fried meat. Processed meats. Dairy Full-fat yogurt, cheese, or milk. Beverages Sweetened drinks, such as iced tea and soda. Fats and oils Butter. Lard. Ghee. Sweets and desserts Baked goods, such as cake, cupcakes, pastries, cookies, and cheesecake. Seasonings and condiments Spice mixes with added salt. Ketchup. Barbecue sauce. Mayonnaise. The items listed above may not be a complete list of foods and beverages that are not recommended. Contact a dietitian for more information. Where to find more information  American Diabetes Association: www.diabetes.org Summary  You may need to make diet and lifestyle changes to help prevent the onset of diabetes. These changes can help you control blood sugar, improve cholesterol levels, and manage blood pressure.  Set weight loss goals with help from your health care team. It is recommended that most people with prediabetes lose 7% of their body weight.  Consider following a Mediterranean diet. This includes eating plenty of fresh fruits and vegetables, whole grains, beans, nuts, seeds, fish, and low-fat dairy, and using olive oil instead of other fats. This information is not intended to replace advice given to you by your health care provider. Make sure you discuss any questions you have with your health care provider. Document Revised: 04/10/2019 Document Reviewed: 04/10/2019 Elsevier Patient Education  [place].

## 2020-02-20 LAB — CBC WITH DIFFERENTIAL/PLATELET
Basophils Absolute: 0.1 10*3/uL (ref 0.0–0.2)
Basos: 1 %
EOS (ABSOLUTE): 0.1 10*3/uL (ref 0.0–0.4)
Eos: 1 %
Hematocrit: 46.4 % (ref 37.5–51.0)
Hemoglobin: 15.7 g/dL (ref 13.0–17.7)
Immature Grans (Abs): 0.1 10*3/uL (ref 0.0–0.1)
Immature Granulocytes: 1 %
Lymphocytes Absolute: 1.6 10*3/uL (ref 0.7–3.1)
Lymphs: 18 %
MCH: 27.7 pg (ref 26.6–33.0)
MCHC: 33.8 g/dL (ref 31.5–35.7)
MCV: 82 fL (ref 79–97)
Monocytes Absolute: 0.9 10*3/uL (ref 0.1–0.9)
Monocytes: 10 %
Neutrophils Absolute: 6.4 10*3/uL (ref 1.4–7.0)
Neutrophils: 69 %
Platelets: 407 10*3/uL (ref 150–450)
RBC: 5.66 x10E6/uL (ref 4.14–5.80)
RDW: 13.5 % (ref 11.6–15.4)
WBC: 9.1 10*3/uL (ref 3.4–10.8)

## 2020-02-20 LAB — COMPREHENSIVE METABOLIC PANEL
ALT: 23 IU/L (ref 0–44)
AST: 21 IU/L (ref 0–40)
Albumin/Globulin Ratio: 1.2 (ref 1.2–2.2)
Albumin: 3.9 g/dL (ref 3.8–4.9)
Alkaline Phosphatase: 86 IU/L (ref 44–121)
BUN/Creatinine Ratio: 9 (ref 9–20)
BUN: 8 mg/dL (ref 6–24)
Bilirubin Total: 0.5 mg/dL (ref 0.0–1.2)
CO2: 22 mmol/L (ref 20–29)
Calcium: 9.3 mg/dL (ref 8.7–10.2)
Chloride: 99 mmol/L (ref 96–106)
Creatinine, Ser: 0.86 mg/dL (ref 0.76–1.27)
GFR calc Af Amer: 113 mL/min/{1.73_m2} (ref >59)
GFR calc non Af Amer: 98 mL/min/{1.73_m2} (ref >59)
Globulin, Total: 3.2 g/dL (ref 1.5–4.5)
Glucose: 98 mg/dL (ref 65–99)
Potassium: 4.6 mmol/L (ref 3.5–5.2)
Sodium: 134 mmol/L (ref 134–144)
Total Protein: 7.1 g/dL (ref 6.0–8.5)

## 2020-02-20 LAB — HEMOGLOBIN A1C
Est. average glucose Bld gHb Est-mCnc: 131 mg/dL
Hgb A1c MFr Bld: 6.2 % — ABNORMAL HIGH (ref 4.8–5.6)

## 2020-02-20 LAB — LIPID PANEL
Chol/HDL Ratio: 3.8 ratio (ref 0.0–5.0)
Cholesterol, Total: 138 mg/dL (ref 100–199)
HDL: 36 mg/dL — ABNORMAL LOW (ref >39)
LDL Chol Calc (NIH): 83 mg/dL (ref 0–99)
Triglycerides: 100 mg/dL (ref 0–149)
VLDL Cholesterol Cal: 19 mg/dL (ref 5–40)

## 2020-02-20 LAB — CARDIOVASCULAR RISK ASSESSMENT

## 2020-03-06 ENCOUNTER — Other Ambulatory Visit: Payer: Self-pay | Admitting: Adult Health

## 2020-03-18 ENCOUNTER — Other Ambulatory Visit: Payer: Self-pay

## 2020-03-18 ENCOUNTER — Encounter: Payer: Self-pay | Admitting: Nurse Practitioner

## 2020-03-18 ENCOUNTER — Ambulatory Visit (INDEPENDENT_AMBULATORY_CARE_PROVIDER_SITE_OTHER): Payer: PPO | Admitting: Nurse Practitioner

## 2020-03-18 VITALS — BP 148/72 | HR 65 | Temp 97.7°F | Ht 77.0 in | Wt 361.0 lb

## 2020-03-18 DIAGNOSIS — I10 Essential (primary) hypertension: Secondary | ICD-10-CM

## 2020-03-18 LAB — COMPREHENSIVE METABOLIC PANEL
ALT: 25 IU/L (ref 0–44)
AST: 21 IU/L (ref 0–40)
Albumin/Globulin Ratio: 1.4 (ref 1.2–2.2)
Albumin: 3.9 g/dL (ref 3.8–4.9)
Alkaline Phosphatase: 86 IU/L (ref 44–121)
BUN/Creatinine Ratio: 12 (ref 9–20)
BUN: 9 mg/dL (ref 6–24)
Bilirubin Total: 0.7 mg/dL (ref 0.0–1.2)
CO2: 21 mmol/L (ref 20–29)
Calcium: 9.2 mg/dL (ref 8.7–10.2)
Chloride: 100 mmol/L (ref 96–106)
Creatinine, Ser: 0.73 mg/dL — ABNORMAL LOW (ref 0.76–1.27)
GFR calc Af Amer: 121 mL/min/{1.73_m2} (ref >59)
GFR calc non Af Amer: 104 mL/min/{1.73_m2} (ref >59)
Globulin, Total: 2.8 g/dL (ref 1.5–4.5)
Glucose: 96 mg/dL (ref 65–99)
Potassium: 4.5 mmol/L (ref 3.5–5.2)
Sodium: 134 mmol/L (ref 134–144)
Total Protein: 6.7 g/dL (ref 6.0–8.5)

## 2020-03-18 NOTE — Progress Notes (Signed)
Established Patient Office Visit  Subjective:  Patient ID: [name], male    DOB: [birth year]  Age: 55 y.o. MRN: [id number]  CC:  Chief Complaint  Patient presents with  . Hypertension    4 Week follow up    HPI [name] is a 55 y.o. male  for follow-up of hypertension. He is accompanied by his father who assists with medical history. Current treatment includes Lisinopril 20 mg daily. BP in office today is 148/72 decreased from 158/98 on 02/19/20, improved but not at goal. Pt denies chest pain, dyspnea, headaches, or dizziness.He is not monitoring BP at home. Pt is adherent to medication regimen and follow-up appointments. Pt is currently consuming a heart healthy diet and participating in regular physical exercise.   Current Outpatient Medications  Medication Sig Dispense Refill  . aspirin 81 MG tablet Take 81 mg by mouth daily.    . clonazePAM (KLONOPIN) 0.5 MG tablet Take 1 tablet by mouth once daily 30 tablet 1  . fludrocortisone (FLORINEF) 0.1 MG tablet Take 0.2 mg by mouth 2 (two) times daily.    . hydrocortisone (CORTEF) 10 MG tablet Take 10 mg by mouth daily.    Marland Kitchen levETIRAcetam (KEPPRA) 1000 MG tablet Take 1 tablet (1,000 mg total) by mouth 2 (two) times daily. 180 tablet 1  . lisinopril (ZESTRIL) 20 MG tablet Take 1 tablet (20 mg total) by mouth daily. 90 tablet 3  . Melatonin 5 MG TABS Take by mouth at bedtime.    . memantine (NAMENDA) 10 MG tablet Take 1 tablet by mouth twice daily 180 tablet 0  . MYRBETRIQ 25 MG TB24 tablet Take 1 tablet by mouth once daily 90 tablet 3  . pramipexole (MIRAPEX) 0.125 MG tablet Take 1 tablet by mouth twice daily 180 tablet 1  . Sod Fluoride-Potassium Nitrate 1.1-5 % PSTE Place 1 application onto teeth 2 (two) times daily.    . SODIUM CHLORIDE PO Take 1,000 mg by mouth in the morning, at noon, and at bedtime.    Marland Kitchen venlafaxine XR (EFFEXOR-XR) 75 MG 24 hr capsule TAKE 3 CAPSULES BY MOUTH ONCE DAILY 270 capsule 0   No current  facility-administered medications for this visit.     [place] .  Past Medical History:  Diagnosis Date  . Anxiety   . 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 Atrial fibrillation ([place])   . Cardiomyopathy   . Carpal tunnel syndrome   . Chiari I malformation ([place])   . Coronary artery disease   . Depression   . Headache(784.0)   . Hydrocephalus ([place])   . Hypertension   . Memory loss   . Obstructive sleep apnea   . Pulmonary embolism ([place])   . Renal calculi   . Restless leg syndrome   . Salt-wasting syndrome of infancy   . Seizure ([place])   . SIADH (syndrome of inappropriate ADH production) ([place])   . Syncope    Recurrent, thought to be neurally mediated    Past Surgical History:  Procedure Laterality Date  . CHOLECYSTECTOMY    . LOOP RECORDER EXPLANT N/A 10/30/2012   Procedure: LOOP RECORDER EXPLANT;  Surgeon: Deboraha Sprang, MD;  Location: [facility] CATH LAB;  Service: Cardiovascular;  Laterality: N/A;  . Nissen Fundoplasty    . PROSTATE SURGERY    . St. Jude DM [id number] loop     S/p loop recorder  . VENTRICULOPERITONEAL SHUNT Bilateral     Family History  Problem Relation Age of  Onset  . Migraines Mother   . CAD Mother   . Hypertension Mother   . Endometriosis Sister   . Seizures Neg Hx     Social History   Socioeconomic History  . Marital status: Married    Spouse name: [name]  . Number of children: 0  . Years of education: 26  . Highest education level: Not on file  Occupational History  . Occupation: CUSTOMER SERVICE    Employer: [employer]: Not working   Tobacco Use  . Smoking status: Never Smoker  . Smokeless tobacco: Never Used  Substance and Sexual Activity  . Alcohol use: No    Alcohol/week: 0.0 standard drinks  . Drug use: No  . Sexual activity: Not on file  Other Topics Concern  . Not on file  Social History Narrative   Patient is married [name]), no children   Patient is right handed   Education level is high school graduate    Caffeine consumption is 1/2 of 2L daily   Social Determinants of Health   Financial Resource Strain: Not on file  Food Insecurity: Not on file  Transportation Needs: Not on file  Physical Activity: Not on file  Stress: Not on file  Social Connections: Not on file  Intimate Partner Violence: Not on file    Outpatient Medications Prior to Visit  Medication Sig Dispense Refill  . aspirin 81 MG tablet Take 81 mg by mouth daily.    . clonazePAM (KLONOPIN) 0.5 MG tablet Take 1 tablet by mouth once daily 30 tablet 1  . fludrocortisone (FLORINEF) 0.1 MG tablet Take 0.2 mg by mouth 2 (two) times daily.    . hydrocortisone (CORTEF) 10 MG tablet Take 10 mg by mouth daily.    Marland Kitchen levETIRAcetam (KEPPRA) 1000 MG tablet Take 1 tablet (1,000 mg total) by mouth 2 (two) times daily. 180 tablet 1  . lisinopril (ZESTRIL) 20 MG tablet Take 1 tablet (20 mg total) by mouth daily. 90 tablet 3  . Melatonin 5 MG TABS Take by mouth at bedtime.    . memantine (NAMENDA) 10 MG tablet Take 1 tablet by mouth twice daily 180 tablet 0  . MYRBETRIQ 25 MG TB24 tablet Take 1 tablet by mouth once daily 90 tablet 3  . pramipexole (MIRAPEX) 0.125 MG tablet Take 1 tablet by mouth twice daily 180 tablet 1  . Sod Fluoride-Potassium Nitrate 1.1-5 % PSTE Place 1 application onto teeth 2 (two) times daily.    . SODIUM CHLORIDE PO Take 1,000 mg by mouth in the morning, at noon, and at bedtime.    Marland Kitchen venlafaxine XR (EFFEXOR-XR) 75 MG 24 hr capsule TAKE 3 CAPSULES BY MOUTH ONCE DAILY 270 capsule 0   No facility-administered medications prior to visit.    Allergies  Allergen Reactions  . Omnipaque [Iohexol] Hives  . Prednisone Nausea And Vomiting and Other (See Comments)    unknown    ROS Review of Systems  Constitutional: Negative for fatigue and fever.  HENT: Negative for congestion, ear pain and sore throat.   Eyes: Negative for pain.  Respiratory: Negative for cough and shortness of breath.   Cardiovascular: Negative  for chest pain, palpitations and leg swelling.  Gastrointestinal: Negative for abdominal pain, anal bleeding, constipation, diarrhea, nausea and vomiting.  Endocrine: Negative for cold intolerance, heat intolerance, polydipsia, polyphagia and polyuria.  Genitourinary: Negative for difficulty urinating, dysuria, frequency and urgency.  Musculoskeletal: Negative for arthralgias, back pain, joint swelling and myalgias.  Skin:  Negative.  Negative for rash.  Allergic/Immunologic: Positive for environmental allergies.  Neurological: Negative for dizziness and headaches.       Memory loss   Hematological: Negative for adenopathy.  Psychiatric/Behavioral: Negative for dysphoric mood and sleep disturbance. The patient is not nervous/anxious.       Objective:    Physical Exam Vitals reviewed.  Constitutional:      Appearance: Normal appearance.  HENT:     Right Ear: Tympanic membrane normal.     Left Ear: Tympanic membrane normal.     Mouth/Throat:     Mouth: Mucous membranes are moist.  Cardiovascular:     Rate and Rhythm: Normal rate and regular rhythm.     Pulses: Normal pulses.     Heart sounds: Normal heart sounds.  Pulmonary:     Effort: Pulmonary effort is normal.     Breath sounds: Normal breath sounds.  Abdominal:     General: Bowel sounds are normal.     Palpations: Abdomen is soft.  Musculoskeletal:        General: Normal range of motion.     Cervical back: Neck supple.  Skin:    General: Skin is warm and dry.     Capillary Refill: Capillary refill takes less than 2 seconds.  Neurological:     General: No focal deficit present.     Mental Status: He is alert and oriented to person, place, and time.  Psychiatric:        Mood and Affect: Mood normal.        Behavior: Behavior normal.     BP (!) 148/72 (BP Location: Left Arm, Patient Position: Sitting)   Pulse 65   Temp 97.7 F (36.5 C) (Temporal)   Ht 6\' 5"  (1.956 m)   Wt (!) 361 lb (163.7 kg)   SpO2 98%   BMI  42.81 kg/m  Wt Readings from Last 3 Encounters:  03/18/20 (!) 361 lb (163.7 kg)  02/19/20 (!) 362 lb (164.2 kg)  11/18/19 (!) 372 lb (168.7 kg)     Health Maintenance Due  Topic Date Due  . Hepatitis C Screening  Never done  . TETANUS/TDAP  Never done    There are no preventive care reminders to display for this patient.  Lab Results  Component Value Date   TSH 2.440 03/11/2019   Lab Results  Component Value Date   WBC 9.1 02/19/2020   HGB 15.7 02/19/2020   HCT 46.4 02/19/2020   MCV 82 02/19/2020   PLT 407 02/19/2020   Lab Results  Component Value Date   NA 134 02/19/2020   K 4.6 02/19/2020   CO2 22 02/19/2020   GLUCOSE 98 02/19/2020   BUN 8 02/19/2020   CREATININE 0.86 02/19/2020   BILITOT 0.5 02/19/2020   ALKPHOS 86 02/19/2020   AST 21 02/19/2020   ALT 23 02/19/2020   PROT 7.1 02/19/2020   ALBUMIN 3.9 02/19/2020   CALCIUM 9.3 02/19/2020   Lab Results  Component Value Date   CHOL 138 02/19/2020   Lab Results  Component Value Date   HDL 36 (L) 02/19/2020   Lab Results  Component Value Date   LDLCALC 83 02/19/2020   Lab Results  Component Value Date   TRIG 100 02/19/2020   Lab Results  Component Value Date   CHOLHDL 3.8 02/19/2020   Lab Results  Component Value Date   HGBA1C 6.2 (H) 02/19/2020      Assessment & Plan:   1. Essential hypertension-moderately well  controlled but not at goal - Comprehensive metabolic panel -Continue Lisinopril 20 mg daily -Continue physical activity -Continue heart healthy diet -Monitor BP at home  I, Rip Harbour, NP, have reviewed all documentation for this visit. The documentation on 03/18/20 for the exam, diagnosis, procedures, and orders are all accurate and complete.      Cardiovascular and Mediastinum   Essential hypertension - Primary      Monitor blood pressure at home Continue Lisinopril 20 mg daily Continue heart healthy diet Continue physical activity Follow-up in 63-months,  fasting     Follow-up: Return in about 3 months (around 06/15/2020).    Signed,  Jerrell Belfast, DNP

## 2020-03-18 NOTE — Patient Instructions (Addendum)
Monitor blood pressure at home Continue Lisinopril 20 mg daily Continue heart healthy diet Continue physical activity Follow-up in 48-months, fasting  Hypertension, Adult Hypertension is another name for high blood pressure. High blood pressure forces your heart to work harder to pump blood. This can cause problems over time. There are two numbers in a blood pressure reading. There is a top number (systolic) over a bottom number (diastolic). It is best to have a blood pressure that is below 120/80. Healthy choices can help lower your blood pressure, or you may need medicine to help lower it. What are the causes? The cause of this condition is not known. Some conditions may be related to high blood pressure. What increases the risk?  Smoking.  Having type 2 diabetes mellitus, high cholesterol, or both.  Not getting enough exercise or physical activity.  Being overweight.  Having too much fat, sugar, calories, or salt (sodium) in your diet.  Drinking too much alcohol.  Having long-term (chronic) kidney disease.  Having a family history of high blood pressure.  Age. Risk increases with age.  Race. You may be at higher risk if you are African American.  Gender. Men are at higher risk than women before age 23. After age 33, women are at higher risk than men.  Having obstructive sleep apnea.  Stress. What are the signs or symptoms?  High blood pressure may not cause symptoms. Very high blood pressure (hypertensive crisis) may cause: ? Headache. ? Feelings of worry or nervousness (anxiety). ? Shortness of breath. ? Nosebleed. ? A feeling of being sick to your stomach (nausea). ? Throwing up (vomiting). ? Changes in how you see. ? Very bad chest pain. ? Seizures. How is this treated?  This condition is treated by making healthy lifestyle changes, such as: ? Eating healthy foods. ? Exercising more. ? Drinking less alcohol.  Your health care provider may prescribe  medicine if lifestyle changes are not enough to get your blood pressure under control, and if: ? Your top number is above 130. ? Your bottom number is above 80.  Your personal target blood pressure may vary. Follow these instructions at home: Eating and drinking  If told, follow the DASH eating plan. To follow this plan: ? Fill one half of your plate at each meal with fruits and vegetables. ? Fill one fourth of your plate at each meal with whole grains. Whole grains include whole-wheat pasta, brown rice, and whole-grain bread. ? Eat or drink low-fat dairy products, such as skim milk or low-fat yogurt. ? Fill one fourth of your plate at each meal with low-fat (lean) proteins. Low-fat proteins include fish, chicken without skin, eggs, beans, and tofu. ? Avoid fatty meat, cured and processed meat, or chicken with skin. ? Avoid pre-made or processed food.  Eat less than 1,500 mg of salt each day.  Do not drink alcohol if: ? Your doctor tells you not to drink. ? You are pregnant, may be pregnant, or are planning to become pregnant.  If you drink alcohol: ? Limit how much you use to:  0-1 drink a day for women.  0-2 drinks a day for men. ? Be aware of how much alcohol is in your drink. In the [place] one drink equals one 12 oz bottle of beer (355 mL), one 5 oz glass of wine (148 mL), or one 1 oz glass of hard liquor (44 mL).   Lifestyle  Work with your doctor to stay at a healthy weight or  to lose weight. Ask your doctor what the best weight is for you.  Get at least 30 minutes of exercise most days of the week. This may include walking, swimming, or biking.  Get at least 30 minutes of exercise that strengthens your muscles (resistance exercise) at least 3 days a week. This may include lifting weights or doing Pilates.  Do not use any products that contain nicotine or tobacco, such as cigarettes, e-cigarettes, and chewing tobacco. If you need help quitting, ask your doctor.  Check  your blood pressure at home as told by your doctor.  Keep all follow-up visits as told by your doctor. This is important.   Medicines  Take over-the-counter and prescription medicines only as told by your doctor. Follow directions carefully.  Do not skip doses of blood pressure medicine. The medicine does not work as well if you skip doses. Skipping doses also puts you at risk for problems.  Ask your doctor about side effects or reactions to medicines that you should watch for. Contact a doctor if you:  Think you are having a reaction to the medicine you are taking.  Have headaches that keep coming back (recurring).  Feel dizzy.  Have swelling in your ankles.  Have trouble with your vision. Get help right away if you:  Get a very bad headache.  Start to feel mixed up (confused).  Feel weak or numb.  Feel faint.  Have very bad pain in your: ? Chest. ? Belly (abdomen).  Throw up more than once.  Have trouble breathing. Summary  Hypertension is another name for high blood pressure.  High blood pressure forces your heart to work harder to pump blood.  For most people, a normal blood pressure is less than 120/80.  Making healthy choices can help lower blood pressure. If your blood pressure does not get lower with healthy choices, you may need to take medicine. This information is not intended to replace advice given to you by your health care provider. Make sure you discuss any questions you have with your health care provider. Document Revised: 09/19/2017 Document Reviewed: 09/19/2017 Elsevier Patient Education  2021 [place]. [email or web address]">  DASH Eating Plan DASH stands for Dietary Approaches to Stop Hypertension. The DASH eating plan is a healthy eating plan that has been shown to:  Reduce high blood pressure (hypertension).  Reduce your risk for type 2 diabetes, heart disease, and stroke.  Help with  weight loss. What are tips for following this plan? Reading food labels  Check food labels for the amount of salt (sodium) per serving. Choose foods with less than 5 percent of the Daily Value of sodium. Generally, foods with less than 300 milligrams (mg) of sodium per serving fit into this eating plan.  To find whole grains, look for the word "whole" as the first word in the ingredient list. Shopping  Buy products labeled as "low-sodium" or "no salt added."  Buy fresh foods. Avoid canned foods and pre-made or frozen meals. Cooking  Avoid adding salt when cooking. Use salt-free seasonings or herbs instead of table salt or sea salt. Check with your health care provider or pharmacist before using salt substitutes.  Do not fry foods. Cook foods using healthy methods such as baking, boiling, grilling, roasting, and broiling instead.  Cook with heart-healthy oils, such as olive, canola, avocado, soybean, or sunflower oil. Meal planning  Eat a balanced diet that includes: ? 4 or more servings of fruits and 4 or more servings of vegetables  each day. Try to fill one-half of your plate with fruits and vegetables. ? 6-8 servings of whole grains each day. ? Less than 6 oz (170 g) of lean meat, poultry, or fish each day. A 3-oz (85-g) serving of meat is about the same size as a deck of cards. One egg equals 1 oz (28 g). ? 2-3 servings of low-fat dairy each day. One serving is 1 cup (237 mL). ? 1 serving of nuts, seeds, or beans 5 times each week. ? 2-3 servings of heart-healthy fats. Healthy fats called omega-3 fatty acids are found in foods such as walnuts, flaxseeds, fortified milks, and eggs. These fats are also found in cold-water fish, such as sardines, salmon, and mackerel.  Limit how much you eat of: ? Canned or prepackaged foods. ? Food that is high in trans fat, such as some fried foods. ? Food that is high in saturated fat, such as fatty meat. ? Desserts and other sweets, sugary  drinks, and other foods with added sugar. ? Full-fat dairy products.  Do not salt foods before eating.  Do not eat more than 4 egg yolks a week.  Try to eat at least 2 vegetarian meals a week.  Eat more home-cooked food and less restaurant, buffet, and fast food.   Lifestyle  When eating at a restaurant, ask that your food be prepared with less salt or no salt, if possible.  If you drink alcohol: ? Limit how much you use to:  0-1 drink a day for women who are not pregnant.  0-2 drinks a day for men. ? Be aware of how much alcohol is in your drink. In the [place] one drink equals one 12 oz bottle of beer (355 mL), one 5 oz glass of wine (148 mL), or one 1 oz glass of hard liquor (44 mL). General information  Avoid eating more than 2,300 mg of salt a day. If you have hypertension, you may need to reduce your sodium intake to 1,500 mg a day.  Work with your health care provider to maintain a healthy body weight or to lose weight. Ask what an ideal weight is for you.  Get at least 30 minutes of exercise that causes your heart to beat faster (aerobic exercise) most days of the week. Activities may include walking, swimming, or biking.  Work with your health care provider or dietitian to adjust your eating plan to your individual calorie needs. What foods should I eat? Fruits All fresh, dried, or frozen fruit. Canned fruit in natural juice (without added sugar). Vegetables Fresh or frozen vegetables (raw, steamed, roasted, or grilled). Low-sodium or reduced-sodium tomato and vegetable juice. Low-sodium or reduced-sodium tomato sauce and tomato paste. Low-sodium or reduced-sodium canned vegetables. Grains Whole-grain or whole-wheat bread. Whole-grain or whole-wheat pasta. Brown rice. [place]. Bulgur. Whole-grain and low-sodium cereals. Pita bread. Low-fat, low-sodium crackers. Whole-wheat flour tortillas. Meats and other proteins Skinless chicken or [place]. Ground chicken or  [place]. Pork with fat trimmed off. Fish and seafood. Egg whites. Dried beans, peas, or lentils. Unsalted nuts, nut butters, and seeds. Unsalted canned beans. Lean cuts of beef with fat trimmed off. Low-sodium, lean precooked or cured meat, such as sausages or meat loaves. Dairy Low-fat (1%) or fat-free (skim) milk. Reduced-fat, low-fat, or fat-free cheeses. Nonfat, low-sodium ricotta or cottage cheese. Low-fat or nonfat yogurt. Low-fat, low-sodium cheese. Fats and oils Soft margarine without trans fats. Vegetable oil. Reduced-fat, low-fat, or light mayonnaise and salad dressings (reduced-sodium). Canola, safflower, olive, avocado,  soybean, and sunflower oils. Avocado. Seasonings and condiments Herbs. Spices. Seasoning mixes without salt. Other foods Unsalted popcorn and pretzels. Fat-free sweets. The items listed above may not be a complete list of foods and beverages you can eat. Contact a dietitian for more information. What foods should I avoid? Fruits Canned fruit in a light or heavy syrup. Fried fruit. Fruit in cream or butter sauce. Vegetables Creamed or fried vegetables. Vegetables in a cheese sauce. Regular canned vegetables (not low-sodium or reduced-sodium). Regular canned tomato sauce and paste (not low-sodium or reduced-sodium). Regular tomato and vegetable juice (not low-sodium or reduced-sodium). Angie Fava. Olives. Grains Baked goods made with fat, such as croissants, muffins, or some breads. Dry pasta or rice meal packs. Meats and other proteins Fatty cuts of meat. Ribs. Fried meat. Berniece Salines. Bologna, salami, and other precooked or cured meats, such as sausages or meat loaves. Fat from the back of a pig (fatback). Bratwurst. Salted nuts and seeds. Canned beans with added salt. Canned or smoked fish. Whole eggs or egg yolks. Chicken or [place] with skin. Dairy Whole or 2% milk, cream, and half-and-half. Whole or full-fat cream cheese. Whole-fat or sweetened yogurt. Full-fat cheese.  Nondairy creamers. Whipped toppings. Processed cheese and cheese spreads. Fats and oils Butter. Stick margarine. Lard. Shortening. Ghee. Bacon fat. Tropical oils, such as coconut, palm kernel, or palm oil. Seasonings and condiments Onion salt, garlic salt, seasoned salt, table salt, and sea salt. Worcestershire sauce. Tartar sauce. Barbecue sauce. Teriyaki sauce. Soy sauce, including reduced-sodium. Steak sauce. Canned and packaged gravies. Fish sauce. Oyster sauce. Cocktail sauce. Store-bought horseradish. Ketchup. Mustard. Meat flavorings and tenderizers. Bouillon cubes. Hot sauces. Pre-made or packaged marinades. Pre-made or packaged taco seasonings. Relishes. Regular salad dressings. Other foods Salted popcorn and pretzels. The items listed above may not be a complete list of foods and beverages you should avoid. Contact a dietitian for more information. Where to find more information  National Heart, Lung, and Blood Institute: https://wilson-eaton.com/  American Heart Association: www.heart.org  Academy of Nutrition and Dietetics: www.eatright.[place]: www.kidney.org Summary  The DASH eating plan is a healthy eating plan that has been shown to reduce high blood pressure (hypertension). It may also reduce your risk for type 2 diabetes, heart disease, and stroke.  When on the DASH eating plan, aim to eat more fresh fruits and vegetables, whole grains, lean proteins, low-fat dairy, and heart-healthy fats.  With the DASH eating plan, you should limit salt (sodium) intake to 2,300 mg a day. If you have hypertension, you may need to reduce your sodium intake to 1,500 mg a day.  Work with your health care provider or dietitian to adjust your eating plan to your individual calorie needs. This information is not intended to replace advice given to you by your health care provider. Make sure you discuss any questions you have with your health care provider. Document Revised:  12/13/2018 Document Reviewed: 12/13/2018 Elsevier Patient Education  2021 [employer].

## 2020-03-27 ENCOUNTER — Other Ambulatory Visit: Payer: Self-pay | Admitting: Legal Medicine

## 2020-04-07 ENCOUNTER — Telehealth: Payer: Self-pay | Admitting: Family Medicine

## 2020-04-07 NOTE — Progress Notes (Signed)
  Chronic Care Management   Note  04/07/2020 Name: [name] MRN: [id number] DOB: [birth year]  [name] is a 55 y.o. year old male who is a primary care patient of Cox, Kirsten, MD. I reached out to [employer] by phone today in response to a referral sent by [name]'s PCP, Cox, Kirsten, MD.   Mr. Spray was given information about Chronic Care Management services today including:  1. CCM service includes personalized support from designated clinical staff supervised by his physician, including individualized plan of care and coordination with other care providers 2. 24/7 contact phone numbers for assistance for urgent and routine care needs. 3. Service will only be billed when office clinical staff spend 20 minutes or more in a month to coordinate care. 4. Only one practitioner may furnish and bill the service in a calendar month. 5. The patient may stop CCM services at any time (effective at the end of the month) by phone call to the office staff.   Patient did not agree to enrollment in care management services and does not wish to consider at this time.  Follow up plan:   Carley Perdue [employer] Scheduler

## 2020-04-15 ENCOUNTER — Other Ambulatory Visit: Payer: Self-pay | Admitting: Family Medicine

## 2020-04-15 DIAGNOSIS — F330 Major depressive disorder, recurrent, mild: Secondary | ICD-10-CM

## 2020-05-04 ENCOUNTER — Ambulatory Visit: Payer: PPO | Admitting: Neurology

## 2020-05-04 ENCOUNTER — Other Ambulatory Visit: Payer: Self-pay

## 2020-05-04 ENCOUNTER — Encounter: Payer: Self-pay | Admitting: Neurology

## 2020-05-04 VITALS — BP 169/102 | HR 74 | Ht 76.0 in | Wt 363.0 lb

## 2020-05-04 DIAGNOSIS — R413 Other amnesia: Secondary | ICD-10-CM

## 2020-05-04 DIAGNOSIS — G2581 Restless legs syndrome: Secondary | ICD-10-CM | POA: Diagnosis not present

## 2020-05-04 DIAGNOSIS — Q039 Congenital hydrocephalus, unspecified: Secondary | ICD-10-CM | POA: Diagnosis not present

## 2020-05-04 DIAGNOSIS — R569 Unspecified convulsions: Secondary | ICD-10-CM | POA: Diagnosis not present

## 2020-05-04 HISTORY — DX: Restless legs syndrome: G25.81

## 2020-05-04 MED ORDER — LEVETIRACETAM 1000 MG PO TABS: 1000.0000 mg | ORAL_TABLET | Freq: Two times a day (BID) | ORAL | 3 refills | Status: DC

## 2020-05-04 MED ORDER — PRAMIPEXOLE DIHYDROCHLORIDE 0.125 MG PO TABS: 0.1250 mg | ORAL_TABLET | Freq: Two times a day (BID) | ORAL | 3 refills | Status: DC

## 2020-05-04 NOTE — Progress Notes (Signed)
Reason for visit: Seizures, memory disturbance  [name] is an 55 y.o. male  History of present illness:  [name] is a 55 year old right-handed white male with a history of aqueductal stenosis with hydrocephalus, status post VP shunt.  He has some baseline memory problems, he has adrenal insufficiency and is being treated with Florinef and hydrocortisone..  The patient does have a history of seizures that have been well controlled on Keppra, he tolerates the drug fairly well.  The patient lost his wife within the last year, he now lives alone and is planning on moving into [place].  The patient comes into the office today with his father.  The father manages the medications.  The patient has prediabetes with a hemoglobin A1c of 6.2, he does have some problems with high blood pressure and restless leg syndrome.  He takes low-dose Mirapex for this.  He is on Namenda for his memory issues.  The patient does not operate a motor vehicle.  He returns for further evaluation.  Past Medical History:  Diagnosis Date  . Anxiety   . Aqueductal stenosis ([place])   . Atrial fibrillation ([place])   . Cardiomyopathy   . Carpal tunnel syndrome   . Chiari I malformation ([place])   . Coronary artery disease   . Depression   . Headache(784.0)   . Hydrocephalus ([place])   . Hypertension   . Memory loss   . Obstructive sleep apnea   . Pulmonary embolism ([place])   . Renal calculi   . Restless leg syndrome   . Salt-wasting syndrome of infancy   . Seizure ([place])   . SIADH (syndrome of inappropriate ADH production) ([place])   . Syncope    Recurrent, thought to be neurally mediated    Past Surgical History:  Procedure Laterality Date  . CHOLECYSTECTOMY    . LOOP RECORDER EXPLANT N/A 10/30/2012   Procedure: LOOP RECORDER EXPLANT;  Surgeon: Deboraha Sprang, MD;  [facility] LAB;  Service: Cardiovascular;  Laterality: N/A;  . Nissen Fundoplasty    . PROSTATE SURGERY    . St. Jude DM [phone] loop      S/p loop recorder  . VENTRICULOPERITONEAL SHUNT Bilateral     Family History  Problem Relation Age of Onset  . Migraines Mother   . CAD Mother   . Hypertension Mother   . Endometriosis Sister   . Seizures Neg Hx     Social history:  reports that he has never smoked. He has never used smokeless tobacco. He reports that he does not drink alcohol and does not use drugs.    Allergies  Allergen Reactions  . Omnipaque [Iohexol] Hives  . Prednisone Nausea And Vomiting and Other (See Comments)    unknown    Medications:  Prior to Admission medications   Medication Sig Start Date End Date Taking? Authorizing Provider  aspirin 81 MG tablet Take 81 mg by mouth daily.   Yes [provider]  clonazePAM (KLONOPIN) 0.5 MG tablet Take 1 tablet by mouth once daily 04/15/20  Yes Cox, Kirsten, MD  fludrocortisone (FLORINEF) 0.1 MG tablet Take 0.2 mg by mouth 2 (two) times daily.   Yes [provider]  hydrocortisone (CORTEF) 10 MG tablet Take 10 mg by mouth daily.   Yes [provider]  levETIRAcetam (KEPPRA) 1000 MG tablet Take 1 tablet (1,000 mg total) by mouth 2 (two) times daily. 09/25/19  Yes Ward Givens, NP  lisinopril (ZESTRIL) 20 MG tablet Take  1 tablet (20 mg total) by mouth daily. 02/19/20  Yes Cox, Kirsten, MD  Melatonin 5 MG TABS Take by mouth at bedtime.   Yes [provider]  memantine (NAMENDA) 10 MG tablet Take 1 tablet by mouth twice daily 03/08/20  Yes Ward Givens, NP  MYRBETRIQ 25 MG TB24 tablet Take 1 tablet by mouth once daily 01/12/20  Yes Kathrynn Ducking, MD  pramipexole (MIRAPEX) 0.125 MG tablet Take 1 tablet by mouth twice daily 12/01/19  Yes Kathrynn Ducking, MD  Sod Fluoride-Potassium Nitrate 1.1-5 % PSTE Place 1 application onto teeth 2 (two) times daily.   Yes [provider]  SODIUM CHLORIDE PO Take 1,000 mg by mouth in the morning, at noon, and at bedtime.   Yes [provider]  venlafaxine XR (EFFEXOR-XR) 75  MG 24 hr capsule TAKE 3 CAPSULES BY MOUTH ONCE DAILY 01/27/20  Yes Marge Duncans, PA-C    ROS:  Out of a complete 14 system review of symptoms, the patient complains only of the following symptoms, and all other reviewed systems are negative.  Memory problems Weight gain Tremor  Blood pressure (!) 169/102, pulse 74, height 6\' 4"  (1.93 m), weight (!) 363 lb (164.7 kg).  Physical Exam  General: The patient is alert and cooperative at the time of the examination.  The patient is moderately obese.  Skin:  1-2+ edema below the knees are seen bilaterally.   Neurologic Exam  Mental status: The patient is alert and oriented x 3 at the time of the examination. The Mini-Mental status examination done today shows a total score 24/30.   Cranial nerves: Facial symmetry is present. Speech is normal, no aphasia or dysarthria is noted. Extraocular movements are full. Visual fields are full.  Motor: The patient has good strength in all 4 extremities.  Sensory examination: Soft touch sensation is symmetric on the face, arms, and legs.  Coordination: The patient has good finger-nose-finger and heel-to-shin bilaterally.  The patient has some intention tremors with use of the upper extremities.  Gait and station: The patient has a normal gait. Tandem gait is unsteady. Romberg is negative. No drift is seen.  Reflexes: Deep tendon reflexes are symmetric.   Assessment/Plan:  1.  History of seizures, well controlled  2.  Mild memory disturbance  3.  Aqua ductal stenosis, status post VP shunt  4.  Restless leg syndrome  The patient seems to be doing fairly well with his seizures, we will continue the [place].  A prescription for the Keppra and for the Mirapex was sent in.  He will remain on Namenda for the memory.  He will follow up here in 1 year.  Jill Alexanders MD 05/04/2020 11:26 AM  [facility] [address] [place], [place] [address]  Phone  [phone] Fax [phone]

## 2020-05-17 DIAGNOSIS — R7303 Prediabetes: Secondary | ICD-10-CM | POA: Diagnosis not present

## 2020-05-17 DIAGNOSIS — E2740 Unspecified adrenocortical insufficiency: Secondary | ICD-10-CM | POA: Diagnosis not present

## 2020-05-17 DIAGNOSIS — E871 Hypo-osmolality and hyponatremia: Secondary | ICD-10-CM | POA: Diagnosis not present

## 2020-05-22 ENCOUNTER — Other Ambulatory Visit: Payer: Self-pay | Admitting: Adult Health

## 2020-05-22 ENCOUNTER — Other Ambulatory Visit: Payer: Self-pay | Admitting: Physician Assistant

## 2020-05-22 DIAGNOSIS — F320 Major depressive disorder, single episode, mild: Secondary | ICD-10-CM

## 2020-06-23 ENCOUNTER — Ambulatory Visit (INDEPENDENT_AMBULATORY_CARE_PROVIDER_SITE_OTHER): Payer: PPO | Admitting: Family Medicine

## 2020-06-23 ENCOUNTER — Other Ambulatory Visit: Payer: Self-pay

## 2020-06-23 VITALS — BP 136/84 | HR 72 | Temp 98.0°F | Resp 16 | Ht 76.0 in | Wt 351.0 lb

## 2020-06-23 DIAGNOSIS — E7849 Other hyperlipidemia: Secondary | ICD-10-CM | POA: Diagnosis not present

## 2020-06-23 DIAGNOSIS — I10 Essential (primary) hypertension: Secondary | ICD-10-CM

## 2020-06-23 DIAGNOSIS — E2740 Unspecified adrenocortical insufficiency: Secondary | ICD-10-CM

## 2020-06-23 DIAGNOSIS — G40909 Epilepsy, unspecified, not intractable, without status epilepticus: Secondary | ICD-10-CM

## 2020-06-23 DIAGNOSIS — W57XXXA Bitten or stung by nonvenomous insect and other nonvenomous arthropods, initial encounter: Secondary | ICD-10-CM

## 2020-06-23 DIAGNOSIS — Q039 Congenital hydrocephalus, unspecified: Secondary | ICD-10-CM

## 2020-06-23 DIAGNOSIS — G2581 Restless legs syndrome: Secondary | ICD-10-CM

## 2020-06-23 DIAGNOSIS — S30860A Insect bite (nonvenomous) of lower back and pelvis, initial encounter: Secondary | ICD-10-CM | POA: Diagnosis not present

## 2020-06-23 DIAGNOSIS — R413 Other amnesia: Secondary | ICD-10-CM | POA: Diagnosis not present

## 2020-06-23 DIAGNOSIS — F320 Major depressive disorder, single episode, mild: Secondary | ICD-10-CM

## 2020-06-23 DIAGNOSIS — R7303 Prediabetes: Secondary | ICD-10-CM | POA: Diagnosis not present

## 2020-06-23 MED ORDER — DOXYCYCLINE HYCLATE 100 MG PO TABS: 100.0000 mg | ORAL_TABLET | Freq: Two times a day (BID) | ORAL | 0 refills | Status: AC

## 2020-06-23 NOTE — Progress Notes (Signed)
Subjective:  Patient ID: [name], male    DOB: 1965-11-30  Age: 55 y.o. MRN: [id number]  Chief Complaint  Patient presents with   Hypertension   Depression   Prediabetes    HPI Hypertension: Patient is taking lisinopril 20 mg daily, aspirin 81 mg daily. Prediabetes: Currently taking rybelsus 7 mg daily and eating healthy and exercise. Has consistently lost weight over this last year.  Depression: Patient takes Effexor 75 mg 3 daily, clonazpeam, melatonin.[facility]. RLS: on mirapex.  Adrenal insufficiency: on florinef 0.5 mg once daily.  Seizures: on kepra 1000 mg one twice a day. 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  Memory disturbance: on namenda 10 mg one twice a day. Father is here with him and feels it may help some.   Current Outpatient Medications on File Prior to Visit  Medication Sig Dispense Refill   Semaglutide (RYBELSUS) 7 MG TABS Take 7 mg by mouth daily.     aspirin 81 MG tablet Take 81 mg by mouth daily.     clonazePAM (KLONOPIN) 0.5 MG tablet Take 1 tablet by mouth once daily 30 tablet 3   fludrocortisone (FLORINEF) 0.1 MG tablet Take 0.2 mg by mouth 2 (two) times daily.     hydrocortisone (CORTEF) 10 MG tablet Take 10 mg by mouth daily.     levETIRAcetam (KEPPRA) 1000 MG tablet Take 1 tablet (1,000 mg total) by mouth 2 (two) times daily. 180 tablet 3   lisinopril (ZESTRIL) 20 MG tablet Take 1 tablet (20 mg total) by mouth daily. 90 tablet 3   Melatonin 5 MG TABS Take by mouth at bedtime.     memantine (NAMENDA) 10 MG tablet Take 1 tablet by mouth twice daily 180 tablet 1   pramipexole (MIRAPEX) 0.125 MG tablet Take 1 tablet (0.125 mg total) by mouth 2 (two) times daily. 180 tablet 3   Sod Fluoride-Potassium Nitrate 1.1-5 % PSTE Place 1 application onto teeth 2 (two) times daily.     SODIUM CHLORIDE PO Take 1,000 mg by mouth in the morning, at noon, and at bedtime.     venlafaxine XR (EFFEXOR-XR) 75 MG 24 hr capsule TAKE 3 CAPSULES BY MOUTH ONCE  DAILY 270 capsule 0   No current facility-administered medications on file prior to visit.   Past Medical History:  Diagnosis Date   Anxiety    Aqueductal stenosis (HCC)    Atrial fibrillation (HCC)    Cardiomyopathy    Carpal tunnel syndrome    Chiari I malformation ([place])    Coronary artery disease    Depression    Headache(784.0)    Hydrocephalus (HCC)    Hypertension    Memory loss    Obstructive sleep apnea    Pulmonary embolism (HCC)    Renal calculi    Restless leg syndrome    RLS (restless legs syndrome) 05/04/2020   Salt-wasting syndrome of infancy    Seizure (HCC)    SIADH (syndrome of inappropriate ADH production) ([place])    Syncope    Recurrent, thought to be neurally mediated   Past Surgical History:  Procedure Laterality Date   CHOLECYSTECTOMY     LOOP RECORDER EXPLANT N/A 10/30/2012   Procedure: LOOP RECORDER EXPLANT;  Surgeon: Deboraha Sprang, MD;  Location: [facility] CATH LAB;  Service: Cardiovascular;  Laterality: N/A;   Nissen Fundoplasty     PROSTATE SURGERY     St. Jude DM [phone] loop     S/p loop recorder   VENTRICULOPERITONEAL SHUNT Bilateral  Family History  Problem Relation Age of Onset   Migraines Mother    CAD Mother    Hypertension Mother    Endometriosis Sister    Seizures Neg Hx    Social History   Socioeconomic History   Marital status: Married    Spouse name: [name]   Number of children: 0   Years of education: 12   Highest education level: Not on file  Occupational History   Occupation: CUSTOMER SERVICE    Employer: [id number]    Comment: Not working   Tobacco Use   Smoking status: Never   Smokeless tobacco: Never  Substance and Sexual Activity   Alcohol use: No    Alcohol/week: 0.0 standard drinks   Drug use: No   Sexual activity: Not on file  Other Topics Concern   Not on file  Social History Narrative   Patient is married [name]), no children   Patient is right handed   Education level is high school graduate    Drinks caffeine free drinks   Social Determinants of Radio broadcast assistant Strain: Not on file  Food Insecurity: Not on file  Transportation Needs: Not on file  Physical Activity: Not on file  Stress: Not on file  Social Connections: Not on file    Review of Systems  Constitutional:  Negative for chills, fatigue, fever and unexpected weight change.  HENT:  Negative for congestion, ear pain, sinus pain and sore throat.   Cardiovascular:  Negative for chest pain and palpitations.  Gastrointestinal:  Negative for abdominal pain, blood in stool, constipation, diarrhea, nausea and vomiting.  Endocrine: Negative for polydipsia.  Genitourinary:  Negative for dysuria.       Poor bladder control  Musculoskeletal:  Negative for back pain.  Skin:  Negative for rash.       Tick bite on the back.  Neurological:  Negative for headaches.  Psychiatric/Behavioral:  Negative for dysphoric mood. The patient is nervous/anxious.     Objective:  BP 136/84   Pulse 72   Temp 98 F (36.7 C)   Resp 16   Ht 6\' 4"  (1.93 m)   Wt (!) 351 lb (159.2 kg)   BMI 42.73 kg/m   BP/Weight 06/23/2020 05/04/2020 6/38/7564  Systolic BP 332 951 884  Diastolic BP 84 166 72  Wt. (Lbs) 351 363 361  BMI 42.73 44.19 42.81    Physical Exam Constitutional:      Appearance: Normal appearance.  Cardiovascular:     Rate and Rhythm: Normal rate and regular rhythm.     Pulses: Normal pulses.     Heart sounds: Normal heart sounds.  Pulmonary:     Effort: Pulmonary effort is normal.     Breath sounds: Normal breath sounds.  Abdominal:     General: Bowel sounds are normal.     Palpations: Abdomen is soft. There is no mass.     Tenderness: There is no abdominal tenderness.  Skin:         Comments: Redness on the right side of the lumbar area.  Neurological:     Mental Status: He is alert.  Psychiatric:        Mood and Affect: Mood normal.        Behavior: Behavior normal.        Thought Content: Thought  content normal.    Diabetic Foot Exam - Simple   No data filed      Lab Results  Component Value Date  WBC 9.2 06/23/2020   HGB 15.1 06/23/2020   HCT 46.2 06/23/2020   PLT 339 06/23/2020   GLUCOSE 95 06/23/2020   CHOL 132 06/23/2020   TRIG 115 06/23/2020   HDL 35 (L) 06/23/2020   LDLCALC 76 06/23/2020   ALT 21 06/23/2020   AST 18 06/23/2020   NA 137 06/23/2020   K 5.2 06/23/2020   CL 99 06/23/2020   CREATININE 0.96 06/23/2020   BUN 8 06/23/2020   CO2 24 06/23/2020   TSH 2.440 03/11/2019   INR 1.02 08/07/2011   HGBA1C 6.1 (H) 06/23/2020      Assessment & Plan:   1. Essential hypertension Well controlled.  No changes to medicines.  Continue to work on eating a healthy diet and exercise.  Labs drawn today.  - Comprehensive metabolic panel - CBC with Differential/Platelet  2. Prediabetes Medicines: continue rybelsus. Continue to work on eating a healthy diet and exercise.  Labs drawn today.   - Hemoglobin A1c  3. Adrenal insufficiency ([place]) Mgmt by neurology.   4. RLS (restless legs syndrome) Mgmt by neurology.   5. Major depressive disorder, single episode, mild (HCC) The current medical regimen is effective;  continue present plan and medications. Management per specialist. Psychiatry.  6. Morbid obesity ([place]) Recommend continue to work on eating healthy diet and exercise.  7. Other hyperlipidemia Well controlled.  No changes to medicines.  Continue to work on eating a healthy diet and exercise.  Labs drawn today.  - Lipid panel  8. Tick bite of back, initial encounter - Doxycycline 100 mg 1 tablet twice a day for 1 day. Refill:0  Prophylactic dose given.  9. Congenital hydrocephalus.  Av shut. Pt is managed by neurology.   10. Seizures: well controlled.   11. Memory loss. Continue namenda as he has shown benefit.    Meds ordered this encounter  Medications   doxycycline (VIBRA-TABS) 100 MG tablet    Sig: Take 1 tablet (100 mg total)  by mouth 2 (two) times daily for 1 day.    Dispense:  2 tablet    Refill:  0    Orders Placed This Encounter  Procedures   Comprehensive metabolic panel   Hemoglobin A1c   Lipid panel   CBC with Differential/Platelet   Cardiovascular Risk Assessment   I,Carleton Vanvalkenburgh,acting as a scribe for Rochel Brome, MD.,have documented all relevant documentation on the behalf of Rochel Brome, MD,as directed by  Rochel Brome, MD while in the presence of Rochel Brome, MD.   Follow-up: Return in about 4 months (around 10/23/2020) for fasting.  An After Visit Summary was printed and given to the patient.  Rochel Brome, MD [facility] [phone]

## 2020-06-24 LAB — COMPREHENSIVE METABOLIC PANEL
ALT: 21 IU/L (ref 0–44)
AST: 18 IU/L (ref 0–40)
Albumin/Globulin Ratio: 1.5 (ref 1.2–2.2)
Albumin: 4.2 g/dL (ref 3.8–4.9)
Alkaline Phosphatase: 83 IU/L (ref 44–121)
BUN/Creatinine Ratio: 8 — ABNORMAL LOW (ref 9–20)
BUN: 8 mg/dL (ref 6–24)
Bilirubin Total: 0.9 mg/dL (ref 0.0–1.2)
CO2: 24 mmol/L (ref 20–29)
Calcium: 9.6 mg/dL (ref 8.7–10.2)
Chloride: 99 mmol/L (ref 96–106)
Creatinine, Ser: 0.96 mg/dL (ref 0.76–1.27)
Globulin, Total: 2.8 g/dL (ref 1.5–4.5)
Glucose: 95 mg/dL (ref 65–99)
Potassium: 5.2 mmol/L (ref 3.5–5.2)
Sodium: 137 mmol/L (ref 134–144)
Total Protein: 7 g/dL (ref 6.0–8.5)
eGFR: 93 mL/min/{1.73_m2} (ref >59)

## 2020-06-24 LAB — CBC WITH DIFFERENTIAL/PLATELET
Basophils Absolute: 0.1 10*3/uL (ref 0.0–0.2)
Basos: 1 %
EOS (ABSOLUTE): 0.1 10*3/uL (ref 0.0–0.4)
Eos: 2 %
Hematocrit: 46.2 % (ref 37.5–51.0)
Hemoglobin: 15.1 g/dL (ref 13.0–17.7)
Immature Grans (Abs): 0 10*3/uL (ref 0.0–0.1)
Immature Granulocytes: 0 %
Lymphocytes Absolute: 1.4 10*3/uL (ref 0.7–3.1)
Lymphs: 16 %
MCH: 27.8 pg (ref 26.6–33.0)
MCHC: 32.7 g/dL (ref 31.5–35.7)
MCV: 85 fL (ref 79–97)
Monocytes Absolute: 1 10*3/uL — ABNORMAL HIGH (ref 0.1–0.9)
Monocytes: 11 %
Neutrophils Absolute: 6.5 10*3/uL (ref 1.4–7.0)
Neutrophils: 70 %
Platelets: 339 10*3/uL (ref 150–450)
RBC: 5.43 x10E6/uL (ref 4.14–5.80)
RDW: 13.9 % (ref 11.6–15.4)
WBC: 9.2 10*3/uL (ref 3.4–10.8)

## 2020-06-24 LAB — LIPID PANEL
Chol/HDL Ratio: 3.8 ratio (ref 0.0–5.0)
Cholesterol, Total: 132 mg/dL (ref 100–199)
HDL: 35 mg/dL — ABNORMAL LOW (ref >39)
LDL Chol Calc (NIH): 76 mg/dL (ref 0–99)
Triglycerides: 115 mg/dL (ref 0–149)
VLDL Cholesterol Cal: 21 mg/dL (ref 5–40)

## 2020-06-24 LAB — HEMOGLOBIN A1C
Est. average glucose Bld gHb Est-mCnc: 128 mg/dL
Hgb A1c MFr Bld: 6.1 % — ABNORMAL HIGH (ref 4.8–5.6)

## 2020-06-24 LAB — CARDIOVASCULAR RISK ASSESSMENT

## 2020-07-05 ENCOUNTER — Encounter: Payer: Self-pay | Admitting: Family Medicine

## 2020-10-24 ENCOUNTER — Other Ambulatory Visit: Payer: Self-pay | Admitting: Family Medicine

## 2020-10-24 DIAGNOSIS — F320 Major depressive disorder, single episode, mild: Secondary | ICD-10-CM

## 2020-10-25 ENCOUNTER — Ambulatory Visit (INDEPENDENT_AMBULATORY_CARE_PROVIDER_SITE_OTHER): Payer: PPO

## 2020-10-25 ENCOUNTER — Encounter: Payer: Self-pay | Admitting: Family Medicine

## 2020-10-25 ENCOUNTER — Other Ambulatory Visit: Payer: Self-pay

## 2020-10-25 ENCOUNTER — Ambulatory Visit (INDEPENDENT_AMBULATORY_CARE_PROVIDER_SITE_OTHER): Payer: PPO | Admitting: Family Medicine

## 2020-10-25 VITALS — BP 138/88 | HR 72 | Temp 97.1°F | Resp 18 | Ht 76.0 in | Wt 337.0 lb

## 2020-10-25 DIAGNOSIS — G2581 Restless legs syndrome: Secondary | ICD-10-CM

## 2020-10-25 DIAGNOSIS — E291 Testicular hypofunction: Secondary | ICD-10-CM | POA: Diagnosis not present

## 2020-10-25 DIAGNOSIS — Z23 Encounter for immunization: Secondary | ICD-10-CM

## 2020-10-25 DIAGNOSIS — E871 Hypo-osmolality and hyponatremia: Secondary | ICD-10-CM | POA: Diagnosis not present

## 2020-10-25 DIAGNOSIS — R7303 Prediabetes: Secondary | ICD-10-CM

## 2020-10-25 DIAGNOSIS — E2740 Unspecified adrenocortical insufficiency: Secondary | ICD-10-CM | POA: Diagnosis not present

## 2020-10-25 DIAGNOSIS — I10 Essential (primary) hypertension: Secondary | ICD-10-CM

## 2020-10-25 DIAGNOSIS — Q039 Congenital hydrocephalus, unspecified: Secondary | ICD-10-CM

## 2020-10-25 DIAGNOSIS — F330 Major depressive disorder, recurrent, mild: Secondary | ICD-10-CM | POA: Diagnosis not present

## 2020-10-25 DIAGNOSIS — Z6841 Body Mass Index (BMI) 40.0 and over, adult: Secondary | ICD-10-CM

## 2020-10-25 DIAGNOSIS — R413 Other amnesia: Secondary | ICD-10-CM

## 2020-10-25 DIAGNOSIS — R569 Unspecified convulsions: Secondary | ICD-10-CM

## 2020-10-25 LAB — CBC WITH DIFFERENTIAL/PLATELET
Basophils Absolute: 0.1 10*3/uL (ref 0.0–0.2)
Basos: 1 %
EOS (ABSOLUTE): 0.2 10*3/uL (ref 0.0–0.4)
Eos: 2 %
Hematocrit: 45.5 % (ref 37.5–51.0)
Hemoglobin: 15.3 g/dL (ref 13.0–17.7)
Immature Grans (Abs): 0 10*3/uL (ref 0.0–0.1)
Immature Granulocytes: 0 %
Lymphocytes Absolute: 1.8 10*3/uL (ref 0.7–3.1)
Lymphs: 18 %
MCH: 28.2 pg (ref 26.6–33.0)
MCHC: 33.6 g/dL (ref 31.5–35.7)
MCV: 84 fL (ref 79–97)
Monocytes Absolute: 0.9 10*3/uL (ref 0.1–0.9)
Monocytes: 10 %
Neutrophils Absolute: 6.9 10*3/uL (ref 1.4–7.0)
Neutrophils: 69 %
Platelets: 347 10*3/uL (ref 150–450)
RBC: 5.43 x10E6/uL (ref 4.14–5.80)
RDW: 13.9 % (ref 11.6–15.4)
WBC: 9.9 10*3/uL (ref 3.4–10.8)

## 2020-10-25 LAB — COMPREHENSIVE METABOLIC PANEL
ALT: 25 IU/L (ref 0–44)
AST: 26 IU/L (ref 0–40)
Albumin/Globulin Ratio: 1.5 (ref 1.2–2.2)
Albumin: 4 g/dL (ref 3.8–4.9)
Alkaline Phosphatase: 84 IU/L (ref 44–121)
BUN/Creatinine Ratio: 13 (ref 9–20)
BUN: 12 mg/dL (ref 6–24)
Bilirubin Total: 1.1 mg/dL (ref 0.0–1.2)
CO2: 24 mmol/L (ref 20–29)
Calcium: 9.3 mg/dL (ref 8.7–10.2)
Chloride: 99 mmol/L (ref 96–106)
Creatinine, Ser: 0.89 mg/dL (ref 0.76–1.27)
Globulin, Total: 2.7 g/dL (ref 1.5–4.5)
Glucose: 96 mg/dL (ref 70–99)
Potassium: 4.8 mmol/L (ref 3.5–5.2)
Sodium: 136 mmol/L (ref 134–144)
Total Protein: 6.7 g/dL (ref 6.0–8.5)
eGFR: 101 mL/min/{1.73_m2} (ref >59)

## 2020-10-25 LAB — HEMOGLOBIN A1C
Est. average glucose Bld gHb Est-mCnc: 128 mg/dL
Hgb A1c MFr Bld: 6.1 % — ABNORMAL HIGH (ref 4.8–5.6)

## 2020-10-25 NOTE — Progress Notes (Signed)
Subjective:  Patient ID: [name], male    DOB: 1965-06-30  Age: 55 y.o. MRN: [id number]  Chief Complaint  Patient presents with   Obesity    HPI Patient is a 55 year old white male with past medical history of prediabetes, seizure disorder hypertension, morbid obesity, congenital hydrocephalus, adrenal insufficiency hyponatremia, and memory loss.  Patient is doing very well.  He is working hard on his diet.  With the help of Rybelsus 3 mg once daily he has lost nearly 35 pounds in the last year.  Patient sees [name], [place] for endocrinology.  He sees Dr. Jannifer [name] for neurology.  Patient has had no recent seizures.  Currently on Keppra 1000 mg twice daily. Has depression with anxiety which is well controlled on Effexor extended release 75 mg 3 capsules in AM.  Patient has not been needing his clonazepam 0.5 mg once daily as needed for anxiety. Restless leg syndrome currently on Mirapex 0.125 mg 1 tablet twice daily. Hypertension is well controlled on lisinopril 20 mg once daily.  On aspirin 81 mg once daily. Adrenal insufficiency/hyponatremia: Currently patient is on hydrocortisone 10 mg once daily and sodium chloride 1000 mg 3 times a day. Memory loss: Currently on Namenda 10 mg twice daily. Current Outpatient Medications on File Prior to Visit  Medication Sig Dispense Refill   aspirin 81 MG tablet Take 81 mg by mouth daily.     clonazePAM (KLONOPIN) 0.5 MG tablet Take 1 tablet by mouth once daily 30 tablet 3   fludrocortisone (FLORINEF) 0.1 MG tablet Take 0.2 mg by mouth 2 (two) times daily.     hydrocortisone (CORTEF) 10 MG tablet Take 10 mg by mouth daily.     levETIRAcetam (KEPPRA) 1000 MG tablet Take 1 tablet (1,000 mg total) by mouth 2 (two) times daily. 180 tablet 3   lisinopril (ZESTRIL) 20 MG tablet Take 1 tablet (20 mg total) by mouth daily. 90 tablet 3   Melatonin 5 MG TABS Take by mouth at bedtime.     memantine (NAMENDA) 10 MG tablet Take 1 tablet by mouth twice  daily 180 tablet 1   pramipexole (MIRAPEX) 0.125 MG tablet Take 1 tablet (0.125 mg total) by mouth 2 (two) times daily. 180 tablet 3   Semaglutide (RYBELSUS) 7 MG TABS Take 7 mg by mouth daily.     SODIUM CHLORIDE PO Take 1,000 mg by mouth in the morning, at noon, and at bedtime.     venlafaxine XR (EFFEXOR-XR) 75 MG 24 hr capsule TAKE 3 CAPSULES BY MOUTH ONCE DAILY 270 capsule 0   No current facility-administered medications on file prior to visit.   Past Medical History:  Diagnosis Date   Anxiety    Aqueductal stenosis (HCC)    Atrial fibrillation (HCC)    Cardiomyopathy    Carpal tunnel syndrome    Chiari I malformation (HCC)    Coronary artery disease    Depression    Headache(784.0)    Hydrocephalus (HCC)    Hypertension    Memory loss    Obstructive sleep apnea    Pulmonary embolism (HCC)    Renal calculi    Restless leg syndrome    RLS (restless legs syndrome) 05/04/2020   Salt-wasting syndrome of infancy    Seizure (HCC)    SIADH (syndrome of inappropriate ADH production) ([place])    Syncope    Recurrent, thought to be neurally mediated   Past Surgical History:  Procedure Laterality Date   CHOLECYSTECTOMY  LOOP RECORDER EXPLANT N/A 10/30/2012   Procedure: LOOP RECORDER EXPLANT;  Surgeon: Deboraha Sprang, MD;  Location: [facility] CATH LAB;  Service: Cardiovascular;  Laterality: N/A;   Nissen Fundoplasty     PROSTATE SURGERY     [employer] DM [phone] loop     S/p loop recorder   VENTRICULOPERITONEAL SHUNT Bilateral     Family History  Problem Relation Age of Onset   Migraines Mother    CAD Mother    Hypertension Mother    Endometriosis Sister    Seizures Neg Hx    Social History   Socioeconomic History   Marital status: Married    Spouse name: [name]   Number of children: 0   Years of education: 12   Highest education level: Not on file  Occupational History   Occupation: CUSTOMER SERVICE    Employer: [id number]    Comment: Not working   Tobacco Use   Smoking  status: Never   Smokeless tobacco: Never  Substance and Sexual Activity   Alcohol use: No    Alcohol/week: 0.0 standard drinks   Drug use: No   Sexual activity: Not on file  Other Topics Concern   Not on file  Social History Narrative   Patient is married [name]), no children   Patient is right handed   Education level is high school graduate   Drinks caffeine free drinks   Social Determinants of Radio broadcast assistant Strain: Not on file  Food Insecurity: Not on file  Transportation Needs: Not on file  Physical Activity: Not on file  Stress: Not on file  Social Connections: Not on file    Review of Systems  Constitutional:  Positive for unexpected weight change (trying to lose weight). Negative for chills and fever.  HENT:  Negative for congestion, rhinorrhea and sore throat.   Respiratory:  Negative for cough and shortness of breath.   Cardiovascular:  Negative for chest pain and palpitations.  Gastrointestinal:  Negative for abdominal pain, constipation, diarrhea, nausea and vomiting.  Endocrine: Positive for polydipsia and polyphagia.  Genitourinary:  Negative for dysuria and urgency.  Musculoskeletal:  Negative for arthralgias, back pain and myalgias.  Neurological:  Negative for dizziness and headaches.  Psychiatric/Behavioral:  Negative for dysphoric mood. The patient is not nervous/anxious.     Objective:  BP 138/88   Pulse 72   Temp (!) 97.1 F (36.2 C)   Resp 18   Ht 6\' 4"  (1.93 m)   Wt (!) 337 lb (152.9 kg)   BMI 41.02 kg/m   BP/Weight 10/25/2020 06/23/2020 8/50/2774  Systolic BP 128 786 767  Diastolic BP 88 84 209  Wt. (Lbs) 337 351 363  BMI 41.02 42.73 44.19    Physical Exam Vitals reviewed.  Constitutional:      Appearance: Normal appearance. He is obese.  Neck:     Vascular: No carotid bruit.  Cardiovascular:     Rate and Rhythm: Normal rate and regular rhythm.     Pulses: Normal pulses.     Heart sounds: Normal heart sounds.   Pulmonary:     Effort: Pulmonary effort is normal.     Breath sounds: Normal breath sounds. No wheezing, rhonchi or rales.  Abdominal:     General: Bowel sounds are normal.     Palpations: Abdomen is soft.     Tenderness: There is no abdominal tenderness.  Neurological:     Mental Status: He is alert.  Psychiatric:  Mood and Affect: Mood normal.        Behavior: Behavior normal.    Diabetic Foot Exam - Simple   No data filed      Lab Results  Component Value Date   WBC 9.9 10/25/2020   HGB 15.3 10/25/2020   HCT 45.5 10/25/2020   PLT 347 10/25/2020   GLUCOSE 96 10/25/2020   CHOL 132 06/23/2020   TRIG 115 06/23/2020   HDL 35 (L) 06/23/2020   LDLCALC 76 06/23/2020   ALT 25 10/25/2020   AST 26 10/25/2020   NA 136 10/25/2020   K 4.8 10/25/2020   CL 99 10/25/2020   CREATININE 0.89 10/25/2020   BUN 12 10/25/2020   CO2 24 10/25/2020   TSH 2.440 03/11/2019   INR 1.02 08/07/2011   HGBA1C 6.1 (H) 10/25/2020      Assessment & Plan:   Problem List Items Addressed This Visit       Cardiovascular and Mediastinum   Essential hypertension - Primary    Well-controlled on lisinopril 20 mg once daily.  Continue aspirin 81 mg once daily.      Relevant Orders   CBC with Differential/Platelet (Completed)   Comprehensive metabolic panel (Completed)     Endocrine   Adrenal insufficiency ([place])    Management per endocrinology.  Currently on hydrocortisone 10 mg once daily and sodium chloride 1000 mg 3 times a day.      Hypogonadism, male     Nervous and Auditory   Congenital hydrocephalus [facility])    Patient has a ventriculoperitoneal shunt.  Patient follows with Dr. Sabra Heck.  No seizures in years.  Continue on Keppra 1000 mg twice daily.        Other   Memory loss    Continue Namenda 10 mg twice daily.  Management per Dr. Jannifer [name].      Seizures ([place])    Management per Dr. Jannifer [name].  Continue Keppra 1000 mg twice daily.      Hyponatremia    Compensated.   Continue sodium chloride 1000 mg 3 times a day.      Prediabetes    Continue Rybelsus 3 mg once daily.  Management per endocrinology.      Relevant Orders   Hemoglobin A1c (Completed)   Morbid obesity ([place])    Patient is doing very well on his weight loss.  Recommended continue eating healthy.  Continue exercise. Rybelsus has helped significantly.  He receives this from endocrinology.      Mild recurrent major depression (HCC)    Well-controlled on Effexor XR 75 mg 3 daily in AM.   Clonazepam removed from his list of medications as he does not use.      RLS (restless legs syndrome)    Well-controlled on Mirapex 0.125 mg once daily at night.      BMI 40.0-44.9, adult ([place])   Other Visit Diagnoses     Need for influenza vaccination       Relevant Orders   Flu Vaccine MDCK QUAD PF (Completed)     .  No orders of the defined types were placed in this encounter.   Orders Placed This Encounter  Procedures   Flu Vaccine MDCK QUAD PF   CBC with Differential/Platelet   Comprehensive metabolic panel   Hemoglobin A1c     Follow-up: Return in about 6 months (around 04/25/2021) for chronic fasting, awv needed this fall with Shelle Iron, LPN. Marland Kitchen  An After Visit Summary was printed and given to the patient.  Rochel Brome, MD [facility] [phone]

## 2020-10-26 NOTE — Progress Notes (Signed)
Blood count normal.  Liver function normal.  Kidney function normal.  HBA1C: 6.1. Stable.

## 2020-11-06 ENCOUNTER — Encounter: Payer: Self-pay | Admitting: Family Medicine

## 2020-11-06 NOTE — Assessment & Plan Note (Addendum)
Compensated.  Continue sodium chloride 1000 mg 3 times a day.

## 2020-11-06 NOTE — Assessment & Plan Note (Signed)
Continue Namenda 10 mg twice daily.  Management per Dr. [name].

## 2020-11-06 NOTE — Assessment & Plan Note (Signed)
Well-controlled on Effexor XR 75 mg 3 daily in AM.   Clonazepam removed from his list of medications as he does not use.

## 2020-11-06 NOTE — Assessment & Plan Note (Signed)
Patient has a ventriculoperitoneal shunt.  Patient follows with Dr. Sabra Heck.  No seizures in years.  Continue on Keppra 1000 mg twice daily.

## 2020-11-06 NOTE — Assessment & Plan Note (Signed)
Management per Dr. [name].  Continue Keppra 1000 mg twice daily.

## 2020-11-06 NOTE — Assessment & Plan Note (Signed)
Management per endocrinology.  Currently on hydrocortisone 10 mg once daily and sodium chloride 1000 mg 3 times a day.

## 2020-11-06 NOTE — Assessment & Plan Note (Signed)
Well-controlled on lisinopril 20 mg once daily.  Continue aspirin 81 mg once daily.

## 2020-11-06 NOTE — Assessment & Plan Note (Signed)
Patient is doing very well on his weight loss.  Recommended continue eating healthy.  Continue exercise. Rybelsus has helped significantly.  He receives this from endocrinology.

## 2020-11-06 NOTE — Assessment & Plan Note (Signed)
Continue Rybelsus 3 mg once daily.  Management per endocrinology.

## 2020-11-06 NOTE — Assessment & Plan Note (Signed)
Well-controlled on Mirapex 0.125 mg once daily at night.

## 2020-11-15 ENCOUNTER — Other Ambulatory Visit: Payer: Self-pay

## 2020-11-15 ENCOUNTER — Ambulatory Visit (INDEPENDENT_AMBULATORY_CARE_PROVIDER_SITE_OTHER): Payer: PPO

## 2020-11-15 VITALS — BP 132/82 | HR 77 | Resp 16 | Ht 76.0 in | Wt 333.0 lb

## 2020-11-15 DIAGNOSIS — Z0000 Encounter for general adult medical examination without abnormal findings: Secondary | ICD-10-CM | POA: Diagnosis not present

## 2020-11-18 NOTE — Patient Instructions (Signed)
Health Maintenance, Male Adopting a healthy lifestyle and getting preventive care are important in promoting health and wellness. Ask your health care provider about: The right schedule for you to have regular tests and exams. Things you can do on your own to prevent diseases and keep yourself healthy. What should I know about diet, weight, and exercise? Eat a healthy diet  Eat a diet that includes plenty of vegetables, fruits, low-fat dairy products, and lean protein. Do not eat a lot of foods that are high in solid fats, added sugars, or sodium. Maintain a healthy weight Body mass index (BMI) is a measurement that can be used to identify possible weight problems. It estimates body fat based on height and weight. Your health care provider can help determine your BMI and help you achieve or maintain a healthy weight. Get regular exercise Get regular exercise. This is one of the most important things you can do for your health. Most adults should: Exercise for at least 150 minutes each week. The exercise should increase your heart rate and make you sweat (moderate-intensity exercise). Do strengthening exercises at least twice a week. This is in addition to the moderate-intensity exercise. Spend less time sitting. Even light physical activity can be beneficial. Watch cholesterol and blood lipids Have your blood tested for lipids and cholesterol at 56 years of age, then have this test every 5 years. You may need to have your cholesterol levels checked more often if: Your lipid or cholesterol levels are high. You are older than 56 years of age. You are at high risk for heart disease. What should I know about cancer screening? Many types of cancers can be detected early and may often be prevented. Depending on your health history and family history, you may need to have cancer screening at various ages. This may include screening for: Colorectal cancer. Prostate cancer. Skin cancer. Lung  cancer. What should I know about heart disease, diabetes, and high blood pressure? Blood pressure and heart disease High blood pressure causes heart disease and increases the risk of stroke. This is more likely to develop in people who have high blood pressure readings, are of African descent, or are overweight. Talk with your health care provider about your target blood pressure readings. Have your blood pressure checked: Every 3-5 years if you are 18-39 years of age. Every year if you are 40 years old or older. If you are between the ages of 65 and 75 and are a current or former smoker, ask your health care provider if you should have a one-time screening for abdominal aortic aneurysm (AAA). Diabetes Have regular diabetes screenings. This checks your fasting blood sugar level. Have the screening done: Once every three years after age 45 if you are at a normal weight and have a low risk for diabetes. More often and at a younger age if you are overweight or have a high risk for diabetes. What should I know about preventing infection? Hepatitis B If you have a higher risk for hepatitis B, you should be screened for this virus. Talk with your health care provider to find out if you are at risk for hepatitis B infection. Hepatitis C Blood testing is recommended for: Everyone born from 1945 through 1965. Anyone with known risk factors for hepatitis C. Sexually transmitted infections (STIs) You should be screened each year for STIs, including gonorrhea and chlamydia, if: You are sexually active and are younger than 56 years of age. You are older than 56 years   of age and your health care provider tells you that you are at risk for this type of infection. Your sexual activity has changed since you were last screened, and you are at increased risk for chlamydia or gonorrhea. Ask your health care provider if you are at risk. Ask your health care provider about whether you are at high risk for HIV.  Your health care provider may recommend a prescription medicine to help prevent HIV infection. If you choose to take medicine to prevent HIV, you should first get tested for HIV. You should then be tested every 3 months for as long as you are taking the medicine. Follow these instructions at home: Lifestyle Do not use any products that contain nicotine or tobacco, such as cigarettes, e-cigarettes, and chewing tobacco. If you need help quitting, ask your health care provider. Do not use street drugs. Do not share needles. Ask your health care provider for help if you need support or information about quitting drugs. Alcohol use Do not drink alcohol if your health care provider tells you not to drink. If you drink alcohol: Limit how much you have to 0-2 drinks a day. Be aware of how much alcohol is in your drink. In the [place] one drink equals one 12 oz bottle of beer (355 mL), one 5 oz glass of wine (148 mL), or one 1 oz glass of hard liquor (44 mL). General instructions Schedule regular health, dental, and eye exams. Stay current with your vaccines. Tell your health care provider if: You often feel depressed. You have ever been abused or do not feel safe at home. Summary Adopting a healthy lifestyle and getting preventive care are important in promoting health and wellness. Follow your health care provider's instructions about healthy diet, exercising, and getting tested or screened for diseases. Follow your health care provider's instructions on monitoring your cholesterol and blood pressure. This information is not intended to replace advice given to you by your health care provider. Make sure you discuss any questions you have with your health care provider. Document Revised: 03/19/2020 Document Reviewed: 01/02/2018 Elsevier Patient Education  2022 [employer]  

## 2020-11-18 NOTE — Progress Notes (Signed)
Subjective:   [name] is a 55 y.o. male who presents for Medicare Annual/Subsequent preventive examination.  This wellness visit is conducted by a nurse.  The patient's medications were reviewed and reconciled since the patient's last visit.  History details were provided by the patient.  The history appears to be reliable.    Patient's last AWV was one year ago.   Medical History: Patient history and Family history was reviewed  Medications, Allergies, and preventative health maintenance was reviewed and updated.   Review of Systems    ROS - Negative Cardiac Risk Factors include: male gender;obesity (BMI >30kg/m2)     Objective:    Today's Vitals   11/15/20 0955  BP: 132/82  Pulse: 77  Resp: 16  SpO2: 97%  Weight: (!) 333 lb (151 kg)  Height: 6\' 4"  (1.93 m)  PainSc: 0-No pain   Body mass index is 40.53 kg/m.  Advanced Directives 11/15/2020 02/24/2015 09/10/2014 05/07/2014 10/30/2012  Does Patient Have a Medical Advance Directive? No Yes Yes Yes Patient does not have advance directive  Type of Advance Directive - Healthcare Power of [place];Living will - -  Copy of [place] in Chart? - - No - copy requested No - copy requested -  Would patient like information on creating a medical advance directive? No - Patient declined - - - -  Pre-existing out of facility DNR order (yellow form or pink MOST form) - - - - No    Current Medications (verified) Outpatient Encounter Medications as of 11/15/2020  Medication Sig   aspirin 81 MG tablet Take 81 mg by mouth daily.   clonazePAM (KLONOPIN) 0.5 MG tablet Take 1 tablet by mouth once daily   fludrocortisone (FLORINEF) 0.1 MG tablet Take 0.2 mg by mouth 2 (two) times daily.   hydrocortisone (CORTEF) 10 MG tablet Take 10 mg by mouth daily.   levETIRAcetam (KEPPRA) 1000 MG tablet Take 1 tablet (1,000 mg total) by mouth 2 (two) times daily.   lisinopril (ZESTRIL) 20 MG tablet  Take 1 tablet (20 mg total) by mouth daily.   Melatonin 5 MG TABS Take by mouth at bedtime.   memantine (NAMENDA) 10 MG tablet Take 1 tablet by mouth twice daily   pramipexole (MIRAPEX) 0.125 MG tablet Take 1 tablet (0.125 mg total) by mouth 2 (two) times daily.   Semaglutide (RYBELSUS) 7 MG TABS Take 7 mg by mouth daily.   SODIUM CHLORIDE PO Take 1,000 mg by mouth in the morning, at noon, and at bedtime.   venlafaxine XR (EFFEXOR-XR) 75 MG 24 hr capsule TAKE 3 CAPSULES BY MOUTH ONCE DAILY   No facility-administered encounter medications on file as of 11/15/2020.    Allergies (verified) Omnipaque [iohexol] and Prednisone   History: Past Medical History:  Diagnosis Date   Anxiety    Aqueductal stenosis (HCC)    Atrial fibrillation (HCC)    Cardiomyopathy    Carpal tunnel syndrome    Chiari I malformation ([place])    Coronary artery disease    Depression    Headache(784.0)    Hydrocephalus (HCC)    Hypertension    Memory loss    Obstructive sleep apnea    Pulmonary embolism (HCC)    Renal calculi    Restless leg syndrome    RLS (restless legs syndrome) 05/04/2020   Salt-wasting syndrome of infancy    Seizure (HCC)    SIADH (syndrome of inappropriate ADH production) ([place])  Syncope    Recurrent, thought to be neurally mediated   Past Surgical History:  Procedure Laterality Date   CHOLECYSTECTOMY     LOOP RECORDER EXPLANT N/A 10/30/2012   Procedure: LOOP RECORDER EXPLANT;  Surgeon: Deboraha Sprang, MD;  Location: [facility] CATH LAB;  Service: Cardiovascular;  Laterality: N/A;   Nissen Fundoplasty     PROSTATE SURGERY     St. Jude DM [phone] loop     S/p loop recorder   VENTRICULOPERITONEAL SHUNT Bilateral    Family History  Problem Relation Age of Onset   Migraines Mother    CAD Mother    Hypertension Mother    Endometriosis Sister    Seizures Neg Hx    Social History   Socioeconomic History   Marital status: Widowed    Spouse name: [name]   Number of children: 0    Years of education: 12   Highest education level: Not on file  Occupational History   Occupation: CUSTOMER SERVICE    Employer: [id number]    Comment: Not working   Tobacco Use   Smoking status: Never   Smokeless tobacco: Never  Vaping Use   Vaping Use: Never used  Substance and Sexual Activity   Alcohol use: No    Alcohol/week: 0.0 standard drinks   Drug use: No   Sexual activity: Not Currently  Other Topics Concern   Not on file  Social History Narrative   Patient's wife passed away, no children   Patient is right handed   Education level is high school graduate   Drinks caffeine free drinks   Social Determinants of Radio broadcast assistant Strain: Not on file  Food Insecurity: Not on file  Transportation Needs: Not on file  Physical Activity: Not on file  Stress: Not on file  Social Connections: Not on file    Tobacco Counseling Counseling given: No tobacco products used by patient   Clinical Intake:  Pre-visit preparation completed: Yes Pain : No/denies pain Pain Score: 0-No pain   Nutritional Status: BMI > 30  Obese Nutritional Risks: None Diabetes: No (A1C 6.1 - Prediabetes) How often do you need to have someone help you when you read instructions, pamphlets, or other written materials from your doctor or pharmacy?: 4 - Often Interpreter Needed?: No   Activities of Daily Living In your present state of health, do you have any difficulty performing the following activities: 11/15/2020  Hearing? N  Vision? N  Difficulty concentrating or making decisions? Y  Walking or climbing stairs? N  Dressing or bathing? N  Doing errands, shopping? Y  Preparing Food and eating ? N  Using the Toilet? N  In the past six months, have you accidently leaked urine? N  Do you have problems with loss of bowel control? N  Managing your Medications? N  Managing your Finances? N  Housekeeping or managing your Housekeeping? N  Some recent data might be hidden    Patient  Care Team: Philip Brome, MD as PCP - General (Family Medicine) Philip Ducking, MD as Consulting Physician (Neurology) [name] (Internal Medicine)    Assessment:   This is a routine wellness examination for [place].  Hearing/Vision screen No results found.  Dietary issues and exercise activities discussed: Current Exercise Habits: Home exercise routine, Type of exercise: walking;strength training/weights, Time (Minutes): 45, Frequency (Times/Week): 5, Weekly Exercise (Minutes/Week): 225, Intensity: Mild, Exercise limited by: None identified  Depression Screen [facility] 2/9 Scores 11/15/2020 06/23/2020 08/13/2019 06/13/2019  PHQ - 2 Score 0 0 1 3  PHQ- 9 Score - 2 5 4     Fall Risk Fall Risk  11/15/2020 08/13/2019  Falls in the past year? 0 1  Number falls in past yr: 0 0  Injury with Fall? 0 0  Risk for fall due to : No Fall Risks -  Follow up Falls evaluation completed;Falls prevention discussed Falls prevention discussed;Falls evaluation completed    FALL RISK PREVENTION PERTAINING TO THE HOME:  Home free of loose throw rugs in walkways, pet beds, electrical cords, etc? Yes  Adequate lighting in your home to reduce risk of falls? Yes   ASSISTIVE DEVICES UTILIZED TO PREVENT FALLS:  Use of a cane, walker or w/c? No  Gait steady and fast without use of assistive device  Cognitive Function: MMSE - Mini Mental State Exam 08/13/2019 05/05/2019 11/04/2018 07/12/2017 01/10/2017  Not completed: - - (No Data) - -  Orientation to time 5 5 3 5 5   Orientation to Place 5 5 5 4 5   Registration 3 3 3 3 3   Attention/ Calculation 5 5 5 5 5   Recall 1 2 0 1 3  Language- name 2 objects 2 2 2 2 2   Language- repeat 1 1 1 1 1   Language- follow 3 step command 1 3 3 3 3   Language- read & follow direction 1 1 1 1 1   Write a sentence 1 1 1 1 1   Copy design 1 0 1 1 1   Copy design-comments - 25 animals - - -  Total score 26 28 25 27 30      6CIT Screen 11/15/2020  What Year? 0  points  What month? 0 points  What time? 0 points  Count back from 20 0 points  Months in reverse 2 points  Repeat phrase 2 points  Total Score 4    Immunizations Immunization History  Administered Date(s) Administered   Influenza Inj Mdck Quad Pf 10/21/2019, 10/25/2020   PFIZER(Purple Top)SARS-COV-2 Vaccination 04/24/2019, 05/23/2019, 12/15/2019   Pfizer Covid-19 Vaccine Bivalent Booster 26yrs & up 10/25/2020   Pneumococcal Polysaccharide-23 01/23/2006, 11/18/2019    TDAP status: Due, Education has been provided regarding the importance of this vaccine. Advised may receive this vaccine at local pharmacy or Health Dept. Aware to provide a copy of the vaccination record if obtained from local pharmacy or Health Dept. Verbalized acceptance and understanding.  Flu Vaccine status: Up to date  Pneumococcal vaccine status: Up to date  Covid-19 vaccine status: Completed vaccines  Screening Tests Health Maintenance  Topic Date Due   Hepatitis C Screening  Never done   TETANUS/TDAP  Never done   Zoster Vaccines- Shingrix (1 of 2) Never done   COLONOSCOPY (Pts 45-72yrs Insurance coverage will need to be confirmed)  11/16/2021   INFLUENZA VACCINE  Completed   COVID-19 Vaccine  Completed   HIV Screening  Completed   Pneumococcal Vaccine 41-47 Years old  Aged Out   HPV [place] Maintenance Due  Topic Date Due   Hepatitis C Screening  Never done   TETANUS/TDAP  Never done   Zoster Vaccines- Shingrix (1 of 2) Never done    Colorectal cancer screening: Type of screening: Colonoscopy. Completed 2018. Repeat every 5 years  Lung Cancer Screening: (Low Dose CT Chest recommended if Age 2-80 years, 30 pack-year currently smoking OR have quit w/in 15years.) does not qualify.   Additional Screening:  Vision Screening: Recommended annual ophthalmology  exams for early detection of glaucoma and other disorders of the eye. Is the patient up to date  with their annual eye exam?  Yes   Dental Screening: Recommended annual dental exams for proper oral hygiene    Plan:    1- Patient is taking Rybelsus - it is very expensive, patient assistance application completed 2- Continue healthy diet - limit fast food.  Continue daily exercise.  I have personally reviewed and noted the following in the patient's chart:   Medical and social history Use of alcohol, tobacco or illicit drugs  Current medications and supplements including opioid prescriptions. Patient is not currently taking opioid prescriptions. Functional ability and status Nutritional status Physical activity Advanced directives List of other physicians Hospitalizations, surgeries, and ER visits in previous 12 months Vitals Screenings to include cognitive, depression, and falls Referrals and appointments  In addition, I have reviewed and discussed with patient certain preventive protocols, quality metrics, and best practice recommendations. A written personalized care plan for preventive services as well as general preventive health recommendations were provided to patient.     Erie Noe, LPN   01/11/7587

## 2020-11-30 ENCOUNTER — Telehealth: Payer: Self-pay

## 2020-11-30 NOTE — Chronic Care Management (AMB) (Signed)
    Chronic Care Management Pharmacy Assistant   Name: [name]  MRN: [id number] DOB: Jan 08, 1966  Reason for Encounter: Patient Assistance Coordination  11/30/2020- 2023 Patient assistance renewal application filled out for Rybelsus  with [employer] Patient assistance program. Mailing application to patient to return to PCP office for Dr Cox sign. No answer, left message notifying mailing application and requested to sign and fill in all highlighted areas and return to PCP office.   Medications: Outpatient Encounter Medications as of 11/30/2020  Medication Sig   aspirin 81 MG tablet Take 81 mg by mouth daily.   clonazePAM (KLONOPIN) 0.5 MG tablet Take 1 tablet by mouth once daily   fludrocortisone (FLORINEF) 0.1 MG tablet Take 0.2 mg by mouth 2 (two) times daily.   hydrocortisone (CORTEF) 10 MG tablet Take 10 mg by mouth daily.   levETIRAcetam (KEPPRA) 1000 MG tablet Take 1 tablet (1,000 mg total) by mouth 2 (two) times daily.   lisinopril (ZESTRIL) 20 MG tablet Take 1 tablet (20 mg total) by mouth daily.   Melatonin 5 MG TABS Take by mouth at bedtime.   memantine (NAMENDA) 10 MG tablet Take 1 tablet by mouth twice daily   pramipexole (MIRAPEX) 0.125 MG tablet Take 1 tablet (0.125 mg total) by mouth 2 (two) times daily.   Semaglutide (RYBELSUS) 7 MG TABS Take 7 mg by mouth daily.   SODIUM CHLORIDE PO Take 1,000 mg by mouth in the morning, at noon, and at bedtime.   venlafaxine XR (EFFEXOR-XR) 75 MG 24 hr capsule TAKE 3 CAPSULES BY MOUTH ONCE DAILY   No facility-administered encounter medications on file as of 11/30/2020.   [name], [place] Pharmacist Assistant [phone]

## 2020-12-21 ENCOUNTER — Telehealth: Payer: Self-pay

## 2020-12-21 NOTE — Chronic Care Management (AMB) (Signed)
    Chronic Care Management Pharmacy Assistant   Name: SAHAND GOSCH  MRN: [id number] DOB: [birth year]  Reason for Encounter: Patient Assistance Coordination  12/21/2020- [name]  patient assistance program to check on the status of Rybelsus 7 mg, per automated system, medication is in process to be shipped, will need to check back in 1-2 business days to get shipping date. Called patient's father per office to inform, no answer, left message of shipment status and to return call regarding day supply patient has left of Rybelsus, will check if office has samples.   Medications: Outpatient Encounter Medications as of 12/21/2020  Medication Sig   aspirin 81 MG tablet Take 81 mg by mouth daily.   clonazePAM (KLONOPIN) 0.5 MG tablet Take 1 tablet by mouth once daily   fludrocortisone (FLORINEF) 0.1 MG tablet Take 0.2 mg by mouth 2 (two) times daily.   hydrocortisone (CORTEF) 10 MG tablet Take 10 mg by mouth daily.   levETIRAcetam (KEPPRA) 1000 MG tablet Take 1 tablet (1,000 mg total) by mouth 2 (two) times daily.   lisinopril (ZESTRIL) 20 MG tablet Take 1 tablet (20 mg total) by mouth daily.   Melatonin 5 MG TABS Take by mouth at bedtime.   memantine (NAMENDA) 10 MG tablet Take 1 tablet by mouth twice daily   pramipexole (MIRAPEX) 0.125 MG tablet Take 1 tablet (0.125 mg total) by mouth 2 (two) times daily.   Semaglutide (RYBELSUS) 7 MG TABS Take 7 mg by mouth daily.   SODIUM CHLORIDE PO Take 1,000 mg by mouth in the morning, at noon, and at bedtime.   venlafaxine XR (EFFEXOR-XR) 75 MG 24 hr capsule TAKE 3 CAPSULES BY MOUTH ONCE DAILY   No facility-administered encounter medications on file as of 12/21/2020.    [name], [place] Pharmacist Assistant [phone]

## 2020-12-27 ENCOUNTER — Other Ambulatory Visit: Payer: Self-pay

## 2020-12-27 MED ORDER — MEMANTINE HCL 10 MG PO TABS: 10.0000 mg | ORAL_TABLET | Freq: Two times a day (BID) | ORAL | 1 refills | Status: DC

## 2021-02-19 ENCOUNTER — Other Ambulatory Visit: Payer: Self-pay | Admitting: Family Medicine

## 2021-02-19 DIAGNOSIS — F320 Major depressive disorder, single episode, mild: Secondary | ICD-10-CM

## 2021-03-17 ENCOUNTER — Other Ambulatory Visit: Payer: Self-pay

## 2021-03-17 ENCOUNTER — Ambulatory Visit (INDEPENDENT_AMBULATORY_CARE_PROVIDER_SITE_OTHER): Payer: Medicare HMO | Admitting: Nurse Practitioner

## 2021-03-17 ENCOUNTER — Encounter: Payer: Self-pay | Admitting: Nurse Practitioner

## 2021-03-17 VITALS — BP 142/90 | HR 69 | Temp 97.5°F | Ht 76.0 in | Wt 334.6 lb

## 2021-03-17 DIAGNOSIS — I10 Essential (primary) hypertension: Secondary | ICD-10-CM

## 2021-03-17 MED ORDER — VALSARTAN 160 MG PO TABS: 160.0000 mg | ORAL_TABLET | Freq: Every day | ORAL | 3 refills | Status: DC

## 2021-03-17 NOTE — Progress Notes (Signed)
Acute Office Visit  Subjective:    Patient ID: [name], male    DOB: 1965-10-21, 56 y.o.   MRN: [id number]  CC: Uncontrolled HTN  HPI: [name] is a 56 year old Caucasian male that presents for evaluation of uncontrolled BP. He is accompanied by his father, who is his POA. He tells me he presented to a local area dentist for a procedure today. Procedure was cancelled due to uncontrolled hypertension of 207/108. Repeat BP 188/110. He tells me was diagnosed with hypertension several years ago. Current treatment includes Lisinopril 20 mg daily. Denies cp, dyspnea, or headaches. States he took Lisinopril this am. Denies anxiety, nervousness, or current pain. Reports he does have a dry persistent cough with Lisinopril. Denies eating heart healthy, low sodium diet.   Past Medical History:  Diagnosis Date   Anxiety    Aqueductal stenosis (HCC)    Atrial fibrillation (HCC)    Cardiomyopathy    Carpal tunnel syndrome    Chiari I malformation ([place])    Coronary artery disease    Depression    Headache(784.0)    Hydrocephalus (HCC)    Hypertension    Memory loss    Obstructive sleep apnea    Pulmonary embolism (HCC)    Renal calculi    Restless leg syndrome    RLS (restless legs syndrome) 05/04/2020   Salt-wasting syndrome of infancy    Seizure (HCC)    SIADH (syndrome of inappropriate ADH production) ([place])    Syncope    Recurrent, thought to be neurally mediated    Past Surgical History:  Procedure Laterality Date   CHOLECYSTECTOMY     LOOP RECORDER EXPLANT N/A 10/30/2012   Procedure: LOOP RECORDER EXPLANT;  Surgeon: Deboraha Sprang, MD;  Location: [facility] CATH LAB;  Service: Cardiovascular;  Laterality: N/A;   Nissen Fundoplasty     PROSTATE SURGERY     St. Jude DM [phone] loop     S/p loop recorder   VENTRICULOPERITONEAL SHUNT Bilateral     Family History  Problem Relation Age of Onset   Migraines Mother    CAD Mother    Hypertension Mother    Endometriosis Sister     Seizures Neg Hx     Social History   Socioeconomic History   Marital status: Widowed    Spouse name: [name]   Number of children: 0   Years of education: 12   Highest education level: Not on file  Occupational History   Occupation: CUSTOMER SERVICE    Employer: [id number]    Comment: Not working   Tobacco Use   Smoking status: Never   Smokeless tobacco: Never  Vaping Use   Vaping Use: Never used  Substance and Sexual Activity   Alcohol use: No    Alcohol/week: 0.0 standard drinks   Drug use: No   Sexual activity: Not Currently  Other Topics Concern   Not on file  Social History Narrative   Patient's wife passed away, no children   Patient is right handed   Education level is high school graduate   Drinks caffeine free drinks   Social Determinants of Radio broadcast assistant Strain: Not on file  Food Insecurity: Not on file  Transportation Needs: Not on file  Physical Activity: Not on file  Stress: Not on file  Social Connections: Not on file  Intimate Partner Violence: Not on file    Outpatient Medications Prior to Visit  Medication Sig Dispense Refill   aspirin 81 MG tablet  Take 81 mg by mouth daily.     clonazePAM (KLONOPIN) 0.5 MG tablet Take 1 tablet by mouth once daily 30 tablet 3   fludrocortisone (FLORINEF) 0.1 MG tablet Take 0.2 mg by mouth 2 (two) times daily.     hydrocortisone (CORTEF) 10 MG tablet Take 10 mg by mouth daily.     levETIRAcetam (KEPPRA) 1000 MG tablet Take 1 tablet (1,000 mg total) by mouth 2 (two) times daily. 180 tablet 3   lisinopril (ZESTRIL) 20 MG tablet Take 1 tablet (20 mg total) by mouth daily. 90 tablet 3   Melatonin 5 MG TABS Take by mouth at bedtime.     memantine (NAMENDA) 10 MG tablet Take 1 tablet (10 mg total) by mouth 2 (two) times daily. 180 tablet 1   pramipexole (MIRAPEX) 0.125 MG tablet Take 1 tablet (0.125 mg total) by mouth 2 (two) times daily. 180 tablet 3   Semaglutide (RYBELSUS) 7 MG TABS Take 7 mg by mouth  daily.     SODIUM CHLORIDE PO Take 1,000 mg by mouth in the morning, at noon, and at bedtime.     venlafaxine XR (EFFEXOR-XR) 75 MG 24 hr capsule TAKE 3 CAPSULES BY MOUTH ONCE DAILY 270 capsule 0   No facility-administered medications prior to visit.    Allergies  Allergen Reactions   Omnipaque [Iohexol] Hives   Prednisone Nausea And Vomiting and Other (See Comments)    unknown    Review of Systems  Constitutional:  Negative for chills, fatigue, fever and unexpected weight change.  HENT:  Negative for congestion, rhinorrhea, sinus pressure, sneezing and sore throat.   Eyes:  Negative for discharge and visual disturbance.  Respiratory:  Negative for cough, shortness of breath and wheezing.   Cardiovascular:  Negative for chest pain and palpitations.  Gastrointestinal:  Negative for abdominal pain, diarrhea, nausea and vomiting.  Endocrine: Negative for polydipsia, polyphagia and polyuria.  Genitourinary:  Negative for decreased urine volume, difficulty urinating, dysuria, frequency, penile swelling and urgency.  Musculoskeletal:  Negative for back pain, gait problem, joint swelling, neck pain and neck stiffness.  Neurological:  Negative for dizziness, seizures, weakness, numbness and headaches.  Psychiatric/Behavioral:  Negative for confusion, hallucinations, sleep disturbance and suicidal ideas. The patient is not nervous/anxious and is not hyperactive.       Objective:    Physical Exam Vitals reviewed.  Constitutional:      Appearance: Normal appearance.  HENT:     Head: Normocephalic.     Right Ear: Tympanic membrane normal.     Left Ear: Tympanic membrane normal.     Nose: Nose normal.     Mouth/Throat:     Mouth: Mucous membranes are moist.  Eyes:     Pupils: Pupils are equal, round, and reactive to light.  Cardiovascular:     Rate and Rhythm: Normal rate and regular rhythm.     Pulses: Normal pulses.     Heart sounds: Normal heart sounds.  Pulmonary:     Effort:  Pulmonary effort is normal.     Breath sounds: Normal breath sounds.  Abdominal:     General: Bowel sounds are normal.     Palpations: Abdomen is soft.  Musculoskeletal:        General: Normal range of motion.     Cervical back: Neck supple.  Skin:    General: Skin is warm and dry.     Capillary Refill: Capillary refill takes less than 2 seconds.  Neurological:     General:  No focal deficit present.     Mental Status: He is alert and oriented to person, place, and time.  Psychiatric:        Mood and Affect: Mood normal.        Behavior: Behavior normal.     Wt Readings from Last 3 Encounters:  11/15/20 (!) 333 lb (151 kg)  10/25/20 (!) 337 lb (152.9 kg)  06/23/20 (!) 351 lb (159.2 kg)    Health Maintenance Due  Topic Date Due   Hepatitis C Screening  Never done   TETANUS/TDAP  Never done   Zoster Vaccines- Shingrix (1 of 2) Never done       Lab Results  Component Value Date   TSH 2.440 03/11/2019   Lab Results  Component Value Date   WBC 9.9 10/25/2020   HGB 15.3 10/25/2020   HCT 45.5 10/25/2020   MCV 84 10/25/2020   PLT 347 10/25/2020   Lab Results  Component Value Date   NA 136 10/25/2020   K 4.8 10/25/2020   CO2 24 10/25/2020   GLUCOSE 96 10/25/2020   BUN 12 10/25/2020   CREATININE 0.89 10/25/2020   BILITOT 1.1 10/25/2020   ALKPHOS 84 10/25/2020   AST 26 10/25/2020   ALT 25 10/25/2020   PROT 6.7 10/25/2020   ALBUMIN 4.0 10/25/2020   CALCIUM 9.3 10/25/2020   EGFR 101 10/25/2020   Lab Results  Component Value Date   CHOL 132 06/23/2020   Lab Results  Component Value Date   HDL 35 (L) 06/23/2020   Lab Results  Component Value Date   LDLCALC 76 06/23/2020   Lab Results  Component Value Date   TRIG 115 06/23/2020   Lab Results  Component Value Date   CHOLHDL 3.8 06/23/2020   Lab Results  Component Value Date   HGBA1C 6.1 (H) 10/25/2020       Assessment & Plan:   1. Uncontrolled hypertension - valsartan (DIOVAN) 160 MG tablet;  Take 1 tablet (160 mg total) by mouth daily.  Dispense: 90 tablet; Refill: 3    STOP Lisinopril 20 mg daily Begin Valsartan 160 mg daily Monitor BP at home, keep log Notify office of any side effects Follow-up in 2-weeks, bring BP log  Follow-up: 2-weeks  I, Rip Harbour, NP, have reviewed all documentation for this visit. The documentation on 03/17/21 for the exam, diagnosis, procedures, and orders are all accurate and complete.    Signed, Jerrell Belfast, DNP 03/17/21 at 9:38 PM

## 2021-03-17 NOTE — Patient Instructions (Addendum)
STOP Lisinopril 20 mg daily Begin Valsartan 160 mg daily Monitor BP at home, keep log Notify office of any side effects Follow-up in 2-weeks, bring BP log  Managing Your Hypertension Hypertension, also called high blood pressure, is when the force of the blood pressing against the walls of the arteries is too strong. Arteries are blood vessels that carry blood from your heart throughout your body. Hypertension forces the heart to work harder to pump blood and may cause the arteries to become narrow or stiff. Understanding blood pressure readings Your personal target blood pressure may vary depending on your medical conditions, your age, and other factors. A blood pressure reading includes a higher number over a lower number. Ideally, your blood pressure should be below 120/80. You should know that: The first, or top, number is called the systolic pressure. It is a measure of the pressure in your arteries as your heart beats. The second, or bottom number, is called the diastolic pressure. It is a measure of the pressure in your arteries as the heart relaxes. Blood pressure is classified into four stages. Based on your blood pressure reading, your health care provider may use the following stages to determine what type of treatment you need, if any. Systolic pressure and diastolic pressure are measured in a unit called mmHg. Normal Systolic pressure: below 269. Diastolic pressure: below 80. Elevated Systolic pressure: 485-462. Diastolic pressure: below 80. Hypertension stage 1 Systolic pressure: 703-500. Diastolic pressure: 93-81. Hypertension stage 2 Systolic pressure: 829 or above. Diastolic pressure: 90 or above. How can this condition affect me? Managing your hypertension is an important responsibility. Over time, hypertension can damage the arteries and decrease blood flow to important parts of the body, including the brain, heart, and kidneys. Having untreated or uncontrolled  hypertension can lead to: A heart attack. A stroke. A weakened blood vessel (aneurysm). Heart failure. Kidney damage. Eye damage. Metabolic syndrome. Memory and concentration problems. Vascular dementia. What actions can I take to manage this condition? Hypertension can be managed by making lifestyle changes and possibly by taking medicines. Your health care provider will help you make a plan to bring your blood pressure within a normal range. Nutrition  Eat a diet that is high in fiber and potassium, and low in salt (sodium), added sugar, and fat. An example eating plan is called the Dietary Approaches to Stop Hypertension (DASH) diet. To eat this way: Eat plenty of fresh fruits and vegetables. Try to fill one-half of your plate at each meal with fruits and vegetables. Eat whole grains, such as whole-wheat pasta, brown rice, or whole-grain bread. Fill about one-fourth of your plate with whole grains. Eat low-fat dairy products. Avoid fatty cuts of meat, processed or cured meats, and poultry with skin. Fill about one-fourth of your plate with lean proteins such as fish, chicken without skin, beans, eggs, and tofu. Avoid pre-made and processed foods. These tend to be higher in sodium, added sugar, and fat. Reduce your daily sodium intake. Most people with hypertension should eat less than 1,500 mg of sodium a day. Lifestyle  Work with your health care provider to maintain a healthy body weight or to lose weight. Ask what an ideal weight is for you. Get at least 30 minutes of exercise that causes your heart to beat faster (aerobic exercise) most days of the week. Activities may include walking, swimming, or biking. Include exercise to strengthen your muscles (resistance exercise), such as weight lifting, as part of your weekly exercise routine. Try  to do these types of exercises for 30 minutes at least 3 days a week. Do not use any products that contain nicotine or tobacco, such as  cigarettes, e-cigarettes, and chewing tobacco. If you need help quitting, ask your health care provider. Control any long-term (chronic) conditions you have, such as high cholesterol or diabetes. Identify your sources of stress and find ways to manage stress. This may include meditation, deep breathing, or making time for fun activities. Alcohol use Do not drink alcohol if: Your health care provider tells you not to drink. You are pregnant, may be pregnant, or are planning to become pregnant. If you drink alcohol: Limit how much you use to: 0-1 drink a day for women. 0-2 drinks a day for men. Be aware of how much alcohol is in your drink. In the [place] one drink equals one 12 oz bottle of beer (355 mL), one 5 oz glass of wine (148 mL), or one 1 oz glass of hard liquor (44 mL). Medicines Your health care provider may prescribe medicine if lifestyle changes are not enough to get your blood pressure under control and if: Your systolic blood pressure is 130 or higher. Your diastolic blood pressure is 80 or higher. Take medicines only as told by your health care provider. Follow the directions carefully. Blood pressure medicines must be taken as told by your health care provider. The medicine does not work as well when you skip doses. Skipping doses also puts you at risk for problems. Monitoring Before you monitor your blood pressure: Do not smoke, drink caffeinated beverages, or exercise within 30 minutes before taking a measurement. Use the bathroom and empty your bladder (urinate). Sit quietly for at least 5 minutes before taking measurements. Monitor your blood pressure at home as told by your health care provider. To do this: Sit with your back straight and supported. Place your feet flat on the floor. Do not cross your legs. Support your arm on a flat surface, such as a table. Make sure your upper arm is at heart level. Each time you measure, take two or three readings one minute apart and  record the results. You may also need to have your blood pressure checked regularly by your health care provider. General information Talk with your health care provider about your diet, exercise habits, and other lifestyle factors that may be contributing to hypertension. Review all the medicines you take with your health care provider because there may be side effects or interactions. Keep all visits as told by your health care provider. Your health care provider can help you create and adjust your plan for managing your high blood pressure. Where to find more information National Heart, Lung, and Blood Institute: https://wilson-eaton.com/ American Heart Association: www.heart.org Contact a health care provider if: You think you are having a reaction to medicines you have taken. You have repeated (recurrent) headaches. You feel dizzy. You have swelling in your ankles. You have trouble with your vision. Get help right away if: You develop a severe headache or confusion. You have unusual weakness or numbness, or you feel faint. You have severe pain in your chest or abdomen. You vomit repeatedly. You have trouble breathing. These symptoms may represent a serious problem that is an emergency. Do not wait to see if the symptoms will go away. Get medical help right away. Call your local emergency services (911 in the [place] Do not drive yourself to the hospital. Summary Hypertension is when the force of blood pumping through  your arteries is too strong. If this condition is not controlled, it may put you at risk for serious complications. Your personal target blood pressure may vary depending on your medical conditions, your age, and other factors. For most people, a normal blood pressure is less than 120/80. Hypertension is managed by lifestyle changes, medicines, or both. Lifestyle changes to help manage hypertension include losing weight, eating a healthy, low-sodium diet, exercising more, stopping  smoking, and limiting alcohol. This information is not intended to replace advice given to you by your health care provider. Make sure you discuss any questions you have with your health care provider. Document Revised: 01/27/2019 Document Reviewed: 12/10/2018 Elsevier Patient Education  2022 [place]. How to Take Your Blood Pressure Blood pressure is a measurement of how strongly your blood is pressing against the walls of your arteries. Arteries are blood vessels that carry blood from your heart throughout your body. Your health care provider takes your blood pressure at each office visit. You can also take your own blood pressure at home with a blood pressure monitor. You may need to take your own blood pressure to: Confirm a diagnosis of high blood pressure (hypertension). Monitor your blood pressure over time. Make sure your blood pressure medicine is working. Supplies needed: Blood pressure monitor. Dining room chair to sit in. Table or desk. Small notebook and pencil or pen. How to prepare To get the most accurate reading, avoid the following for 30 minutes before you check your blood pressure: Drinking caffeine. Drinking alcohol. Eating. Smoking. Exercising. Five minutes before you check your blood pressure: Use the bathroom and urinate so that you have an empty bladder. Sit quietly in a dining room chair. Do not sit in a soft couch or an armchair. Do not talk. How to take your blood pressure To check your blood pressure, follow the instructions in the manual that came with your blood pressure monitor. If you have a digital blood pressure monitor, the instructions may be as follows: Sit up straight in a chair. Place your feet on the floor. Do not cross your ankles or legs. Rest your left arm at the level of your heart on a table or desk or on the arm of a chair. Pull up your shirt sleeve. Wrap the blood pressure cuff around the upper part of your left arm, 1 inch (2.5 cm)  above your elbow. It is best to wrap the cuff around bare skin. Fit the cuff snugly around your arm. You should be able to place only one finger between the cuff and your arm. Position the cord so that it rests in the bend of your elbow. Press the power button. Sit quietly while the cuff inflates and deflates. Read the digital reading on the monitor screen and write the numbers down (record them) in a notebook. Wait 2-3 minutes, then repeat the steps, starting at step 1. What does my blood pressure reading mean? A blood pressure reading consists of a higher number over a lower number. Ideally, your blood pressure should be below 120/80. The first ("top") number is called the systolic pressure. It is a measure of the pressure in your arteries as your heart beats. The second ("bottom") number is called the diastolic pressure. It is a measure of the pressure in your arteries as the heart relaxes. Blood pressure is classified into five stages. The following are the stages for adults who do not have a short-term serious illness or a chronic condition. Systolic pressure and diastolic  pressure are measured in a unit called mm Hg (millimeters of mercury).  Normal Systolic pressure: below 102. Diastolic pressure: below 80. Elevated Systolic pressure: 585-277. Diastolic pressure: below 80. Hypertension stage 1 Systolic pressure: 824-235. Diastolic pressure: 36-14. Hypertension stage 2 Systolic pressure: 431 or above. Diastolic pressure: 90 or above. You can have elevated blood pressure or hypertension even if only the systolic or only the diastolic number in your reading is higher than normal. Follow these instructions at home: Medicines Take over-the-counter and prescription medicines only as told by your health care provider. Tell your health care provider if you are having any side effects from blood pressure medicine. General instructions Check your blood pressure as often as recommended by  your health care provider. Check your blood pressure at the same time every day. Take your monitor to the next appointment with your health care provider to make sure that: You are using it correctly. It provides accurate readings. Understand what your goal blood pressure numbers are. Keep all follow-up visits as told by your health care provider. This is important. General tips Your health care provider can suggest a reliable monitor that will meet your needs. There are several types of home blood pressure monitors. Choose a monitor that has an arm cuff. Do not choose a monitor that measures your blood pressure from your wrist or finger. Choose a cuff that wraps snugly around your upper arm. You should be able to fit only one finger between your arm and the cuff. You can buy a blood pressure monitor at most drugstores or online. Where to find more information American Heart Association: www.heart.org Contact a health care provider if: Your blood pressure is consistently high. Your blood pressure is suddenly low. Get help right away if: Your systolic blood pressure is higher than 180. Your diastolic blood pressure is higher than 120. Summary Blood pressure is a measurement of how strongly your blood is pressing against the walls of your arteries. A blood pressure reading consists of a higher number over a lower number. Ideally, your blood pressure should be below 120/80. Check your blood pressure at the same time every day. Avoid caffeine, alcohol, smoking, and exercise for 30 minutes prior to checking your blood pressure. These agents can affect the accuracy of the blood pressure reading. This information is not intended to replace advice given to you by your health care provider. Make sure you discuss any questions you have with your health care provider. Document Revised: 11/19/2019 Document Reviewed: 01/03/2019 Elsevier Patient Education  2022 [place] Eating Plan DASH  stands for Dietary Approaches to Stop Hypertension. The DASH eating plan is a healthy eating plan that has been shown to: Reduce high blood pressure (hypertension). Reduce your risk for type 2 diabetes, heart disease, and stroke. Help with weight loss. What are tips for following this plan? Reading food labels Check food labels for the amount of salt (sodium) per serving. Choose foods with less than 5 percent of the Daily Value of sodium. Generally, foods with less than 300 milligrams (mg) of sodium per serving fit into this eating plan. To find whole grains, look for the word "whole" as the first word in the ingredient list. Shopping Buy products labeled as "low-sodium" or "no salt added." Buy fresh foods. Avoid canned foods and pre-made or frozen meals. Cooking Avoid adding salt when cooking. Use salt-free seasonings or herbs instead of table salt or sea salt. Check with your health care provider or pharmacist before using salt  substitutes. Do not fry foods. Cook foods using healthy methods such as baking, boiling, grilling, roasting, and broiling instead. Cook with heart-healthy oils, such as olive, canola, avocado, soybean, or sunflower oil. Meal planning  Eat a balanced diet that includes: 4 or more servings of fruits and 4 or more servings of vegetables each day. Try to fill one-half of your plate with fruits and vegetables. 6-8 servings of whole grains each day. Less than 6 oz (170 g) of lean meat, poultry, or fish each day. A 3-oz (85-g) serving of meat is about the same size as a deck of cards. One egg equals 1 oz (28 g). 2-3 servings of low-fat dairy each day. One serving is 1 cup (237 mL). 1 serving of nuts, seeds, or beans 5 times each week. 2-3 servings of heart-healthy fats. Healthy fats called omega-3 fatty acids are found in foods such as walnuts, flaxseeds, fortified milks, and eggs. These fats are also found in cold-water fish, such as sardines, salmon, and mackerel. Limit  how much you eat of: Canned or prepackaged foods. Food that is high in trans fat, such as some fried foods. Food that is high in saturated fat, such as fatty meat. Desserts and other sweets, sugary drinks, and other foods with added sugar. Full-fat dairy products. Do not salt foods before eating. Do not eat more than 4 egg yolks a week. Try to eat at least 2 vegetarian meals a week. Eat more home-cooked food and less restaurant, buffet, and fast food. Lifestyle When eating at a restaurant, ask that your food be prepared with less salt or no salt, if possible. If you drink alcohol: Limit how much you use to: 0-1 drink a day for women who are not pregnant. 0-2 drinks a day for men. Be aware of how much alcohol is in your drink. In the [place] one drink equals one 12 oz bottle of beer (355 mL), one 5 oz glass of wine (148 mL), or one 1 oz glass of hard liquor (44 mL). General information Avoid eating more than 2,300 mg of salt a day. If you have hypertension, you may need to reduce your sodium intake to 1,500 mg a day. Work with your health care provider to maintain a healthy body weight or to lose weight. Ask what an ideal weight is for you. Get at least 30 minutes of exercise that causes your heart to beat faster (aerobic exercise) most days of the week. Activities may include walking, swimming, or biking. Work with your health care provider or dietitian to adjust your eating plan to your individual calorie needs. What foods should I eat? Fruits All fresh, dried, or frozen fruit. Canned fruit in natural juice (without added sugar). Vegetables Fresh or frozen vegetables (raw, steamed, roasted, or grilled). Low-sodium or reduced-sodium tomato and vegetable juice. Low-sodium or reduced-sodium tomato sauce and tomato paste. Low-sodium or reduced-sodium canned vegetables. Grains Whole-grain or whole-wheat bread. Whole-grain or whole-wheat pasta. Brown rice. [place]. Bulgur. Whole-grain  and low-sodium cereals. Pita bread. Low-fat, low-sodium crackers. Whole-wheat flour tortillas. Meats and other proteins Skinless chicken or [place]. Ground chicken or [place]. Pork with fat trimmed off. Fish and seafood. Egg whites. Dried beans, peas, or lentils. Unsalted nuts, nut butters, and seeds. Unsalted canned beans. Lean cuts of beef with fat trimmed off. Low-sodium, lean precooked or cured meat, such as sausages or meat loaves. Dairy Low-fat (1%) or fat-free (skim) milk. Reduced-fat, low-fat, or fat-free cheeses. Nonfat, low-sodium ricotta or cottage cheese. Low-fat or nonfat  yogurt. Low-fat, low-sodium cheese. Fats and oils Soft margarine without trans fats. Vegetable oil. Reduced-fat, low-fat, or light mayonnaise and salad dressings (reduced-sodium). Canola, safflower, olive, avocado, soybean, and sunflower oils. Avocado. Seasonings and condiments Herbs. Spices. Seasoning mixes without salt. Other foods Unsalted popcorn and pretzels. Fat-free sweets. The items listed above may not be a complete list of foods and beverages you can eat. Contact a dietitian for more information. What foods should I avoid? Fruits Canned fruit in a light or heavy syrup. Fried fruit. Fruit in cream or butter sauce. Vegetables Creamed or fried vegetables. Vegetables in a cheese sauce. Regular canned vegetables (not low-sodium or reduced-sodium). Regular canned tomato sauce and paste (not low-sodium or reduced-sodium). Regular tomato and vegetable juice (not low-sodium or reduced-sodium). Angie Fava. Olives. Grains Baked goods made with fat, such as croissants, muffins, or some breads. Dry pasta or rice meal packs. Meats and other proteins Fatty cuts of meat. Ribs. Fried meat. Berniece Salines. Bologna, salami, and other precooked or cured meats, such as sausages or meat loaves. Fat from the back of a pig (fatback). Bratwurst. Salted nuts and seeds. Canned beans with added salt. Canned or smoked fish. Whole eggs or egg yolks.  Chicken or [place] with skin. Dairy Whole or 2% milk, cream, and half-and-half. Whole or full-fat cream cheese. Whole-fat or sweetened yogurt. Full-fat cheese. Nondairy creamers. Whipped toppings. Processed cheese and cheese spreads. Fats and oils Butter. Stick margarine. Lard. Shortening. Ghee. Bacon fat. Tropical oils, such as coconut, palm kernel, or palm oil. Seasonings and condiments Onion salt, garlic salt, seasoned salt, table salt, and sea salt. Worcestershire sauce. Tartar sauce. Barbecue sauce. Teriyaki sauce. Soy sauce, including reduced-sodium. Steak sauce. Canned and packaged gravies. Fish sauce. Oyster sauce. Cocktail sauce. Store-bought horseradish. Ketchup. Mustard. Meat flavorings and tenderizers. Bouillon cubes. Hot sauces. Pre-made or packaged marinades. Pre-made or packaged taco seasonings. Relishes. Regular salad dressings. Other foods Salted popcorn and pretzels. The items listed above may not be a complete list of foods and beverages you should avoid. Contact a dietitian for more information. Where to find more information National Heart, Lung, and Blood Institute: https://wilson-eaton.com/ American Heart Association: www.heart.org Academy of Nutrition and Dietetics: www.eatright.[place]: www.kidney.org Summary The DASH eating plan is a healthy eating plan that has been shown to reduce high blood pressure (hypertension). It may also reduce your risk for type 2 diabetes, heart disease, and stroke. When on the DASH eating plan, aim to eat more fresh fruits and vegetables, whole grains, lean proteins, low-fat dairy, and heart-healthy fats. With the DASH eating plan, you should limit salt (sodium) intake to 2,300 mg a day. If you have hypertension, you may need to reduce your sodium intake to 1,500 mg a day. Work with your health care provider or dietitian to adjust your eating plan to your individual calorie needs. This information is not intended to replace  advice given to you by your health care provider. Make sure you discuss any questions you have with your health care provider. Document Revised: 12/13/2018 Document Reviewed: 12/13/2018 Elsevier Patient Education  2022 [employer].

## 2021-03-30 ENCOUNTER — Ambulatory Visit (INDEPENDENT_AMBULATORY_CARE_PROVIDER_SITE_OTHER): Payer: Medicare HMO | Admitting: Nurse Practitioner

## 2021-03-30 ENCOUNTER — Encounter: Payer: Self-pay | Admitting: Nurse Practitioner

## 2021-03-30 ENCOUNTER — Other Ambulatory Visit: Payer: Self-pay

## 2021-03-30 VITALS — BP 152/90 | HR 65 | Temp 97.4°F | Ht 76.0 in | Wt 334.0 lb

## 2021-03-30 DIAGNOSIS — I10 Essential (primary) hypertension: Secondary | ICD-10-CM | POA: Diagnosis not present

## 2021-03-30 MED ORDER — AMLODIPINE BESYLATE 10 MG PO TABS: 10.0000 mg | ORAL_TABLET | Freq: Every day | ORAL | 1 refills | Status: DC

## 2021-03-30 NOTE — Patient Instructions (Addendum)
Begin Amlodipine 10 mg daily for blood pressure Notify office of any adverse side effects of ankles/feet swelling Eat a low salt diet Follow-up April 3rd, 2023 at 10:00 with Dr Tobie Poet    Managing Your Hypertension Hypertension, also called high blood pressure, is when the force of the blood pressing against the walls of the arteries is too strong. Arteries are blood vessels that carry blood from your heart throughout your body. Hypertension forces the heart to work harder to pump blood and may cause the arteries to become narrow or stiff. Understanding blood pressure readings Your personal target blood pressure may vary depending on your medical conditions, your age, and other factors. A blood pressure reading includes a higher number over a lower number. Ideally, your blood pressure should be below 120/80. You should know that: The first, or top, number is called the systolic pressure. It is a measure of the pressure in your arteries as your heart beats. The second, or bottom number, is called the diastolic pressure. It is a measure of the pressure in your arteries as the heart relaxes. Blood pressure is classified into four stages. Based on your blood pressure reading, your health care provider may use the following stages to determine what type of treatment you need, if any. Systolic pressure and diastolic pressure are measured in a unit called mmHg. Normal Systolic pressure: below 169. Diastolic pressure: below 80. Elevated Systolic pressure: 678-938. Diastolic pressure: below 80. Hypertension stage 1 Systolic pressure: 101-751. Diastolic pressure: 02-58. Hypertension stage 2 Systolic pressure: 527 or above. Diastolic pressure: 90 or above. How can this condition affect me? Managing your hypertension is an important responsibility. Over time, hypertension can damage the arteries and decrease blood flow to important parts of the body, including the brain, heart, and kidneys. Having  untreated or uncontrolled hypertension can lead to: A heart attack. A stroke. A weakened blood vessel (aneurysm). Heart failure. Kidney damage. Eye damage. Metabolic syndrome. Memory and concentration problems. Vascular dementia. What actions can I take to manage this condition? Hypertension can be managed by making lifestyle changes and possibly by taking medicines. Your health care provider will help you make a plan to bring your blood pressure within a normal range. Nutrition  Eat a diet that is high in fiber and potassium, and low in salt (sodium), added sugar, and fat. An example eating plan is called the Dietary Approaches to Stop Hypertension (DASH) diet. To eat this way: Eat plenty of fresh fruits and vegetables. Try to fill one-half of your plate at each meal with fruits and vegetables. Eat whole grains, such as whole-wheat pasta, brown rice, or whole-grain bread. Fill about one-fourth of your plate with whole grains. Eat low-fat dairy products. Avoid fatty cuts of meat, processed or cured meats, and poultry with skin. Fill about one-fourth of your plate with lean proteins such as fish, chicken without skin, beans, eggs, and tofu. Avoid pre-made and processed foods. These tend to be higher in sodium, added sugar, and fat. Reduce your daily sodium intake. Most people with hypertension should eat less than 1,500 mg of sodium a day. Lifestyle  Work with your health care provider to maintain a healthy body weight or to lose weight. Ask what an ideal weight is for you. Get at least 30 minutes of exercise that causes your heart to beat faster (aerobic exercise) most days of the week. Activities may include walking, swimming, or biking. Include exercise to strengthen your muscles (resistance exercise), such as weight lifting, as part  of your weekly exercise routine. Try to do these types of exercises for 30 minutes at least 3 days a week. Do not use any products that contain nicotine or  tobacco, such as cigarettes, e-cigarettes, and chewing tobacco. If you need help quitting, ask your health care provider. Control any long-term (chronic) conditions you have, such as high cholesterol or diabetes. Identify your sources of stress and find ways to manage stress. This may include meditation, deep breathing, or making time for fun activities. Alcohol use Do not drink alcohol if: Your health care provider tells you not to drink. You are pregnant, may be pregnant, or are planning to become pregnant. If you drink alcohol: Limit how much you use to: 0-1 drink a day for women. 0-2 drinks a day for men. Be aware of how much alcohol is in your drink. In the [place] one drink equals one 12 oz bottle of beer (355 mL), one 5 oz glass of wine (148 mL), or one 1 oz glass of hard liquor (44 mL). Medicines Your health care provider may prescribe medicine if lifestyle changes are not enough to get your blood pressure under control and if: Your systolic blood pressure is 130 or higher. Your diastolic blood pressure is 80 or higher. Take medicines only as told by your health care provider. Follow the directions carefully. Blood pressure medicines must be taken as told by your health care provider. The medicine does not work as well when you skip doses. Skipping doses also puts you at risk for problems. Monitoring Before you monitor your blood pressure: Do not smoke, drink caffeinated beverages, or exercise within 30 minutes before taking a measurement. Use the bathroom and empty your bladder (urinate). Sit quietly for at least 5 minutes before taking measurements. Monitor your blood pressure at home as told by your health care provider. To do this: Sit with your back straight and supported. Place your feet flat on the floor. Do not cross your legs. Support your arm on a flat surface, such as a table. Make sure your upper arm is at heart level. Each time you measure, take two or three readings one  minute apart and record the results. You may also need to have your blood pressure checked regularly by your health care provider. General information Talk with your health care provider about your diet, exercise habits, and other lifestyle factors that may be contributing to hypertension. Review all the medicines you take with your health care provider because there may be side effects or interactions. Keep all visits as told by your health care provider. Your health care provider can help you create and adjust your plan for managing your high blood pressure. Where to find more information National Heart, Lung, and Blood Institute: https://wilson-eaton.com/ American Heart Association: www.heart.org Contact a health care provider if: You think you are having a reaction to medicines you have taken. You have repeated (recurrent) headaches. You feel dizzy. You have swelling in your ankles. You have trouble with your vision. Get help right away if: You develop a severe headache or confusion. You have unusual weakness or numbness, or you feel faint. You have severe pain in your chest or abdomen. You vomit repeatedly. You have trouble breathing. These symptoms may represent a serious problem that is an emergency. Do not wait to see if the symptoms will go away. Get medical help right away. Call your local emergency services (911 in the [place] Do not drive yourself to the hospital. Summary Hypertension is when  the force of blood pumping through your arteries is too strong. If this condition is not controlled, it may put you at risk for serious complications. Your personal target blood pressure may vary depending on your medical conditions, your age, and other factors. For most people, a normal blood pressure is less than 120/80. Hypertension is managed by lifestyle changes, medicines, or both. Lifestyle changes to help manage hypertension include losing weight, eating a healthy, low-sodium diet,  exercising more, stopping smoking, and limiting alcohol. This information is not intended to replace advice given to you by your health care provider. Make sure you discuss any questions you have with your health care provider. Document Revised: 01/27/2019 Document Reviewed: 12/10/2018 Elsevier Patient Education  2022 [employer].

## 2021-03-30 NOTE — Progress Notes (Signed)
? ?Subjective:  ?Patient ID: [name], male    DOB: [birth year]  Age: 56 y.o. MRN: [id number] ? ?Chief Complaint  ?Patient presents with  ? Hypertension  ? ? ?HPI ? [name] is a 56 year old Caucasian male that presents for follow-up of hypertension. He is accompanied by his father.  ? ?He was last seen for hypertension 2 weeks ago.  ?BP at that visit was 142/90. Management since that visit includes Stop lisinopril 20 mg and start Diovan 160 mg daily. ? ?He reports excellent compliance with treatment. ?He is not having side effects.  ?He is following a Regular diet. ?He is not exercising. ?He does not smoke. ? ?Use of agents associated with hypertension: NSAIDS.  ? ?Outside blood pressures are 170s/90s ?Symptoms: ?No chest pain No chest pressure  ?No palpitations No syncope  ?No dyspnea No orthopnea  ?No paroxysmal nocturnal dyspnea No lower extremity edema  ? ?Pertinent labs: ?Lab Results  ?Component Value Date  ? CHOL 132 06/23/2020  ? HDL 35 (L) 06/23/2020  ? [place] 76 06/23/2020  ? TRIG 115 06/23/2020  ? CHOLHDL 3.8 06/23/2020  ? Lab Results  ?Component Value Date  ? NA 136 10/25/2020  ? K 4.8 10/25/2020  ? CREATININE 0.89 10/25/2020  ? EGFR 101 10/25/2020  ? GFRNONAA 104 03/18/2020  ? GLUCOSE 96 10/25/2020  ?  ? ?The 10-year ASCVD risk score (Arnett DK, et al., 2019) is: 7.3%  ? ?Current Outpatient Medications on File Prior to Visit  ?Medication Sig Dispense Refill  ? aspirin 81 MG tablet Take 81 mg by mouth daily.    ? clonazePAM (KLONOPIN) 0.5 MG tablet Take 1 tablet by mouth once daily 30 tablet 3  ? fludrocortisone (FLORINEF) 0.1 MG tablet Take 0.2 mg by mouth 2 (two) times daily.    ? hydrocortisone (CORTEF) 10 MG tablet Take 10 mg by mouth daily.    ? levETIRAcetam (KEPPRA) 1000 MG tablet Take 1 tablet (1,000 mg total) by mouth 2 (two) times daily. 180 tablet 3  ? Melatonin 5 MG TABS Take by mouth at bedtime.    ? memantine (NAMENDA) 10 MG tablet Take 1 tablet (10 mg total) by mouth 2 (two) times  daily. 180 tablet 1  ? pramipexole (MIRAPEX) 0.125 MG tablet Take 1 tablet (0.125 mg total) by mouth 2 (two) times daily. 180 tablet 3  ? Semaglutide (RYBELSUS) 7 MG TABS Take 7 mg by mouth daily.    ? SODIUM CHLORIDE PO Take 1,000 mg by mouth in the morning, at noon, and at bedtime.    ? valsartan (DIOVAN) 160 MG tablet Take 1 tablet (160 mg total) by mouth daily. 90 tablet 3  ? venlafaxine XR (EFFEXOR-XR) 75 MG 24 hr capsule TAKE 3 CAPSULES BY MOUTH ONCE DAILY 270 capsule 0  ? ?No current facility-administered medications on file prior to visit.  ? ?Past Medical History:  ?Diagnosis Date  ? Anxiety   ? Aqueductal stenosis (HCC)   ? Atrial fibrillation ([place])   ? Cardiomyopathy   ? Carpal tunnel syndrome   ? Chiari I malformation ([place])   ? Coronary artery disease   ? Depression   ? Headache(784.0)   ? Hydrocephalus ([place])   ? Hypertension   ? Memory loss   ? Obstructive sleep apnea   ? Pulmonary embolism ([place])   ? Renal calculi   ? Restless leg syndrome   ? RLS (restless legs syndrome) 05/04/2020  ? Salt-wasting syndrome of infancy   ? Seizure ([place])   ?  SIADH (syndrome of inappropriate ADH production) ([place])   ? Syncope   ? Recurrent, thought to be neurally mediated  ? ?Past Surgical History:  ?Procedure Laterality Date  ? CHOLECYSTECTOMY    ? LOOP RECORDER EXPLANT N/A 10/30/2012  ? Procedure: LOOP RECORDER EXPLANT;  Surgeon: Deboraha Sprang, MD;  Location: [facility] CATH LAB;  Service: Cardiovascular;  Laterality: N/A;  ? Nissen Fundoplasty    ? PROSTATE SURGERY    ? St. Jude DM [id number] loop    ? S/p loop recorder  ? VENTRICULOPERITONEAL SHUNT Bilateral   ?  ?Family History  ?Problem Relation Age of Onset  ? Migraines Mother   ? CAD Mother   ? Hypertension Mother   ? Endometriosis Sister   ? Seizures Neg Hx   ? ?Social History  ? ?Socioeconomic History  ? Marital status: Widowed  ?  Spouse name: [name]  ? Number of children: 0  ? Years of education: 10  ? Highest education level: Not on file  ?Occupational History  ?  Occupation: CUSTOMER SERVICE  ?  Employer: [employer]  ?  Comment: Not working   ?Tobacco Use  ? Smoking status: Never  ? Smokeless tobacco: Never  ?Vaping Use  ? Vaping Use: Never used  ?Substance and Sexual Activity  ? Alcohol use: No  ?  Alcohol/week: 0.0 standard drinks  ? Drug use: No  ? Sexual activity: Not Currently  ?Other Topics Concern  ? Not on file  ?Social History Narrative  ? Patient's wife passed away, no children  ? Patient is right handed  ? Education level is high school graduate  ? Drinks caffeine free drinks  ? ?Social Determinants of Health  ? ?Financial Resource Strain: Not on file  ?Food Insecurity: Not on file  ?Transportation Needs: Not on file  ?Physical Activity: Not on file  ?Stress: Not on file  ?Social Connections: Not on file  ? ? ?Review of Systems  ?Constitutional:  Negative for chills, fatigue and fever.  ?HENT:  Negative for congestion, ear pain, postnasal drip, rhinorrhea, sinus pressure, sinus pain and sore throat.   ?Respiratory:  Negative for cough and shortness of breath.   ?Cardiovascular:  Negative for chest pain.  ?Gastrointestinal:  Negative for diarrhea, nausea and vomiting.  ?Neurological:  Negative for dizziness and headaches.  ? ? ?Objective:  ?BP (!) 152/90   Pulse 65   Temp (!) 97.4 ?F (36.3 ?C)   Ht '6\' 4"'  (1.93 m)   Wt (!) 334 lb (151.5 kg)   SpO2 98%   BMI 40.66 kg/m?   ? ?BP/Weight 03/30/2021 03/17/2021 11/15/2020  ?Systolic BP - 774 128  ?Diastolic BP - 90 82  ?Wt. (Lbs) - 334.6 333  ?BMI 40.73 40.73 40.53  ? ? ?Physical Exam ?Vitals reviewed.  ?Constitutional:   ?   Appearance: He is obese.  ?Cardiovascular:  ?   Rate and Rhythm: Normal rate and regular rhythm.  ?   Pulses: Normal pulses.  ?   Heart sounds: Normal heart sounds.  ?Pulmonary:  ?   Effort: Pulmonary effort is normal.  ?   Breath sounds: Normal breath sounds.  ?Abdominal:  ?   General: Bowel sounds are normal.  ?   Palpations: Abdomen is soft.  ?Musculoskeletal:     ?   General: Normal range of  motion.  ?Skin: ?   General: Skin is warm and dry.  ?   Capillary Refill: Capillary refill takes less than 2 seconds.  ?Neurological:  ?  General: No focal deficit present.  ?   Mental Status: He is alert and oriented to person, place, and time.  ?Psychiatric:     ?   Mood and Affect: Mood normal.     ?   Behavior: Behavior normal.  ? ? ? ?  ? ?Lab Results  ?Component Value Date  ? WBC 9.9 10/25/2020  ? HGB 15.3 10/25/2020  ? HCT 45.5 10/25/2020  ? PLT 347 10/25/2020  ? GLUCOSE 96 10/25/2020  ? CHOL 132 06/23/2020  ? TRIG 115 06/23/2020  ? HDL 35 (L) 06/23/2020  ? [place] 76 06/23/2020  ? ALT 25 10/25/2020  ? AST 26 10/25/2020  ? NA 136 10/25/2020  ? K 4.8 10/25/2020  ? CL 99 10/25/2020  ? CREATININE 0.89 10/25/2020  ? BUN 12 10/25/2020  ? CO2 24 10/25/2020  ? TSH 2.440 03/11/2019  ? INR 1.02 08/07/2011  ? HGBA1C 6.1 (H) 10/25/2020  ? ? ? ? ?Assessment & Plan:  ?1. Uncontrolled hypertension ?- amLODipine (NORVASC) 10 MG tablet; Take 1 tablet (10 mg total) by mouth daily.  Dispense: 90 tablet; Refill: 1 ?  ? ? ?Begin Amlodipine 10 mg daily for blood pressure ?Notify office of any adverse side effects of ankles/feet swelling ?Eat a low salt diet ?Follow-up April 3rd, 2023 at 10:00 with Dr Tobie Poet ? ?  ? ?Follow-up: 04/25/21 with Dr Tobie Poet as scheduled ? ?An After Visit Summary was printed and given to the patient. ? ?I, Rip Harbour, NP, have reviewed all documentation for this visit. The documentation on 03/30/21 for the exam, diagnosis, procedures, and orders are all accurate and complete.  ? ? ?Signed, ?Rip Harbour, NP ?[place] ?[phone] ?

## 2021-04-24 NOTE — Progress Notes (Signed)
? ?Subjective:  ?Patient ID: [name], male    DOB: 1965-03-04  Age: 56 y.o. MRN: [id number] ? ?Chief Complaint  ?Patient presents with  ? Hypertension  ? Prediabetes  ? ?HPI: ?Hypertension: ?Patient is currently taking Valsartan 160 mg take 1 tablet daily, Amlodipine 10 mg take 1 tablet daily. He checks blood pressure at home and systolic range is 242-683 and diastolic 419- 83.  ? ?Prediabetes: Patient is currently taking Rybelsus 7 mg take 1 tablet daily. ? ?Seizures: Patient is currently taking Keppra 1000 mg 1 tablet BID. ? ?Restless Leg Syndrome: Patient is currently taking Mirapex 0.125 mg take 1 tablet twice daily. ? ?Depression: Patient is currently taking Effexor XR take 3 capsules daily.  ? ?Memory Loss: Patient is currently taking Namenda 10 mg 1 tablet twice daily. Seems to worsen.  ? ?Diet & Exercise: Patient goes to the Gym 3-5 days out of the week and his father looks after him and comes by to check on him and how he's eating and states he is trying to keep him from eating foods that are not good for him. ? ?[place] Office Visit from 04/25/2021 in [place]  ?PHQ-9 Total Score 3  ? ?  ?   ?Current Outpatient Medications on File Prior to Visit  ?Medication Sig Dispense Refill  ? amLODipine (NORVASC) 10 MG tablet Take 1 tablet (10 mg total) by mouth daily. 90 tablet 1  ? aspirin 81 MG tablet Take 81 mg by mouth daily.    ? clonazePAM (KLONOPIN) 0.5 MG tablet Take 1 tablet by mouth once daily 30 tablet 3  ? fludrocortisone (FLORINEF) 0.1 MG tablet Take 0.2 mg by mouth 2 (two) times daily.    ? hydrocortisone (CORTEF) 10 MG tablet Take 10 mg by mouth daily.    ? Melatonin 5 MG TABS Take by mouth at bedtime.    ? Semaglutide (RYBELSUS) 7 MG TABS Take 7 mg by mouth daily.    ? SODIUM CHLORIDE PO Take 1,000 mg by mouth in the morning, at noon, and at bedtime.    ? venlafaxine XR (EFFEXOR-XR) 75 MG 24 hr capsule TAKE 3 CAPSULES BY MOUTH ONCE DAILY 270 capsule 0  ? ?No current  facility-administered medications on file prior to visit.  ? ?Past Medical History:  ?Diagnosis Date  ? Anxiety   ? Aqueductal stenosis (HCC)   ? Atrial fibrillation ([place])   ? Cardiomyopathy   ? Carpal tunnel syndrome   ? Chiari I malformation ([place])   ? Coronary artery disease   ? Depression   ? Headache(784.0)   ? Hydrocephalus ([place])   ? Hypertension   ? Memory loss   ? Obstructive sleep apnea   ? Pulmonary embolism ([place])   ? Renal calculi   ? Restless leg syndrome   ? RLS (restless legs syndrome) 05/04/2020  ? Salt-wasting syndrome of infancy   ? Seizure ([place])   ? SIADH (syndrome of inappropriate ADH production) ([place])   ? Syncope   ? Recurrent, thought to be neurally mediated  ? ?Past Surgical History:  ?Procedure Laterality Date  ? CHOLECYSTECTOMY    ? LOOP RECORDER EXPLANT N/A 10/30/2012  ? Procedure: LOOP RECORDER EXPLANT;  Surgeon: Deboraha Sprang, MD;  Location: [facility] CATH LAB;  Service: Cardiovascular;  Laterality: N/A;  ? Nissen Fundoplasty    ? PROSTATE SURGERY    ? St. Jude DM [phone] loop    ? S/p loop recorder  ? VENTRICULOPERITONEAL SHUNT Bilateral   ?  ?  Family History  ?Problem Relation Age of Onset  ? Migraines Mother   ? CAD Mother   ? Hypertension Mother   ? Endometriosis Sister   ? Seizures Neg Hx   ? ?Social History  ? ?Socioeconomic History  ? Marital status: Widowed  ?  Spouse name: [name]  ? Number of children: 0  ? Years of education: 82  ? Highest education level: Not on file  ?Occupational History  ? Occupation: CUSTOMER SERVICE  ?  Employer: [id number]  ?  Comment: Not working   ?Tobacco Use  ? Smoking status: Never  ? Smokeless tobacco: Never  ?Vaping Use  ? Vaping Use: Never used  ?Substance and Sexual Activity  ? Alcohol use: No  ?  Alcohol/week: 0.0 standard drinks  ? Drug use: No  ? Sexual activity: Not Currently  ?Other Topics Concern  ? Not on file  ?Social History Narrative  ? Patient's wife passed away, no children  ? Patient is right handed  ? Education level is high school graduate  ?  Drinks caffeine free drinks  ? ?Social Determinants of Health  ? ?Financial Resource Strain: Not on file  ?Food Insecurity: Not on file  ?Transportation Needs: Not on file  ?Physical Activity: Not on file  ?Stress: Not on file  ?Social Connections: Not on file  ? ? ?Review of Systems  ?Constitutional:  Negative for appetite change, fatigue and fever.  ?HENT:  Negative for congestion, ear pain, sinus pressure and sore throat.   ?Respiratory:  Negative for cough, chest tightness, shortness of breath and wheezing.   ?Cardiovascular:  Negative for chest pain and palpitations.  ?Gastrointestinal:  Negative for abdominal pain, constipation, diarrhea, nausea and vomiting.  ?Genitourinary:  Negative for dysuria and hematuria.  ?Musculoskeletal:  Negative for arthralgias, back pain, joint swelling and myalgias.  ?Skin:  Negative for rash.  ?     Large Skin tag under right arm pit.  ?Neurological:  Negative for dizziness, weakness and headaches.  ?Psychiatric/Behavioral:  Negative for dysphoric mood. The patient is not nervous/anxious.   ? ? ?Objective:  ?BP (!) 144/65   Pulse 66   Temp 98.2 ?F (36.8 ?C) (Oral)   Ht '6\' 4"'$  (1.93 m)   Wt (!) 334 lb (151.5 kg)   SpO2 96%   BMI 40.66 kg/m?  ? ? ?  05/05/2021  ?  9:50 AM 04/25/2021  ? 10:55 AM 04/25/2021  ? 10:25 AM  ?BP/Weight  ?Systolic BP 749 449 675  ?Diastolic BP 95 65 72  ?Wt. (Lbs) 335.5  334  ?BMI 40.84 kg/m2  40.66 kg/m2  ? ? ?Physical Exam ?Vitals reviewed.  ?Constitutional:   ?   Appearance: Normal appearance. He is normal weight.  ?Neck:  ?   Vascular: No carotid bruit.  ?Cardiovascular:  ?   Rate and Rhythm: Normal rate and regular rhythm.  ?   Heart sounds: Normal heart sounds.  ?Pulmonary:  ?   Effort: Pulmonary effort is normal.  ?   Breath sounds: Normal breath sounds. No wheezing, rhonchi or rales.  ?Abdominal:  ?   General: Abdomen is flat. Bowel sounds are normal.  ?   Palpations: Abdomen is soft.  ?   Tenderness: There is no abdominal tenderness.  ?Skin: ?    Findings: Lesion (numerous very large skin tags under rt axilla and on the rt side of his torso.) present.  ?Neurological:  ?   Mental Status: He is alert and oriented to person, place, and time.  ?  Psychiatric:     ?   Mood and Affect: Mood normal.     ?   Behavior: Behavior normal.  ? ? ?Diabetic Foot Exam - Simple   ?No data filed ?  ?  ? ?Lab Results  ?Component Value Date  ? WBC 10.1 04/25/2021  ? HGB 15.7 04/25/2021  ? HCT 46.7 04/25/2021  ? PLT 378 04/25/2021  ? GLUCOSE 95 04/25/2021  ? CHOL 144 04/25/2021  ? TRIG 110 04/25/2021  ? HDL 38 (L) 04/25/2021  ? [place] 86 04/25/2021  ? ALT 22 04/25/2021  ? AST 27 04/25/2021  ? NA 132 (L) 04/25/2021  ? K 4.5 04/25/2021  ? CL 97 04/25/2021  ? CREATININE 0.79 04/25/2021  ? BUN 14 04/25/2021  ? CO2 22 04/25/2021  ? TSH 2.440 03/11/2019  ? INR 1.02 08/07/2011  ? HGBA1C 6.1 (H) 04/25/2021  ? ? ? ? ?Assessment & Plan:  ? ?Problem List Items Addressed This Visit   ? ?  ? Cardiovascular and Mediastinum  ? Essential hypertension - Primary  ?  Fair controlled.  ?Increase Valsartan to 320 mg once daily, continue to check BP daily and keep a log. ?Bring your BP cuff in at your next visit. ?Continue to work on eating a healthy diet and exercise.  ?Labs drawn today.  ?  ?  ? Relevant Medications  ? valsartan (DIOVAN) 320 MG tablet  ? Other Relevant Orders  ? CBC With Diff/Platelet (Completed)  ? Comprehensive metabolic panel (Completed)  ? Lipid panel (Completed)  ?  ? Endocrine  ? Adrenal insufficiency ([place])  ?  Management per specialist. ?  ?  ? Hypogonadism, male  ?  The current medical regimen is effective;  continue present plan and medications. ?  ?  ?  ? Nervous and Auditory  ? 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  Management per specialist. ?  ?  ?  ? Other  ? Prediabetes  ?  Recommend continue to work on eating healthy diet and exercise. ? ?  ?  ? Relevant Orders  ? Hemoglobin A1c (Completed)  ? Memory loss  ?  The current medical regimen is effective;  continue present  plan and medications. Memantine 10 mg daily. ?  ?  ? Seizures ([place])  ?  Management per Specialist. Patient is taking Keppra 1000 mg twice a day. ?  ?  ? Morbid obesity ([place])  ?  Recommend continue to work on eating heal

## 2021-04-25 ENCOUNTER — Encounter: Payer: Self-pay | Admitting: Family Medicine

## 2021-04-25 ENCOUNTER — Ambulatory Visit (INDEPENDENT_AMBULATORY_CARE_PROVIDER_SITE_OTHER): Payer: Medicare HMO | Admitting: Family Medicine

## 2021-04-25 VITALS — BP 144/65 | HR 66 | Temp 98.2°F | Ht 76.0 in | Wt 334.0 lb

## 2021-04-25 DIAGNOSIS — E2740 Unspecified adrenocortical insufficiency: Secondary | ICD-10-CM | POA: Diagnosis not present

## 2021-04-25 DIAGNOSIS — E291 Testicular hypofunction: Secondary | ICD-10-CM | POA: Diagnosis not present

## 2021-04-25 DIAGNOSIS — I10 Essential (primary) hypertension: Secondary | ICD-10-CM | POA: Diagnosis not present

## 2021-04-25 DIAGNOSIS — Q039 Congenital hydrocephalus, unspecified: Secondary | ICD-10-CM

## 2021-04-25 DIAGNOSIS — R569 Unspecified convulsions: Secondary | ICD-10-CM | POA: Diagnosis not present

## 2021-04-25 DIAGNOSIS — F330 Major depressive disorder, recurrent, mild: Secondary | ICD-10-CM

## 2021-04-25 DIAGNOSIS — G2581 Restless legs syndrome: Secondary | ICD-10-CM | POA: Diagnosis not present

## 2021-04-25 DIAGNOSIS — R7303 Prediabetes: Secondary | ICD-10-CM

## 2021-04-25 DIAGNOSIS — R413 Other amnesia: Secondary | ICD-10-CM

## 2021-04-25 DIAGNOSIS — R69 Illness, unspecified: Secondary | ICD-10-CM | POA: Diagnosis not present

## 2021-04-25 MED ORDER — VALSARTAN 320 MG PO TABS: 320.0000 mg | ORAL_TABLET | Freq: Every day | ORAL | 0 refills | Status: DC

## 2021-04-25 NOTE — Patient Instructions (Addendum)
INCREASE VALSARTAN TO 320 MG ONCE DAILY. ?CONTINUE TO CHECK BP DAILY AND KEEP A LOG.  ?BRING YOUR BP CUFF IN AT YOUR NEXT VISIT.  ?

## 2021-04-26 LAB — CBC WITH DIFF/PLATELET
Basophils Absolute: 0.1 10*3/uL (ref 0.0–0.2)
Basos: 1 %
EOS (ABSOLUTE): 0.1 10*3/uL (ref 0.0–0.4)
Eos: 1 %
Hematocrit: 46.7 % (ref 37.5–51.0)
Hemoglobin: 15.7 g/dL (ref 13.0–17.7)
Immature Grans (Abs): 0.1 10*3/uL (ref 0.0–0.1)
Immature Granulocytes: 1 %
Lymphocytes Absolute: 1.6 10*3/uL (ref 0.7–3.1)
Lymphs: 16 %
MCH: 28.1 pg (ref 26.6–33.0)
MCHC: 33.6 g/dL (ref 31.5–35.7)
MCV: 84 fL (ref 79–97)
Monocytes Absolute: 0.8 10*3/uL (ref 0.1–0.9)
Monocytes: 8 %
Neutrophils Absolute: 7.5 10*3/uL — ABNORMAL HIGH (ref 1.4–7.0)
Neutrophils: 73 %
Platelets: 378 10*3/uL (ref 150–450)
RBC: 5.59 x10E6/uL (ref 4.14–5.80)
RDW: 14.2 % (ref 11.6–15.4)
WBC: 10.1 10*3/uL (ref 3.4–10.8)

## 2021-04-26 LAB — COMPREHENSIVE METABOLIC PANEL
ALT: 22 IU/L (ref 0–44)
AST: 27 IU/L (ref 0–40)
Albumin/Globulin Ratio: 1.5 (ref 1.2–2.2)
Albumin: 4.3 g/dL (ref 3.8–4.9)
Alkaline Phosphatase: 84 IU/L (ref 44–121)
BUN/Creatinine Ratio: 18 (ref 9–20)
BUN: 14 mg/dL (ref 6–24)
Bilirubin Total: 1 mg/dL (ref 0.0–1.2)
CO2: 22 mmol/L (ref 20–29)
Calcium: 9.4 mg/dL (ref 8.7–10.2)
Chloride: 97 mmol/L (ref 96–106)
Creatinine, Ser: 0.79 mg/dL (ref 0.76–1.27)
Globulin, Total: 2.9 g/dL (ref 1.5–4.5)
Glucose: 95 mg/dL (ref 70–99)
Potassium: 4.5 mmol/L (ref 3.5–5.2)
Sodium: 132 mmol/L — ABNORMAL LOW (ref 134–144)
Total Protein: 7.2 g/dL (ref 6.0–8.5)
eGFR: 104 mL/min/{1.73_m2} (ref >59)

## 2021-04-26 LAB — LIPID PANEL
Chol/HDL Ratio: 3.8 ratio (ref 0.0–5.0)
Cholesterol, Total: 144 mg/dL (ref 100–199)
HDL: 38 mg/dL — ABNORMAL LOW (ref >39)
LDL Chol Calc (NIH): 86 mg/dL (ref 0–99)
Triglycerides: 110 mg/dL (ref 0–149)
VLDL Cholesterol Cal: 20 mg/dL (ref 5–40)

## 2021-04-26 LAB — HEMOGLOBIN A1C
Est. average glucose Bld gHb Est-mCnc: 128 mg/dL
Hgb A1c MFr Bld: 6.1 % — ABNORMAL HIGH (ref 4.8–5.6)

## 2021-04-27 NOTE — Assessment & Plan Note (Addendum)
Fair controlled.  ?Increase Valsartan to 320 mg once daily, continue to check BP daily and keep a log. ?Bring your BP cuff in at your next visit. ?Continue to work on eating a healthy diet and exercise.  ?Labs drawn today.  ?

## 2021-04-27 NOTE — Assessment & Plan Note (Addendum)
Recommend continue to work on eating healthy diet and exercise.  

## 2021-04-27 NOTE — Assessment & Plan Note (Signed)
Management per specialist. 

## 2021-04-27 NOTE — Assessment & Plan Note (Addendum)
Management per specialist. 

## 2021-04-27 NOTE — Assessment & Plan Note (Addendum)
The current medical regimen is effective;  continue present plan and medications, Mirapex 0.125 mg daily. ?

## 2021-04-27 NOTE — Assessment & Plan Note (Signed)
Management per Specialist. Patient is taking Keppra 1000 mg twice a day. ?

## 2021-04-27 NOTE — Assessment & Plan Note (Signed)
The current medical regimen is effective;  continue present plan and medications. Memantine 10 mg daily. ?

## 2021-04-27 NOTE — Assessment & Plan Note (Signed)
Well controlled on Effexor XR '75mg'$  3 daily in AM. ?

## 2021-04-27 NOTE — Assessment & Plan Note (Signed)
The current medical regimen is effective;  continue present plan and medications.  

## 2021-04-27 NOTE — Assessment & Plan Note (Signed)
Recommend continue to work on eating healthy diet and exercise.  

## 2021-05-02 ENCOUNTER — Other Ambulatory Visit: Payer: Self-pay

## 2021-05-05 ENCOUNTER — Encounter: Payer: Self-pay | Admitting: Neurology

## 2021-05-05 ENCOUNTER — Ambulatory Visit: Payer: Medicare HMO | Admitting: Neurology

## 2021-05-05 VITALS — BP 153/95 | HR 69 | Ht 76.0 in | Wt 335.5 lb

## 2021-05-05 DIAGNOSIS — R413 Other amnesia: Secondary | ICD-10-CM

## 2021-05-05 DIAGNOSIS — Q039 Congenital hydrocephalus, unspecified: Secondary | ICD-10-CM

## 2021-05-05 DIAGNOSIS — G2581 Restless legs syndrome: Secondary | ICD-10-CM | POA: Diagnosis not present

## 2021-05-05 DIAGNOSIS — R569 Unspecified convulsions: Secondary | ICD-10-CM | POA: Diagnosis not present

## 2021-05-05 MED ORDER — LEVETIRACETAM 1000 MG PO TABS: 1000.0000 mg | ORAL_TABLET | Freq: Two times a day (BID) | ORAL | 3 refills | Status: DC

## 2021-05-05 MED ORDER — PRAMIPEXOLE DIHYDROCHLORIDE 0.125 MG PO TABS: 0.1250 mg | ORAL_TABLET | Freq: Two times a day (BID) | ORAL | 3 refills | Status: DC

## 2021-05-05 MED ORDER — MIRABEGRON ER 25 MG PO TB24: 25.0000 mg | ORAL_TABLET | Freq: Every day | ORAL | 5 refills | Status: DC

## 2021-05-05 MED ORDER — MEMANTINE HCL 10 MG PO TABS: 10.0000 mg | ORAL_TABLET | Freq: Two times a day (BID) | ORAL | 3 refills | Status: DC

## 2021-05-05 NOTE — Patient Instructions (Addendum)
Add back in [place] for bladder ?Cut back the Mirapex to 1 tablet at bedtime  ?Keep other medications ?See you back in 1 year  ? ?Meds ordered this encounter  ?Medications  ? mirabegron ER (MYRBETRIQ) 25 MG TB24 tablet  ?  Sig: Take 1 tablet (25 mg total) by mouth daily.  ?  Dispense:  30 tablet  ?  Refill:  5  ? pramipexole (MIRAPEX) 0.125 MG tablet  ?  Sig: Take 1 tablet (0.125 mg total) by mouth 2 (two) times daily.  ?  Dispense:  180 tablet  ?  Refill:  3  ? memantine (NAMENDA) 10 MG tablet  ?  Sig: Take 1 tablet (10 mg total) by mouth 2 (two) times daily.  ?  Dispense:  180 tablet  ?  Refill:  3  ? levETIRAcetam (KEPPRA) 1000 MG tablet  ?  Sig: Take 1 tablet (1,000 mg total) by mouth 2 (two) times daily.  ?  Dispense:  180 tablet  ?  Refill:  3  ? ? ? ? ?

## 2021-05-05 NOTE — Progress Notes (Signed)
? ? ?Patient: [name] ?Date of Birth: [birth year] ? ?Reason for Visit: Follow up for seizures, memory ?History from: Patient, mother, father ?Primary Neurologist: Dr. April Manson ? ?ASSESSMENT AND PLAN ?56 y.o. year old male  ? ?1.  History of seizures ?-Continue Keppra 1000 mg twice daily ?-Reportedly his seizures were related to his past hydrocephalus, none since VP shunt ? ?2.  Mild memory disturbance ?-MMSE 25/30, trouble with short-term memory, his 71 year old parents help him with day to day activities  ?-Continue Namenda 10 mg twice daily ?-We talked about Aricept, some potential to lower seizure threshold, but other medications have this also, not sure we'd see much difference with Aricept, will revisit at next appointment ? ?3.  Aqueductal stenosis, status post VP shunt ?-Stable, no issues ? ?4.  Restless leg syndrome ?-To simplify medication regimen, try to reduce Mirapex 0.125 mg to once daily at bedtime  ? ?5.  Urinary frequency/urgency ?-I refilled Myrbetriq 25 mg 24 hour tablet that Dr. [name] has giving in past, if still issues, need to see urology ? ?They wish to see Dr. April Manson in 1 year, then are okay to rotate with me for office visits.  ? ?HISTORY OF PRESENT ILLNESS: ?Today 05/05/21 ?[name] here today for follow-up.  He is on Keppra, Mirapex, Namenda from this office. He lives alone with his dog. Doesn't drive, his parents drive him, manage his medications with pill box. He walks to the store. No major health issues of recent. No falls. Has bilateral VP shunts, was last about 10 years ago, Dr. Joya Salm placed. Memory has declined, MMSE 25/30. Short term memory is poor. His parents have to call multiple times to remind him of appointments. Trying to walk for exercise, but has urinate frequently. Has lost 30 lbs since last year.  ? ?HISTORY  ?05/04/2020 Dr. [name]: [name] is a 56 year old right-handed white male with a history of aqueductal stenosis with hydrocephalus, status post VP shunt.   He has some baseline memory problems, he has adrenal insufficiency and is being treated with Florinef and hydrocortisone..  The patient does have a history of seizures that have been well controlled on Keppra, he tolerates the drug fairly well.  The patient lost his wife within the last year, he now lives alone and is planning on moving into [place].  The patient comes into the office today with his father.  The father manages the medications.  The patient has prediabetes with a hemoglobin A1c of 6.2, he does have some problems with high blood pressure and restless leg syndrome.  He takes low-dose Mirapex for this.  He is on Namenda for his memory issues.  The patient does not operate a motor vehicle.  He returns for further evaluation. ? ?REVIEW OF SYSTEMS: Out of a complete 14 system review of symptoms, the patient complains only of the following symptoms, and all other reviewed systems are negative. ? ?See HPI ? ?ALLERGIES: ?Allergies  ?Allergen Reactions  ? Omnipaque [Iohexol] Hives  ? Prednisone Nausea And Vomiting and Other (See Comments)  ?  unknown  ? ? ?HOME MEDICATIONS: ?Outpatient Medications Prior to Visit  ?Medication Sig Dispense Refill  ? amLODipine (NORVASC) 10 MG tablet Take 1 tablet (10 mg total) by mouth daily. 90 tablet 1  ? aspirin 81 MG tablet Take 81 mg by mouth daily.    ? clonazePAM (KLONOPIN) 0.5 MG tablet Take 1 tablet by mouth once daily 30 tablet 3  ? fludrocortisone (FLORINEF) 0.1 MG tablet Take 0.2 mg  by mouth 2 (two) times daily.    ? hydrocortisone (CORTEF) 10 MG tablet Take 10 mg by mouth daily.    ? levETIRAcetam (KEPPRA) 1000 MG tablet Take 1 tablet (1,000 mg total) by mouth 2 (two) times daily. 180 tablet 3  ? Melatonin 5 MG TABS Take by mouth at bedtime.    ? memantine (NAMENDA) 10 MG tablet Take 1 tablet (10 mg total) by mouth 2 (two) times daily. 180 tablet 1  ? pramipexole (MIRAPEX) 0.125 MG tablet Take 1 tablet (0.125 mg total) by mouth 2 (two) times daily. 180 tablet 3  ?  Semaglutide (RYBELSUS) 7 MG TABS Take 7 mg by mouth daily.    ? SODIUM CHLORIDE PO Take 1,000 mg by mouth in the morning, at noon, and at bedtime.    ? valsartan (DIOVAN) 320 MG tablet Take 1 tablet (320 mg total) by mouth daily. 90 tablet 0  ? venlafaxine XR (EFFEXOR-XR) 75 MG 24 hr capsule TAKE 3 CAPSULES BY MOUTH ONCE DAILY 270 capsule 0  ? ?No facility-administered medications prior to visit.  ? ? ?PAST MEDICAL HISTORY: ?Past Medical History:  ?Diagnosis Date  ? Anxiety   ? Aqueductal stenosis (HCC)   ? Atrial fibrillation ([place])   ? Cardiomyopathy   ? Carpal tunnel syndrome   ? Chiari I malformation ([place])   ? Coronary artery disease   ? Depression   ? Headache(784.0)   ? Hydrocephalus ([place])   ? Hypertension   ? Memory loss   ? Obstructive sleep apnea   ? Pulmonary embolism ([place])   ? Renal calculi   ? Restless leg syndrome   ? RLS (restless legs syndrome) 05/04/2020  ? Salt-wasting syndrome of infancy   ? Seizure ([place])   ? SIADH (syndrome of inappropriate ADH production) ([place])   ? Syncope   ? Recurrent, thought to be neurally mediated  ? ? ?PAST SURGICAL HISTORY: ?Past Surgical History:  ?Procedure Laterality Date  ? CHOLECYSTECTOMY    ? LOOP RECORDER EXPLANT N/A 10/30/2012  ? Procedure: LOOP RECORDER EXPLANT;  Surgeon: Deboraha Sprang, MD;  Location: [facility] CATH LAB;  Service: Cardiovascular;  Laterality: N/A;  ? Nissen Fundoplasty    ? PROSTATE SURGERY    ? [employer] DM [phone] loop    ? S/p loop recorder  ? VENTRICULOPERITONEAL SHUNT Bilateral   ? ? ?FAMILY HISTORY: ?Family History  ?Problem Relation Age of Onset  ? Migraines Mother   ? CAD Mother   ? Hypertension Mother   ? Endometriosis Sister   ? Seizures Neg Hx   ? ? ?SOCIAL HISTORY: ?Social History  ? ?Socioeconomic History  ? Marital status: Widowed  ?  Spouse name: [name]  ? Number of children: 0  ? Years of education: 31  ? Highest education level: Not on file  ?Occupational History  ? Occupation: CUSTOMER SERVICE  ?  Employer: [employer]  ?  Comment: Not  working   ?Tobacco Use  ? Smoking status: Never  ? Smokeless tobacco: Never  ?Vaping Use  ? Vaping Use: Never used  ?Substance and Sexual Activity  ? Alcohol use: No  ?  Alcohol/week: 0.0 standard drinks  ? Drug use: No  ? Sexual activity: Not Currently  ?Other Topics Concern  ? Not on file  ?Social History Narrative  ? Patient's wife passed away, no children  ? Patient is right handed  ? Education level is high school graduate  ? Drinks caffeine free drinks  ? ?Social Determinants of  Health  ? ?Financial Resource Strain: Not on file  ?Food Insecurity: Not on file  ?Transportation Needs: Not on file  ?Physical Activity: Not on file  ?Stress: Not on file  ?Social Connections: Not on file  ?Intimate Partner Violence: Not on file  ? ? ?PHYSICAL EXAM ? ?Vitals:  ? 05/05/21 0950  ?BP: (!) 153/95  ?Pulse: 69  ?Weight: (!) 335 lb 8 oz (152.2 kg)  ?Height: '6\' 4"'$  (1.93 m)  ? ?Body mass index is 40.84 kg/m?. ? ?  05/05/2021  ?  9:59 AM 08/13/2019  ? 10:22 AM 05/05/2019  ?  2:10 PM  ?MMSE - Mini Mental State Exam  ?Orientation to time '3 5 5  '$ ?Orientation to Place '5 5 5  '$ ?Registration '3 3 3  '$ ?Attention/ Calculation '4 5 5  '$ ?Recall '1 1 2  '$ ?Language- name 2 objects '2 2 2  '$ ?Language- repeat '1 1 1  '$ ?Language- follow 3 step command '3 1 3  '$ ?Language- read & follow direction '1 1 1  '$ ?Write a sentence '1 1 1  '$ ?Copy design 1 1 0  ?Copy design-comments   25 animals  ?Total score '25 26 28  '$ ? ?Generalized: Well developed, in no acute distress  ?Neurological examination  ?Mentation: Alert oriented to time, place, history taking. Follows all commands speech and language fluent ?Cranial nerve II-XII: Pupils were equal round reactive to light. Extraocular movements were full, visual field were full on confrontational test. Facial sensation and strength were normal.  Head turning and shoulder shrug  were normal and symmetric. ?Motor: The motor testing reveals 5 over 5 strength of all 4 extremities. Good symmetric motor tone is noted throughout.   ?Sensory: Sensory testing is intact to soft touch on all 4 extremities. No evidence of extinction is noted.  ?Coordination: Cerebellar testing reveals good finger-nose-finger and heel-to-shin bilaterally.  Fi

## 2021-05-18 DIAGNOSIS — E2740 Unspecified adrenocortical insufficiency: Secondary | ICD-10-CM | POA: Diagnosis not present

## 2021-05-18 DIAGNOSIS — E871 Hypo-osmolality and hyponatremia: Secondary | ICD-10-CM | POA: Diagnosis not present

## 2021-05-18 DIAGNOSIS — R7303 Prediabetes: Secondary | ICD-10-CM | POA: Diagnosis not present

## 2021-05-26 ENCOUNTER — Telehealth: Payer: Self-pay

## 2021-05-26 NOTE — Chronic Care Management (AMB) (Addendum)
? ? ?  Chronic Care Management ?Pharmacy Assistant  ? ?Name: [name]  MRN: [id number] DOB: [birth year] ? ?Reason for Encounter: Patient Assistance Coordination ?  ?05/26/2021- Patient's father came into the office today to pick up patient's medication from [employer] patient assistance program Rybelsus 7 mg. Father stated patient just had an Endocrinology appointment and his Rybelsus was increased to 14 mg. Father aware that I can send a reorder/change request form to Dr Tobie Poet for a signature to be faxed to [employer]. Patient is now taking 2- 7 mg tablets, he will have about 2 months of medication on hand. Form filled out and sent to PCP office. Reorder form faxed in office.  ? ?Medications: ?Outpatient Encounter Medications as of 05/26/2021  ?Medication Sig  ? amLODipine (NORVASC) 10 MG tablet Take 1 tablet (10 mg total) by mouth daily.  ? aspirin 81 MG tablet Take 81 mg by mouth daily.  ? clonazePAM (KLONOPIN) 0.5 MG tablet Take 1 tablet by mouth once daily  ? fludrocortisone (FLORINEF) 0.1 MG tablet Take 0.2 mg by mouth 2 (two) times daily.  ? hydrocortisone (CORTEF) 10 MG tablet Take 10 mg by mouth daily.  ? levETIRAcetam (KEPPRA) 1000 MG tablet Take 1 tablet (1,000 mg total) by mouth 2 (two) times daily.  ? Melatonin 5 MG TABS Take by mouth at bedtime.  ? memantine (NAMENDA) 10 MG tablet Take 1 tablet (10 mg total) by mouth 2 (two) times daily.  ? mirabegron ER (MYRBETRIQ) 25 MG TB24 tablet Take 1 tablet (25 mg total) by mouth daily.  ? pramipexole (MIRAPEX) 0.125 MG tablet Take 1 tablet (0.125 mg total) by mouth 2 (two) times daily.  ? Semaglutide (RYBELSUS) 7 MG TABS Take 7 mg by mouth daily.  ? SODIUM CHLORIDE PO Take 1,000 mg by mouth in the morning, at noon, and at bedtime.  ? valsartan (DIOVAN) 320 MG tablet Take 1 tablet (320 mg total) by mouth daily.  ? venlafaxine XR (EFFEXOR-XR) 75 MG 24 hr capsule TAKE 3 CAPSULES BY MOUTH ONCE DAILY  ? ?No facility-administered encounter medications on file as  of 05/26/2021.  ? ? ?[name], CMA ?Clinical Pharmacist Assistant ?[phone] ? ?

## 2021-05-26 NOTE — Chronic Care Management (AMB) (Signed)
Fax received from [employer] patient assistance program, patient's Rybelsus 7 mg will be filled 05/21/2021 and arrive to the office in 10-14 business days.  ? ?[name], CMA ?Clinical Pharmacist Assistant ?[phone] ? ?

## 2021-06-01 ENCOUNTER — Encounter: Payer: Self-pay | Admitting: Family Medicine

## 2021-07-08 ENCOUNTER — Telehealth: Payer: Self-pay

## 2021-07-08 NOTE — Chronic Care Management (AMB) (Signed)
[employer] patient assistance program notification:  120- day supply of Rybelsus 14 mg will be filled on 07/21/2021 and should arrive to the office in 10-14 business days. Patient enrollment will expire on 12/22/2021.  [name], [place] Pharmacist Assistant [phone]

## 2021-07-19 ENCOUNTER — Other Ambulatory Visit: Payer: Self-pay | Admitting: Family Medicine

## 2021-07-19 DIAGNOSIS — I10 Essential (primary) hypertension: Secondary | ICD-10-CM

## 2021-08-10 ENCOUNTER — Other Ambulatory Visit: Payer: Self-pay

## 2021-08-10 DIAGNOSIS — R569 Unspecified convulsions: Secondary | ICD-10-CM

## 2021-08-10 DIAGNOSIS — F330 Major depressive disorder, recurrent, mild: Secondary | ICD-10-CM

## 2021-08-10 DIAGNOSIS — I10 Essential (primary) hypertension: Secondary | ICD-10-CM

## 2021-08-10 DIAGNOSIS — R7303 Prediabetes: Secondary | ICD-10-CM

## 2021-08-12 DIAGNOSIS — M7062 Trochanteric bursitis, left hip: Secondary | ICD-10-CM | POA: Diagnosis not present

## 2021-08-17 ENCOUNTER — Telehealth: Payer: Self-pay

## 2021-08-17 NOTE — Chronic Care Management (AMB) (Signed)
[employer] patient assistance program notification:  120- day supply of Rybelsus 14 mg will be filled 07/25/2021 and should arrive to the office in 10-14 business days. Patient has 4  refill remaining and enrollment will expire on 12/22/2021.  The next refill for patient will be fulfilled on 09/04/2021.  [name], [place] Pharmacist Assistant [phone]

## 2021-09-07 ENCOUNTER — Ambulatory Visit (INDEPENDENT_AMBULATORY_CARE_PROVIDER_SITE_OTHER): Payer: Medicare HMO | Admitting: Family Medicine

## 2021-09-07 ENCOUNTER — Encounter: Payer: Self-pay | Admitting: Family Medicine

## 2021-09-07 VITALS — BP 120/88 | HR 66 | Temp 97.7°F | Ht 76.0 in | Wt 323.0 lb

## 2021-09-07 DIAGNOSIS — I10 Essential (primary) hypertension: Secondary | ICD-10-CM

## 2021-09-07 DIAGNOSIS — Q039 Congenital hydrocephalus, unspecified: Secondary | ICD-10-CM | POA: Diagnosis not present

## 2021-09-07 DIAGNOSIS — R569 Unspecified convulsions: Secondary | ICD-10-CM

## 2021-09-07 DIAGNOSIS — R972 Elevated prostate specific antigen [PSA]: Secondary | ICD-10-CM | POA: Diagnosis not present

## 2021-09-07 DIAGNOSIS — F330 Major depressive disorder, recurrent, mild: Secondary | ICD-10-CM

## 2021-09-07 DIAGNOSIS — G2581 Restless legs syndrome: Secondary | ICD-10-CM | POA: Diagnosis not present

## 2021-09-07 DIAGNOSIS — R413 Other amnesia: Secondary | ICD-10-CM

## 2021-09-07 DIAGNOSIS — R69 Illness, unspecified: Secondary | ICD-10-CM | POA: Diagnosis not present

## 2021-09-07 DIAGNOSIS — R7303 Prediabetes: Secondary | ICD-10-CM

## 2021-09-07 DIAGNOSIS — E291 Testicular hypofunction: Secondary | ICD-10-CM

## 2021-09-07 DIAGNOSIS — E2740 Unspecified adrenocortical insufficiency: Secondary | ICD-10-CM | POA: Diagnosis not present

## 2021-09-07 NOTE — Patient Instructions (Signed)
Recommend tetanus (TDAP) and shingrix vaccines.

## 2021-09-07 NOTE — Progress Notes (Signed)
Subjective:  Patient ID: [name], male    DOB: 1965-02-10  Age: 56 y.o. MRN: [id number]  Chief Complaint  Patient presents with   Hypertension   HPI: Patient is a 56 year old white male with past medical history of prediabetes, seizure disorder hypertension, morbid obesity, congenital hydrocephalus, adrenal insufficiency hyponatremia, and memory loss.  Patient is doing very well.  He is working hard on his diet.  With the help of Rybelsus 14 mg once daily he has lost nearly 35 pounds in the last year.  Patient sees [name], [place] for endocrinology.  He sees Dr. [name] for neurology.  Patient has had no recent seizures.  Currently on Keppra 1000 mg twice daily.  Has depression with anxiety, which is well controlled on Effexor extended release 75 mg 3 capsules in AM.  Patient has not been needing his clonazepam 0.5 mg once daily as needed for anxiety.  Restless leg syndrome currently on Mirapex 0.125 mg 1 tablet twice daily.  Hypertension is well controlled on valsartan 320 mg once daily and amlodipine 10 mg daily.   On aspirin 81 mg once daily.  Adrenal insufficiency/hyponatremia: Currently patient is on hydrocortisone 10 mg once daily and sodium chloride 1000 mg 3 times a day. Sees [name], NP.   Memory loss: Currently on Namenda 10 mg twice daily.   Current Outpatient Medications on File Prior to Visit  Medication Sig Dispense Refill   amLODipine (NORVASC) 10 MG tablet Take 1 tablet (10 mg total) by mouth daily. 90 tablet 1   aspirin 81 MG tablet Take 81 mg by mouth daily.     clonazePAM (KLONOPIN) 0.5 MG tablet Take 1 tablet by mouth once daily 30 tablet 3   fludrocortisone (FLORINEF) 0.1 MG tablet Take 0.2 mg by mouth 2 (two) times daily.     hydrocortisone (CORTEF) 10 MG tablet Take 10 mg by mouth daily.     levETIRAcetam (KEPPRA) 1000 MG tablet Take 1 tablet (1,000 mg total) by mouth 2 (two) times daily. 180 tablet 3   Melatonin 5 MG TABS Take by mouth at bedtime.      memantine (NAMENDA) 10 MG tablet Take 1 tablet (10 mg total) by mouth 2 (two) times daily. 180 tablet 3   mirabegron ER (MYRBETRIQ) 25 MG TB24 tablet Take 1 tablet (25 mg total) by mouth daily. 30 tablet 5   pramipexole (MIRAPEX) 0.125 MG tablet Take 1 tablet (0.125 mg total) by mouth 2 (two) times daily. 180 tablet 3   Semaglutide 14 MG TABS Take by mouth.     SODIUM CHLORIDE PO Take 1,000 mg by mouth in the morning, at noon, and at bedtime.     valsartan (DIOVAN) 320 MG tablet Take 1 tablet by mouth once daily 90 tablet 0   venlafaxine XR (EFFEXOR-XR) 75 MG 24 hr capsule TAKE 3 CAPSULES BY MOUTH ONCE DAILY 270 capsule 0   No current facility-administered medications on file prior to visit.   Past Medical History:  Diagnosis Date   Anxiety    Aqueductal stenosis (HCC)    Atrial fibrillation (HCC)    Cardiomyopathy    Carpal tunnel syndrome    Chiari I malformation ([place])    Coronary artery disease    Depression    Headache(784.0)    Hydrocephalus ([place])    Hypertension    Memory loss    Obstructive sleep apnea    Pulmonary embolism (HCC)    Renal calculi    Restless leg syndrome  RLS (restless legs syndrome) 05/04/2020   Salt-wasting syndrome of infancy    Seizure (HCC)    SIADH (syndrome of inappropriate ADH production) ([place])    Syncope    Recurrent, thought to be neurally mediated   Past Surgical History:  Procedure Laterality Date   CHOLECYSTECTOMY     LOOP RECORDER EXPLANT N/A 10/30/2012   Procedure: LOOP RECORDER EXPLANT;  Surgeon: Deboraha Sprang, MD;  Location: [facility] CATH LAB;  Service: Cardiovascular;  Laterality: N/A;   Nissen Fundoplasty     PROSTATE SURGERY     [employer] DM [phone] loop     S/p loop recorder   VENTRICULOPERITONEAL SHUNT Bilateral     Family History  Problem Relation Age of Onset   Migraines Mother    CAD Mother    Hypertension Mother    Endometriosis Sister    Seizures Neg Hx    Social History   Socioeconomic History   Marital  status: Widowed    Spouse name: [name]   Number of children: 0   Years of education: 12   Highest education level: Not on file  Occupational History   Occupation: CUSTOMER SERVICE    Employer: [id number]    Comment: Not working   Tobacco Use   Smoking status: Never   Smokeless tobacco: Never  Vaping Use   Vaping Use: Never used  Substance and Sexual Activity   Alcohol use: No    Alcohol/week: 0.0 standard drinks of alcohol   Drug use: No   Sexual activity: Not Currently  Other Topics Concern   Not on file  Social History Narrative   Patient's wife passed away, no children   Patient is right handed   Education level is high school graduate   Drinks caffeine free drinks   Social Determinants of Radio broadcast assistant Strain: Not on file  Food Insecurity: Not on file  Transportation Needs: Not on file  Physical Activity: Not on file  Stress: Not on file  Social Connections: Not on file    Review of Systems  Constitutional:  Negative for appetite change, fatigue and fever.  HENT:  Negative for congestion, ear pain, sinus pressure and sore throat.   Respiratory:  Negative for cough, chest tightness, shortness of breath and wheezing.   Cardiovascular:  Negative for chest pain and palpitations.  Gastrointestinal:  Negative for abdominal pain, constipation, diarrhea, nausea and vomiting.  Genitourinary:  Negative for dysuria and hematuria.  Musculoskeletal:  Negative for arthralgias, back pain, joint swelling and myalgias.  Skin:  Negative for rash.  Neurological:  Negative for dizziness, weakness and headaches.  Psychiatric/Behavioral:  Negative for dysphoric mood. The patient is not nervous/anxious.      Objective:  BP 120/88 (BP Location: Left Arm, Patient Position: Sitting)   Pulse 66   Temp 97.7 F (36.5 C) (Oral)   Ht '6\' 4"'$  (1.93 m)   Wt (!) 323 lb (146.5 kg)   SpO2 94%   BMI 39.32 kg/m      09/07/2021    9:13 AM 05/05/2021    9:50 AM 04/25/2021   10:55  AM  BP/Weight  Systolic BP 932 671 245  Diastolic BP 88 95 65  Wt. (Lbs) 323 335.5   BMI 39.32 kg/m2 40.84 kg/m2     Physical Exam Vitals reviewed.  Constitutional:      Appearance: Normal appearance. He is normal weight.  Cardiovascular:     Rate and Rhythm: Normal rate and regular rhythm.  Heart sounds: Normal heart sounds.  Pulmonary:     Effort: Pulmonary effort is normal.     Breath sounds: Normal breath sounds.  Abdominal:     General: Abdomen is flat. Bowel sounds are normal.     Palpations: Abdomen is soft.  Neurological:     Mental Status: He is alert and oriented to person, place, and time.  Psychiatric:        Mood and Affect: Mood normal.        Behavior: Behavior normal.     Diabetic Foot Exam - Simple   No data filed      Lab Results  Component Value Date   WBC 9.1 09/07/2021   HGB 14.2 09/07/2021   HCT 41.9 09/07/2021   PLT 373 09/07/2021   GLUCOSE 90 09/07/2021   CHOL 137 09/07/2021   TRIG 93 09/07/2021   HDL 39 (L) 09/07/2021   LDLCALC 80 09/07/2021   ALT 16 09/07/2021   AST 18 09/07/2021   NA 138 09/07/2021   K 4.3 09/07/2021   CL 101 09/07/2021   CREATININE 0.86 09/07/2021   BUN 11 09/07/2021   CO2 19 (L) 09/07/2021   TSH 2.440 03/11/2019   INR 1.02 08/07/2011   HGBA1C 5.9 (H) 09/07/2021      Assessment & Plan:   Problem List Items Addressed This Visit       Cardiovascular and Mediastinum   Essential hypertension - Primary    Well controlled.  No changes to medicines. Continue valsartan 320 mg daily. On aspirin 81 mg once daily. Continue to work on eating a healthy diet and exercise.  Labs drawn today.       Relevant Orders   CBC With Diff/Platelet (Completed)   Comprehensive metabolic panel (Completed)     Endocrine   Adrenal insufficiency ([place])    Management per specialist. Currently patient is on hydrocortisone 10 mg once daily and sodium chloride 1000 mg 3 times a day.      Hypogonadism, male    Management  per specialist.        Nervous and Auditory   Congenital hydrocephalus ([place])    Management per specialist.        Other   Memory loss    The current medical regimen is effective;  continue present plan and medications. Taking Namenda 10 mg twice a day.      Seizures [facility])    He sees Dr. [name] for neurology.  Patient has had no recent seizures.  Currently on Keppra 1000 mg twice daily.      Prediabetes    Hemoglobin A1c 6.1%, 3 month avg of blood sugars, is in prediabetic range.  In order to prevent progression to diabetes, recommend low carb diet and regular exercise      Relevant Orders   Lipid panel (Completed)   Hemoglobin A1c (Completed)   Microalbumin / creatinine urine ratio (Completed)   Morbid obesity ([place])    Recommend continue to work on eating healthy diet and exercise. Comorbidities: hypertension, prediabetes.      Relevant Medications   Semaglutide 14 MG TABS   Mild recurrent major depression ([place])    The current medical regimen is effective;  continue present plan and medications. Effexor 75 mg Take 3 capsules daily.      RLS (restless legs syndrome)    The current medical regimen is effective;  continue present plan and medications. currently on Mirapex 0.125 mg 1 tablet twice daily.  Elevated PSA    Check psa level.       Relevant Orders   PSA (Completed)  .  No orders of the defined types were placed in this encounter.  Total time spent on today's visit was greater than 30 minutes, including both face-to-face time and nonface-to-face time personally spent on review of chart (labs and imaging), discussing labs and goals, discussing further work-up, treatment options, referrals to specialist if needed, reviewing outside records of pertinent, answering patient's questions, and coordinating care.  Orders Placed This Encounter  Procedures   CBC With Diff/Platelet   Comprehensive metabolic panel   Lipid panel   Hemoglobin A1c   PSA    Microalbumin / creatinine urine ratio     Follow-up: Return in about 4 months (around 01/07/2022) for chronic fasting, awv with Shelle Iron, LPN after 86/76/1950.Marland Kitchen  An After Visit Summary was printed and given to the patient.   I,Derrill Bagnell,acting as a Education administrator for Rochel Brome, MD.,have documented all relevant documentation on the behalf of Rochel Brome, MD,as directed by  Rochel Brome, MD while in the presence of Rochel Brome, MD.   I,Normalee Sistare,acting as a scribe for Rochel Brome, MD.,have documented all relevant documentation on the behalf of Rochel Brome, MD,as directed by  Rochel Brome, MD while in the presence of Rochel Brome, MD.    Rochel Brome, MD [place] [phone]

## 2021-09-08 ENCOUNTER — Telehealth: Payer: Self-pay

## 2021-09-08 LAB — CBC WITH DIFF/PLATELET
Basophils Absolute: 0.1 10*3/uL (ref 0.0–0.2)
Basos: 1 %
EOS (ABSOLUTE): 0.2 10*3/uL (ref 0.0–0.4)
Eos: 2 %
Hematocrit: 41.9 % (ref 37.5–51.0)
Hemoglobin: 14.2 g/dL (ref 13.0–17.7)
Immature Grans (Abs): 0 10*3/uL (ref 0.0–0.1)
Immature Granulocytes: 0 %
Lymphocytes Absolute: 1.9 10*3/uL (ref 0.7–3.1)
Lymphs: 21 %
MCH: 28.5 pg (ref 26.6–33.0)
MCHC: 33.9 g/dL (ref 31.5–35.7)
MCV: 84 fL (ref 79–97)
Monocytes Absolute: 0.8 10*3/uL (ref 0.1–0.9)
Monocytes: 9 %
Neutrophils Absolute: 6.1 10*3/uL (ref 1.4–7.0)
Neutrophils: 67 %
Platelets: 373 10*3/uL (ref 150–450)
RBC: 4.98 x10E6/uL (ref 4.14–5.80)
RDW: 13.2 % (ref 11.6–15.4)
WBC: 9.1 10*3/uL (ref 3.4–10.8)

## 2021-09-08 LAB — COMPREHENSIVE METABOLIC PANEL
ALT: 16 IU/L (ref 0–44)
AST: 18 IU/L (ref 0–40)
Albumin/Globulin Ratio: 1.4 (ref 1.2–2.2)
Albumin: 3.9 g/dL (ref 3.8–4.9)
Alkaline Phosphatase: 106 IU/L (ref 44–121)
BUN/Creatinine Ratio: 13 (ref 9–20)
BUN: 11 mg/dL (ref 6–24)
Bilirubin Total: 0.8 mg/dL (ref 0.0–1.2)
CO2: 19 mmol/L — ABNORMAL LOW (ref 20–29)
Calcium: 9 mg/dL (ref 8.7–10.2)
Chloride: 101 mmol/L (ref 96–106)
Creatinine, Ser: 0.86 mg/dL (ref 0.76–1.27)
Globulin, Total: 2.8 g/dL (ref 1.5–4.5)
Glucose: 90 mg/dL (ref 70–99)
Potassium: 4.3 mmol/L (ref 3.5–5.2)
Sodium: 138 mmol/L (ref 134–144)
Total Protein: 6.7 g/dL (ref 6.0–8.5)
eGFR: 102 mL/min/{1.73_m2} (ref >59)

## 2021-09-08 LAB — LIPID PANEL
Chol/HDL Ratio: 3.5 ratio (ref 0.0–5.0)
Cholesterol, Total: 137 mg/dL (ref 100–199)
HDL: 39 mg/dL — ABNORMAL LOW (ref >39)
LDL Chol Calc (NIH): 80 mg/dL (ref 0–99)
Triglycerides: 93 mg/dL (ref 0–149)
VLDL Cholesterol Cal: 18 mg/dL (ref 5–40)

## 2021-09-08 LAB — MICROALBUMIN / CREATININE URINE RATIO
Creatinine, Urine: 133 mg/dL
Microalb/Creat Ratio: 5 mg/g creat (ref 0–29)
Microalbumin, Urine: 6.9 ug/mL

## 2021-09-08 LAB — HEMOGLOBIN A1C
Est. average glucose Bld gHb Est-mCnc: 123 mg/dL
Hgb A1c MFr Bld: 5.9 % — ABNORMAL HIGH (ref 4.8–5.6)

## 2021-09-08 LAB — PSA: Prostate Specific Ag, Serum: 1 ng/mL (ref 0.0–4.0)

## 2021-09-08 NOTE — Chronic Care Management (AMB) (Signed)
Novo Nordisk patient assistance program notification:  120- day supply of Ryblesus 14 mg 30 ct will be filled on 09/28/2021 and should arrive to the office in 10-14 business days.   [name], [place] Pharmacist Assistant [phone]

## 2021-09-10 DIAGNOSIS — N401 Enlarged prostate with lower urinary tract symptoms: Secondary | ICD-10-CM | POA: Insufficient documentation

## 2021-09-10 DIAGNOSIS — R972 Elevated prostate specific antigen [PSA]: Secondary | ICD-10-CM | POA: Insufficient documentation

## 2021-09-10 NOTE — Assessment & Plan Note (Signed)
Management per specialist. 

## 2021-09-10 NOTE — Assessment & Plan Note (Signed)
The current medical regimen is effective;  continue present plan and medications. Taking Namenda 10 mg twice a day.

## 2021-09-10 NOTE — Assessment & Plan Note (Signed)
>>  ASSESSMENT AND PLAN FOR ELEVATED PSA WRITTEN ON 09/10/2021 10:45 PM BY COX, KIRSTEN, MD  Check psa level.

## 2021-09-10 NOTE — Assessment & Plan Note (Signed)
The current medical regimen is effective;  continue present plan and medications. Effexor 75 mg Take 3 capsules daily.

## 2021-09-10 NOTE — Assessment & Plan Note (Signed)
He sees Dr. [name] for neurology.  Patient has had no recent seizures.  Currently on Keppra 1000 mg twice daily.

## 2021-09-10 NOTE — Assessment & Plan Note (Addendum)
Management per specialist. Currently patient is on hydrocortisone 10 mg once daily and sodium chloride 1000 mg 3 times a day.

## 2021-09-10 NOTE — Assessment & Plan Note (Addendum)
The current medical regimen is effective;  continue present plan and medications. currently on Mirapex 0.125 mg 1 tablet twice daily.

## 2021-09-10 NOTE — Assessment & Plan Note (Addendum)
Well controlled.  No changes to medicines. Continue valsartan 320 mg daily. On aspirin 81 mg once daily. Continue to work on eating a healthy diet and exercise.  Labs drawn today.

## 2021-09-10 NOTE — Assessment & Plan Note (Addendum)
Recommend continue to work on eating healthy diet and exercise. Comorbidities: hypertension, prediabetes.

## 2021-09-10 NOTE — Assessment & Plan Note (Signed)
Check psa level.  

## 2021-09-10 NOTE — Assessment & Plan Note (Signed)
Hemoglobin A1c 6.1%, 3 month avg of blood sugars, is in prediabetic range.  In order to prevent progression to diabetes, recommend low carb diet and regular exercise  

## 2021-09-11 ENCOUNTER — Other Ambulatory Visit: Payer: Self-pay | Admitting: Nurse Practitioner

## 2021-09-11 DIAGNOSIS — I10 Essential (primary) hypertension: Secondary | ICD-10-CM

## 2021-10-10 ENCOUNTER — Telehealth: Payer: Self-pay

## 2021-10-10 NOTE — Chronic Care Management (AMB) (Signed)
Novo Nordisk patient assistance program notification:  120- day supply of Rybelsus 14 mg was sent on 09/30/2021 and should arrive to the office in 10-14 business days. Patient has 3  refill remaining and enrollment will expire on 12/22/2021.  The next refill for patient will be fulfilled on 11/12/2021.  [name], [place] Pharmacist Assistant [phone]

## 2021-10-22 ENCOUNTER — Other Ambulatory Visit: Payer: Self-pay | Admitting: Physician Assistant

## 2021-10-22 DIAGNOSIS — F320 Major depressive disorder, single episode, mild: Secondary | ICD-10-CM

## 2021-10-24 DIAGNOSIS — N401 Enlarged prostate with lower urinary tract symptoms: Secondary | ICD-10-CM | POA: Diagnosis not present

## 2021-10-24 DIAGNOSIS — R3915 Urgency of urination: Secondary | ICD-10-CM | POA: Diagnosis not present

## 2021-10-24 DIAGNOSIS — E349 Endocrine disorder, unspecified: Secondary | ICD-10-CM | POA: Diagnosis not present

## 2021-10-26 ENCOUNTER — Other Ambulatory Visit: Payer: Self-pay | Admitting: Physician Assistant

## 2021-10-26 DIAGNOSIS — F320 Major depressive disorder, single episode, mild: Secondary | ICD-10-CM

## 2021-11-04 ENCOUNTER — Other Ambulatory Visit: Payer: Self-pay

## 2021-11-04 DIAGNOSIS — F320 Major depressive disorder, single episode, mild: Secondary | ICD-10-CM

## 2021-11-04 MED ORDER — VENLAFAXINE HCL ER 75 MG PO CP24: 225.0000 mg | ORAL_CAPSULE | Freq: Every day | ORAL | 1 refills | Status: DC

## 2021-11-05 ENCOUNTER — Other Ambulatory Visit: Payer: Self-pay | Admitting: Family Medicine

## 2021-11-05 DIAGNOSIS — I10 Essential (primary) hypertension: Secondary | ICD-10-CM

## 2021-11-24 ENCOUNTER — Ambulatory Visit (INDEPENDENT_AMBULATORY_CARE_PROVIDER_SITE_OTHER): Payer: Medicare HMO

## 2021-11-24 VITALS — BP 122/82 | HR 66 | Resp 16 | Ht 76.0 in | Wt 321.0 lb

## 2021-11-24 DIAGNOSIS — Z0000 Encounter for general adult medical examination without abnormal findings: Secondary | ICD-10-CM

## 2021-11-25 NOTE — Patient Instructions (Signed)
[name] , Thank you for taking time to come for your Medicare Wellness Visit. I appreciate your ongoing commitment to your health goals. Please review the following plan we discussed and let me know if I can assist you in the future.   Screening recommendations/referrals: Colonoscopy: Due (upcoming appointment already scheduled) Recommended yearly ophthalmology/optometry visit for glaucoma screening and checkup Recommended yearly dental visit for hygiene and checkup  Vaccinations: Influenza vaccine: done Pneumococcal vaccine: up-to-date Tdap vaccine: up-to-date Shingles vaccine: 2nd shot due  Advanced directives: Please bring a copy for your medical record   Preventive Care 40-64 Years, Male Preventive care refers to lifestyle choices and visits with your health care provider that can promote health and wellness. What does preventive care include? A yearly physical exam. This is also called an annual well check. Dental exams once or twice a year. Routine eye exams. Ask your health care provider how often you should have your eyes checked. Personal lifestyle choices, including: Daily care of your teeth and gums. Regular physical activity. Eating a healthy diet. Avoiding tobacco and drug use. Limiting alcohol use. Practicing safe sex. Taking low-dose aspirin every day starting at age 62. What happens during an annual well check? The services and screenings done by your health care provider during your annual well check will depend on your age, overall health, lifestyle risk factors, and family history of disease. Counseling  Your health care provider may ask you questions about your: Alcohol use. Tobacco use. Drug use. Emotional well-being. Home and relationship well-being. Sexual activity. Eating habits. Work and work Statistician. Screening  You may have the following tests or measurements: Height, weight, and BMI. Blood pressure. Lipid and cholesterol levels. These  may be checked every 5 years, or more frequently if you are over 22 years old. Skin check. Lung cancer screening. You may have this screening every year starting at age 70 if you have a 30-pack-year history of smoking and currently smoke or have quit within the past 15 years. Fecal occult blood test (FOBT) of the stool. You may have this test every year starting at age 28. Flexible sigmoidoscopy or colonoscopy. You may have a sigmoidoscopy every 5 years or a colonoscopy every 10 years starting at age 80. Prostate cancer screening. Recommendations will vary depending on your family history and other risks. Hepatitis C blood test. Hepatitis B blood test. Sexually transmitted disease (STD) testing. Diabetes screening. This is done by checking your blood sugar (glucose) after you have not eaten for a while (fasting). You may have this done every 1-3 years. Discuss your test results, treatment options, and if necessary, the need for more tests with your health care provider. Vaccines  Your health care provider may recommend certain vaccines, such as: Influenza vaccine. This is recommended every year. Tetanus, diphtheria, and acellular pertussis (Tdap, Td) vaccine. You may need a Td booster every 10 years. Zoster vaccine. You may need this after age 50. Pneumococcal 13-valent conjugate (PCV13) vaccine. You may need this if you have certain conditions and have not been vaccinated. Pneumococcal polysaccharide (PPSV23) vaccine. You may need one or two doses if you smoke cigarettes or if you have certain conditions. Talk to your health care provider about which screenings and vaccines you need and how often you need them. This information is not intended to replace advice given to you by your health care provider. Make sure you discuss any questions you have with your health care provider. Document Released: 02/05/2015 Document Revised: 09/29/2015 Document Reviewed: 11/10/2014  Chartered certified accountant Patient  Education  2017 [place] Prevention in the [facility] can cause injuries. They can happen to people of all ages. There are many things you can do to make your home safe and to help prevent falls. What can I do on the outside of my home? Regularly fix the edges of walkways and driveways and fix any cracks. Remove anything that might make you trip as you walk through a door, such as a raised step or threshold. Trim any bushes or trees on the path to your home. Use bright outdoor lighting. Clear any walking paths of anything that might make someone trip, such as rocks or tools. Regularly check to see if handrails are loose or broken. Make sure that both sides of any steps have handrails. Any raised decks and porches should have guardrails on the edges. Have any leaves, snow, or ice cleared regularly. Use sand or salt on walking paths during winter. Clean up any spills in your garage right away. This includes oil or grease spills. What can I do in the bathroom? Use night lights. Install grab bars by the toilet and in the tub and shower. Do not use towel bars as grab bars. Use non-skid mats or decals in the tub or shower. If you need to sit down in the shower, use a plastic, non-slip stool. Keep the floor dry. Clean up any water that spills on the floor as soon as it happens. Remove soap buildup in the tub or shower regularly. Attach bath mats securely with double-sided non-slip rug tape. Do not have throw rugs and other things on the floor that can make you trip. What can I do in the bedroom? Use night lights. Make sure that you have a light by your bed that is easy to reach. Do not use any sheets or blankets that are too big for your bed. They should not hang down onto the floor. Have a firm chair that has side arms. You can use this for support while you get dressed. Do not have throw rugs and other things on the floor that can make you trip. What can I do in the  kitchen? Clean up any spills right away. Avoid walking on wet floors. Keep items that you use a lot in easy-to-reach places. If you need to reach something above you, use a strong step stool that has a grab bar. Keep electrical cords out of the way. Do not use floor polish or wax that makes floors slippery. If you must use wax, use non-skid floor wax. Do not have throw rugs and other things on the floor that can make you trip. What can I do with my stairs? Do not leave any items on the stairs. Make sure that there are handrails on both sides of the stairs and use them. Fix handrails that are broken or loose. Make sure that handrails are as long as the stairways. Check any carpeting to make sure that it is firmly attached to the stairs. Fix any carpet that is loose or worn. Avoid having throw rugs at the top or bottom of the stairs. If you do have throw rugs, attach them to the floor with carpet tape. Make sure that you have a light switch at the top of the stairs and the bottom of the stairs. If you do not have them, ask someone to add them for you. What else can I do to help prevent falls? Wear shoes that: Do not have  high heels. Have rubber bottoms. Are comfortable and fit you well. Are closed at the toe. Do not wear sandals. If you use a stepladder: Make sure that it is fully opened. Do not climb a closed stepladder. Make sure that both sides of the stepladder are locked into place. Ask someone to hold it for you, if possible. Clearly mark and make sure that you can see: Any grab bars or handrails. First and last steps. Where the edge of each step is. Use tools that help you move around (mobility aids) if they are needed. These include: Canes. Walkers. Scooters. Crutches. Turn on the lights when you go into a dark area. Replace any light bulbs as soon as they burn out. Set up your furniture so you have a clear path. Avoid moving your furniture around. If any of your floors are  uneven, fix them. If there are any pets around you, be aware of where they are. Review your medicines with your doctor. Some medicines can make you feel dizzy. This can increase your chance of falling. Ask your doctor what other things that you can do to help prevent falls. This information is not intended to replace advice given to you by your health care provider. Make sure you discuss any questions you have with your health care provider. Document Released: 11/05/2008 Document Revised: 06/17/2015 Document Reviewed: 02/13/2014 Elsevier Interactive Patient Education  2017 [employer].

## 2021-11-25 NOTE — Progress Notes (Signed)
Subjective:   [name] is a 56 y.o. male who presents for Medicare Annual/Subsequent preventive examination.  This wellness visit is conducted by a nurse.  The patient's medications were reviewed and reconciled since the patient's last visit.  History details were provided by the patient and his father.  The history appears to be reliable.    Patient's last AWV was one year ago.   Medical History: Patient history and Family history was reviewed  Medications, Allergies, and preventative health maintenance was reviewed and updated.   Cardiac Risk Factors include: advanced age (>80mn, >>86women);diabetes mellitus;male gender;obesity (BMI >30kg/m2)     Objective:    Today's Vitals   11/24/21 0900  BP: 122/82  Pulse: 66  Resp: 16  Weight: (!) 321 lb (145.6 kg)  Height: '6\' 4"'$  (1.93 m)   Body mass index is 39.07 kg/m.     11/15/2020    3:06 PM 02/24/2015    2:16 PM 09/10/2014    8:41 AM 05/07/2014    9:48 AM 10/30/2012    8:31 AM  Advanced Directives  Does Patient Have a Medical Advance Directive? No Yes Yes Yes Patient does not have advance directive  Type of [place]porate [place] will    Copy of [place]?   No - copy requested No - copy requested   Would patient like information on creating a medical advance directive? No - Patient declined      Pre-existing out of facility DNR order (yellow form or pink MOST form)     No    Current Medications (verified) Outpatient Encounter Medications as of 11/24/2021  Medication Sig   amLODipine (NORVASC) 10 MG tablet Take 1 tablet by mouth once daily   clonazePAM (KLONOPIN) 0.5 MG tablet Take 1 tablet by mouth once daily   finasteride (PROSCAR) 5 MG tablet Take 5 mg by mouth daily.   fludrocortisone (FLORINEF) 0.1 MG tablet Take 0.2 mg by mouth 2 (two) times daily.   hydrocortisone (CORTEF) 10 MG tablet Take 10 mg by mouth daily.    levETIRAcetam (KEPPRA) 1000 MG tablet Take 1 tablet (1,000 mg total) by mouth 2 (two) times daily.   Melatonin 5 MG TABS Take by mouth at bedtime.   memantine (NAMENDA) 10 MG tablet Take 1 tablet (10 mg total) by mouth 2 (two) times daily.   pramipexole (MIRAPEX) 0.125 MG tablet Take 1 tablet (0.125 mg total) by mouth 2 (two) times daily.   Semaglutide 14 MG TABS Take by mouth.   SODIUM CHLORIDE PO Take 1,000 mg by mouth in the morning, at noon, and at bedtime.   solifenacin (VESICARE) 10 MG tablet Take 10 mg by mouth daily.   valsartan (DIOVAN) 320 MG tablet Take 1 tablet by mouth once daily   venlafaxine XR (EFFEXOR-XR) 75 MG 24 hr capsule Take 3 capsules (225 mg total) by mouth daily.   mirabegron ER (MYRBETRIQ) 25 MG TB24 tablet Take 1 tablet (25 mg total) by mouth daily. (Patient not taking: Reported on 11/24/2021)   [DISCONTINUED] aspirin 81 MG tablet Take 81 mg by mouth daily. (Patient not taking: Reported on 11/24/2021)   No facility-administered encounter medications on file as of 11/24/2021.    Allergies (verified) Omnipaque [iohexol] and Prednisone   History: Past Medical History:  Diagnosis Date   Anxiety    Aqueductal stenosis (HCC)    Atrial fibrillation ([place]    Cardiomyopathy  Carpal tunnel syndrome    Chiari I malformation (HCC)    Coronary artery disease    Depression    Headache(784.0)    Hydrocephalus (HCC)    Hypertension    Memory loss    Obstructive sleep apnea    Pulmonary embolism (HCC)    Renal calculi    Restless leg syndrome    RLS (restless legs syndrome) 05/04/2020   Salt-wasting syndrome of infancy    Seizure (HCC)    SIADH (syndrome of inappropriate ADH production) ([place])    Syncope    Recurrent, thought to be neurally mediated   Past Surgical History:  Procedure Laterality Date   CHOLECYSTECTOMY     LOOP RECORDER EXPLANT N/A 10/30/2012   Procedure: LOOP RECORDER EXPLANT;  Surgeon: Deboraha Sprang, MD;  Location: [facility] CATH LAB;  Service:  Cardiovascular;  Laterality: N/A;   Nissen Fundoplasty     PROSTATE SURGERY     St. Jude DM [id number] loop     S/p loop recorder   VENTRICULOPERITONEAL SHUNT Bilateral    Family History  Problem Relation Age of Onset   Migraines Mother    CAD Mother    Hypertension Mother    Endometriosis Sister    Seizures Neg Hx    Social History   Socioeconomic History   Marital status: Widowed    Spouse name: [name]   Number of children: 0   Years of education: 12   Highest education level: Not on file  Occupational History   Occupation: CUSTOMER SERVICE    Employer: [id number]    Comment: Not working   Tobacco Use   Smoking status: Never   Smokeless tobacco: Never  Vaping Use   Vaping Use: Never used  Substance and Sexual Activity   Alcohol use: No    Alcohol/week: 0.0 standard drinks of alcohol   Drug use: No   Sexual activity: Not Currently  Other Topics Concern   Not on file  Social History Narrative   Patient's wife passed away, no children   Patient is right handed   Education level is high school graduate   Drinks caffeine free drinks   Social Determinants of Health   Financial Resource Strain: Not on file  Food Insecurity: No Food Insecurity (11/25/2021)   Hunger Vital Sign    Worried About Running Out of Food in the Last Year: Never true    Ran Out of Food in the Last Year: Never true  Transportation Needs: No Transportation Needs (11/25/2021)   PRAPARE - Hydrologist (Medical): No    Lack of Transportation (Non-Medical): No  Physical Activity: Sufficiently Active (11/25/2021)   Exercise Vital Sign    Days of Exercise per Week: 7 days    Minutes of Exercise per Session: 30 min  Stress: No Stress Concern Present (11/25/2021)   [place]    Feeling of Stress : Not at all  Social Connections: Not on file    Tobacco Counseling Counseling given: Patient does not use  tobacco products   Clinical Intake:  Pre-visit preparation completed: Yes Pain : No/denies pain   BMI - recorded: 39.07 Nutritional Status: BMI > 30  Obese Diabetes: Yes How often do you need to have someone help you when you read instructions, pamphlets, or other written materials from your doctor or pharmacy?: 3 - Sometimes Interpreter Needed?: No    Activities of Daily Living    11/25/2021  7:48 AM  In your present state of health, do you have any difficulty performing the following activities:  Hearing? 0  Vision? 0  Difficulty concentrating or making decisions? 1  Walking or climbing stairs? 0  Dressing or bathing? 0  Doing errands, shopping? 1  Preparing Food and eating ? N  Using the Toilet? N  In the past six months, have you accidently leaked urine? N  Do you have problems with loss of bowel control? N  Managing your Medications? N  Managing your Finances? Y  Housekeeping or managing your Housekeeping? N    Patient Care Team: Rochel Brome, MD as PCP - General (Family Medicine) Kathrynn Ducking, MD (Inactive) as Consulting Physician (Neurology) Ardine Bjork (Internal Medicine)     Assessment:   This is a routine wellness examination for [place].  Hearing/Vision screen No results found.  Dietary issues and exercise activities discussed: Current Exercise Habits: Home exercise routine, Type of exercise: walking;strength training/weights, Time (Minutes): 40, Frequency (Times/Week): 7, Weekly Exercise (Minutes/Week): 280, Intensity: Moderate   Depression Screen    11/25/2021    7:42 AM 09/07/2021    9:13 AM 04/25/2021   10:28 AM 03/17/2021    1:46 PM 11/15/2020    3:08 PM 06/23/2020   10:23 AM 08/13/2019   10:15 AM  PHQ 2/9 Scores  PHQ - 2 Score 0 0 0 0 0 0 1  PHQ- 9 Score  '3 3   2 5    '$ Fall Risk    11/25/2021    7:48 AM 03/17/2021    1:46 PM 11/15/2020    3:07 PM 08/13/2019   10:15 AM  [place] in the past year? 0 0 0 1   Number falls in past yr: 0 0 0 0  Injury with Fall? 0 0 0 0  Risk for fall due to : No Fall Risks  No Fall Risks   Follow up Falls evaluation completed  Falls evaluation completed;Falls prevention discussed Falls prevention discussed;Falls evaluation completed    FALL RISK PREVENTION PERTAINING TO THE HOME:  Any stairs in or around the home? Yes  If so, are there any without handrails? No  Home free of loose throw rugs in walkways, pet beds, electrical cords, etc? Yes  Adequate lighting in your home to reduce risk of falls? Yes   ASSISTIVE DEVICES UTILIZED TO PREVENT FALLS:  Use of a cane, walker or w/c? No  Grab bars in the bathroom? No  Shower chair or bench in shower? No  Elevated toilet seat or a handicapped toilet? No   Gait steady and fast without use of assistive device  Cognitive Function:    05/05/2021    9:59 AM 08/13/2019   10:22 AM 05/05/2019    2:10 PM 11/04/2018    2:36 PM 07/12/2017    3:01 PM  MMSE - Mini Mental State Exam  Orientation to time '3 5 5 3 5  '$ Orientation to Place '5 5 5 5 4  '$ Registration '3 3 3 3 3  '$ Attention/ Calculation '4 5 5 5 5  '$ Recall '1 1 2 '$ 0 1  Language- name 2 objects '2 2 2 2 2  '$ Language- repeat '1 1 1 1 1  '$ Language- follow 3 step command '3 1 3 3 3  '$ Language- read & follow direction '1 1 1 1 1  '$ Write a sentence '1 1 1 1 1  '$ Copy design 1 1 0 1 1  Copy design-comments  25 animals    Total score '25 26 28 25 27        '$ 11/25/2021    7:51 AM 11/15/2020    3:10 PM  6CIT Screen  What Year? 0 points 0 points  What month? 0 points 0 points  What time? 0 points 0 points  Count back from 20 0 points 0 points  Months in reverse 2 points 2 points  Repeat phrase 2 points 2 points  Total Score 4 points 4 points    Immunizations Immunization History  Administered Date(s) Administered   Influenza Inj Mdck Quad Pf 10/21/2019, 10/25/2020   Influenza-Unspecified 09/28/2021   PFIZER(Purple Top)SARS-COV-2 Vaccination 04/24/2019, 05/23/2019,  12/15/2019   Pfizer Covid-19 Vaccine Bivalent Booster 41yr & up 10/25/2020   Pneumococcal Polysaccharide-23 01/23/2006, 11/18/2019   Tdap 09/28/2021   Zoster Recombinat (Shingrix) 09/28/2021    TDAP status: Up to date  Flu Vaccine status: Up to date  Pneumococcal vaccine status: Up to date  Covid-19 vaccine status: Completed vaccines  Qualifies for Shingles Vaccine? Yes   Zostavax completed No   Shingrix Completed?: No.    Education has been provided regarding the importance of this vaccine. Patient has been advised to call insurance company to determine out of pocket expense if they have not yet received this vaccine. Advised may also receive vaccine at local pharmacy or Health Dept. Verbalized acceptance and understanding.  Screening Tests Health Maintenance  Topic Date Due   Medicare Annual Wellness (AWV)  11/15/2021   COLONOSCOPY (Pts 45-435yrInsurance coverage will need to be confirmed)  11/16/2021   Zoster Vaccines- Shingrix (2 of 2) 11/23/2021   TETANUS/TDAP  09/29/2031   INFLUENZA VACCINE  Completed   COVID-19 Vaccine  Completed   Hepatitis C Screening  Completed   HIV Screening  Completed   HPV VACCINES  Aged Out    Health Maintenance  Health Maintenance Due  Topic Date Due   Medicare Annual Wellness (AWV)  11/15/2021   COLONOSCOPY (Pts 45-4930yrnsurance coverage will need to be confirmed)  11/16/2021   Zoster Vaccines- Shingrix (2 of 2) 11/23/2021    Colorectal cancer screening: Type of screening: Colonoscopy. Completed 11/16/16. Repeat every 5 years  Lung Cancer Screening: (Low Dose CT Chest recommended if Age 87-72-80ars, 30 pack-year currently smoking OR have quit w/in 15years.) does not qualify.   Lung Cancer Screening Referral: N/A  Additional Screening:  Hepatitis C Screening: does qualify; Completed 09/11/12  Dental Screening: Recommended annual dental exams for proper oral hygiene  Community Resource Referral / Chronic Care Management: CRR  required this visit?  No   CCM required this visit?  No      Plan:    1- Discussed diet modifications  I have personally reviewed and noted the following in the patient's chart:   Medical and social history Use of alcohol, tobacco or illicit drugs  Current medications and supplements including opioid prescriptions. Patient is not currently taking opioid prescriptions. Functional ability and status Nutritional status Physical activity Advanced directives List of other physicians Hospitalizations, surgeries, and ER visits in previous 12 months Vitals Screenings to include cognitive, depression, and falls Referrals and appointments  In addition, I have reviewed and discussed with patient certain preventive protocols, quality metrics, and best practice recommendations. A written personalized care plan for preventive services as well as general preventive health recommendations were provided to patient.     KimErie NoePN   11/22/06/3333

## 2021-12-03 ENCOUNTER — Other Ambulatory Visit: Payer: Self-pay | Admitting: Nurse Practitioner

## 2021-12-03 DIAGNOSIS — I10 Essential (primary) hypertension: Secondary | ICD-10-CM

## 2021-12-13 ENCOUNTER — Encounter: Payer: Self-pay | Admitting: Legal Medicine

## 2021-12-13 ENCOUNTER — Ambulatory Visit (INDEPENDENT_AMBULATORY_CARE_PROVIDER_SITE_OTHER): Payer: Medicare HMO | Admitting: Legal Medicine

## 2021-12-13 VITALS — BP 140/86 | HR 64 | Temp 97.3°F | Ht 76.0 in | Wt 328.0 lb

## 2021-12-13 DIAGNOSIS — R600 Localized edema: Secondary | ICD-10-CM

## 2021-12-13 DIAGNOSIS — M79661 Pain in right lower leg: Secondary | ICD-10-CM | POA: Diagnosis not present

## 2021-12-13 DIAGNOSIS — M7989 Other specified soft tissue disorders: Secondary | ICD-10-CM | POA: Diagnosis not present

## 2021-12-13 MED ORDER — HYDROCODONE-ACETAMINOPHEN 10-325 MG PO TABS: 1.0000 | ORAL_TABLET | Freq: Three times a day (TID) | ORAL | 0 refills | Status: AC | PRN

## 2021-12-13 NOTE — Progress Notes (Signed)
Acute Office Visit  Subjective:    Patient ID: [name], male    DOB: Apr 18, 1965, 56 y.o.   MRN: [id number]  Chief Complaint  Patient presents with   Right foot/leg swelling    HPI Patient is in today for Right foot/leg swelling. Patient states he had a fall over two weeks ago. He states that even before the fall that the right foot was already swelling on the top. Patient also states that a few days after the right leg and foot started increasing swelling and redness and that is what brings the patient in today to be seen.  Past Medical History:  Diagnosis Date   Anxiety    Aqueductal stenosis (HCC)    Atrial fibrillation (HCC)    Cardiomyopathy    Carpal tunnel syndrome    Chiari I malformation ([place])    Coronary artery disease    Depression    Headache(784.0)    Hydrocephalus (HCC)    Hypertension    Memory loss    Obstructive sleep apnea    Pulmonary embolism (HCC)    Renal calculi    Restless leg syndrome    RLS (restless legs syndrome) 05/04/2020   Salt-wasting syndrome of infancy    Seizure (HCC)    SIADH (syndrome of inappropriate ADH production) ([place])    Syncope    Recurrent, thought to be neurally mediated    Past Surgical History:  Procedure Laterality Date   CHOLECYSTECTOMY     LOOP RECORDER EXPLANT N/A 10/30/2012   Procedure: LOOP RECORDER EXPLANT;  Surgeon: Deboraha Sprang, MD;  Location: [facility] CATH LAB;  Service: Cardiovascular;  Laterality: N/A;   Nissen Fundoplasty     PROSTATE SURGERY     St. Jude DM [phone] loop     S/p loop recorder   VENTRICULOPERITONEAL SHUNT Bilateral     Family History  Problem Relation Age of Onset   Migraines Mother    CAD Mother    Hypertension Mother    Endometriosis Sister    Seizures Neg Hx     Social History   Socioeconomic History   Marital status: Widowed    Spouse name: [name]   Number of children: 0   Years of education: 12   Highest education level: Not on file  Occupational History    Occupation: CUSTOMER SERVICE    Employer: [id number]    Comment: Not working   Tobacco Use   Smoking status: Never   Smokeless tobacco: Never  Vaping Use   Vaping Use: Never used  Substance and Sexual Activity   Alcohol use: No    Alcohol/week: 0.0 standard drinks of alcohol   Drug use: No   Sexual activity: Not Currently  Other Topics Concern   Not on file  Social History Narrative   Patient's wife passed away, no children   Patient is right handed   Education level is high school graduate   Drinks caffeine free drinks   Social Determinants of Health   Financial Resource Strain: Not on file  Food Insecurity: No Food Insecurity (11/25/2021)   Hunger Vital Sign    Worried About Running Out of Food in the Last Year: Never true    Ran Out of Food in the Last Year: Never true  Transportation Needs: No Transportation Needs (11/25/2021)   PRAPARE - Hydrologist (Medical): No    Lack of Transportation (Non-Medical): No  Physical Activity: Sufficiently Active (11/25/2021)   Exercise Vital Sign  Days of Exercise per Week: 7 days    Minutes of Exercise per Session: 30 min  Stress: No Stress Concern Present (11/25/2021)   [place]    Feeling of Stress : Not at all  Social Connections: Not on file  Intimate Partner Violence: Not on file    Outpatient Medications Prior to Visit  Medication Sig Dispense Refill   amLODipine (NORVASC) 10 MG tablet Take 1 tablet by mouth once daily 90 tablet 0   clonazePAM (KLONOPIN) 0.5 MG tablet Take 1 tablet by mouth once daily 30 tablet 3   finasteride (PROSCAR) 5 MG tablet Take 5 mg by mouth daily.     fludrocortisone (FLORINEF) 0.1 MG tablet Take 0.2 mg by mouth 2 (two) times daily.     hydrocortisone (CORTEF) 10 MG tablet Take 10 mg by mouth daily.     levETIRAcetam (KEPPRA) 1000 MG tablet Take 1 tablet (1,000 mg total) by mouth 2 (two) times daily. 180  tablet 3   Melatonin 5 MG TABS Take by mouth at bedtime.     memantine (NAMENDA) 10 MG tablet Take 1 tablet (10 mg total) by mouth 2 (two) times daily. 180 tablet 3   mirabegron ER (MYRBETRIQ) 25 MG TB24 tablet Take 1 tablet (25 mg total) by mouth daily. 30 tablet 5   pramipexole (MIRAPEX) 0.125 MG tablet Take 1 tablet (0.125 mg total) by mouth 2 (two) times daily. 180 tablet 3   Semaglutide 14 MG TABS Take by mouth.     SODIUM CHLORIDE PO Take 1,000 mg by mouth in the morning, at noon, and at bedtime.     solifenacin (VESICARE) 10 MG tablet Take 10 mg by mouth daily.     valsartan (DIOVAN) 320 MG tablet Take 1 tablet by mouth once daily 90 tablet 0   venlafaxine XR (EFFEXOR-XR) 75 MG 24 hr capsule Take 3 capsules (225 mg total) by mouth daily. 270 capsule 1   No facility-administered medications prior to visit.    Allergies  Allergen Reactions   Omnipaque [Iohexol] Hives   Prednisone Nausea And Vomiting and Other (See Comments)    unknown    Review of Systems  Constitutional:  Negative for appetite change, fatigue and fever.  HENT:  Negative for congestion, ear pain, sinus pressure and sore throat.   Respiratory:  Negative for cough, chest tightness, shortness of breath and wheezing.   Cardiovascular:  Positive for leg swelling (Right foot/leg swelling with rednesss). Negative for chest pain and palpitations.  Gastrointestinal:  Negative for abdominal pain, constipation, diarrhea, nausea and vomiting.  Genitourinary:  Negative for dysuria and hematuria.  Musculoskeletal:  Negative for arthralgias, back pain, joint swelling and myalgias.  Skin:  Negative for rash.  Neurological:  Negative for dizziness, weakness and headaches.  Psychiatric/Behavioral:  Negative for dysphoric mood. The patient is not nervous/anxious.        Objective:    Physical Exam Vitals reviewed.  Constitutional:      Appearance: Normal appearance. He is normal weight.  Cardiovascular:     Rate and  Rhythm: Normal rate and regular rhythm.     Heart sounds: Normal heart sounds.  Pulmonary:     Effort: Pulmonary effort is normal.     Breath sounds: Normal breath sounds.  Abdominal:     General: Abdomen is flat. Bowel sounds are normal.     Palpations: Abdomen is soft.  Neurological:     Mental Status: He is alert  and oriented to person, place, and time.  Psychiatric:        Mood and Affect: Mood normal.        Behavior: Behavior normal.     BP (!) 140/86 (BP Location: Left Arm, Patient Position: Sitting)   Pulse 64   Temp (!) 97.3 F (36.3 C) (Temporal)   Ht _0  (1.93 m)   Wt (!) 328 lb (148.8 kg)   SpO2 98%   BMI 39.93 kg/m  Wt Readings from Last 3 Encounters:  12/13/21 (!) 328 lb (148.8 kg)  11/24/21 (!) 321 lb (145.6 kg)  09/07/21 (!) 323 lb (146.5 kg)    Health Maintenance Due  Topic Date Due   COVID-19 Vaccine (5 - 2023-24 season) 09/23/2021   COLONOSCOPY (Pts 45-54yr Insurance coverage will need to be confirmed)  11/16/2021   Zoster Vaccines- Shingrix (2 of 2) 11/23/2021    There are no preventive care reminders to display for this patient.   Lab Results  Component Value Date   TSH 2.440 03/11/2019   Lab Results  Component Value Date   WBC 9.1 09/07/2021   HGB 14.2 09/07/2021   HCT 41.9 09/07/2021   MCV 84 09/07/2021   PLT 373 09/07/2021   Lab Results  Component Value Date   NA 138 09/07/2021   K 4.3 09/07/2021   CO2 19 (L) 09/07/2021   GLUCOSE 90 09/07/2021   BUN 11 09/07/2021   CREATININE 0.86 09/07/2021   BILITOT 0.8 09/07/2021   ALKPHOS 106 09/07/2021   AST 18 09/07/2021   ALT 16 09/07/2021   PROT 6.7 09/07/2021   ALBUMIN 3.9 09/07/2021   CALCIUM 9.0 09/07/2021   EGFR 102 09/07/2021   Lab Results  Component Value Date   CHOL 137 09/07/2021   Lab Results  Component Value Date   HDL 39 (L) 09/07/2021   Lab Results  Component Value Date   LDLCALC 80 09/07/2021   Lab Results  Component Value Date   TRIG 93 09/07/2021    Lab Results  Component Value Date   CHOLHDL 3.5 09/07/2021   Lab Results  Component Value Date   HGBA1C 5.9 (H) 09/07/2021         Assessment & Plan:  Edema of right foot - Plan: DG Foot Complete Right, HYDROcodone-acetaminophen (NORCO) 10-325 MG tablet Patient fell and now has edema right foot.  Need x-ray to rule out fracture.  Elevate foot above heart.  Edema of right lower leg - Plan: DOPPLER ARTERIAL LEG RIGHT Dopplers negative.  No DVT, elevate leg   follow up : one week  I,Lauren M Auman,acting as a scribe for LReinaldo Meeker MD.,have documented all relevant documentation on the behalf of LReinaldo Meeker MD,as directed by  LReinaldo Meeker MD while in the presence of LReinaldo Meeker MD.   LReinaldo Meeker MD

## 2021-12-14 ENCOUNTER — Other Ambulatory Visit: Payer: Self-pay

## 2021-12-14 DIAGNOSIS — R600 Localized edema: Secondary | ICD-10-CM

## 2021-12-29 ENCOUNTER — Telehealth: Payer: Self-pay

## 2021-12-29 NOTE — Chronic Care Management (AMB) (Signed)
Faxed 2024 re-enrollment application to [employer] patient assistance for [employer].  [name], [place] Pharmacist Assistant [phone]

## 2022-01-06 ENCOUNTER — Telehealth: Payer: Self-pay

## 2022-01-06 NOTE — Telephone Encounter (Signed)
Patients father called this morning stating that Tailor is positive for covid. He tested last night. I spoke with Maudie Mercury, LPN, due to no openings for today she recommended for the patient to go to UC if he is wanting to get medicine for his symptoms or he is able to try something OTC. Patients father was notified.

## 2022-01-11 ENCOUNTER — Encounter: Payer: Self-pay | Admitting: Family Medicine

## 2022-01-11 ENCOUNTER — Ambulatory Visit (INDEPENDENT_AMBULATORY_CARE_PROVIDER_SITE_OTHER): Payer: Medicare HMO | Admitting: Family Medicine

## 2022-01-11 VITALS — BP 138/88 | HR 69 | Temp 97.5°F | Ht 76.0 in | Wt 327.0 lb

## 2022-01-11 DIAGNOSIS — E291 Testicular hypofunction: Secondary | ICD-10-CM

## 2022-01-11 DIAGNOSIS — G2581 Restless legs syndrome: Secondary | ICD-10-CM

## 2022-01-11 DIAGNOSIS — Q039 Congenital hydrocephalus, unspecified: Secondary | ICD-10-CM

## 2022-01-11 DIAGNOSIS — R569 Unspecified convulsions: Secondary | ICD-10-CM

## 2022-01-11 DIAGNOSIS — E2740 Unspecified adrenocortical insufficiency: Secondary | ICD-10-CM

## 2022-01-11 DIAGNOSIS — F330 Major depressive disorder, recurrent, mild: Secondary | ICD-10-CM

## 2022-01-11 DIAGNOSIS — R413 Other amnesia: Secondary | ICD-10-CM

## 2022-01-11 DIAGNOSIS — Z6839 Body mass index (BMI) 39.0-39.9, adult: Secondary | ICD-10-CM | POA: Diagnosis not present

## 2022-01-11 DIAGNOSIS — R69 Illness, unspecified: Secondary | ICD-10-CM | POA: Diagnosis not present

## 2022-01-11 DIAGNOSIS — R7303 Prediabetes: Secondary | ICD-10-CM

## 2022-01-11 DIAGNOSIS — I10 Essential (primary) hypertension: Secondary | ICD-10-CM

## 2022-01-11 MED ORDER — VALSARTAN-HYDROCHLOROTHIAZIDE 320-25 MG PO TABS: 1.0000 | ORAL_TABLET | Freq: Every day | ORAL | 1 refills | Status: DC

## 2022-01-11 NOTE — Patient Instructions (Addendum)
Decrease venlafaxine (effexor xr 75 mg 2 in am x 2 weeks and then decrease to 1 daily in am x 2 weeks and then stop.   Hypertension/swelling in legs: Change valsartan 320 mg to valsartan/hydrochlorothiazide 320/25 mg once daily.  Avoid salt in diet. Elevated legs.

## 2022-01-11 NOTE — Progress Notes (Signed)
Subjective:  Patient ID: [name], male    DOB: Jan 07, 1966  Age: 56 y.o. MRN: [id number]  Chief Complaint  Patient presents with   Diabetes   Hypertension   Hyperlipidemia    Patient is a 56 year old white male with past medical history of prediabetes, seizure disorder, hypertension, morbid obesity, congenital hydrocephalus, adrenal insufficiency, hyponatremia, and memory loss.  Patient is doing very well.  He is working hard on his diet.  With the help of Rybelsus 14 mg once daily, he has lost nearly 35 pounds in the last year.  Patient sees [name], [place] for endocrinology.  He sees Dr. [name] for neurology.  Patient has had no recent seizures.  Currently on Keppra 1000 mg twice daily.  Has depression with anxiety, which is well controlled on Effexor extended release 75 mg 3 capsules in AM.       01/11/2022    4:54 PM 11/25/2021    7:42 AM 09/07/2021    9:13 AM  PHQ9 SCORE ONLY  PHQ-9 Total Score 4 0 3    Restless leg syndrome currently on Mirapex 0.125 mg 1 tablet twice daily. Hypertension is well controlled on valsartan 320 mg once daily and amlodipine 10 mg daily.   On aspirin 81 mg once daily.  Adrenal insufficiency/hyponatremia: Currently patient is on hydrocortisone 10 mg once daily and sodium chloride 1000 mg 3 times a day. Sees [name], NP.   Memory loss: Currently on Namenda 10 mg twice daily.   Current Outpatient Medications on File Prior to Visit  Medication Sig Dispense Refill   amLODipine (NORVASC) 10 MG tablet Take 1 tablet by mouth once daily 90 tablet 0   finasteride (PROSCAR) 5 MG tablet Take 5 mg by mouth daily.     fludrocortisone (FLORINEF) 0.1 MG tablet Take 0.2 mg by mouth 2 (two) times daily.     hydrocortisone (CORTEF) 10 MG tablet Take 10 mg by mouth daily.     levETIRAcetam (KEPPRA) 1000 MG tablet Take 1 tablet (1,000 mg total) by mouth 2 (two) times daily. 180 tablet 3   Melatonin 5 MG TABS Take by mouth at bedtime.     memantine  (NAMENDA) 10 MG tablet Take 1 tablet (10 mg total) by mouth 2 (two) times daily. 180 tablet 3   pramipexole (MIRAPEX) 0.125 MG tablet Take 1 tablet (0.125 mg total) by mouth 2 (two) times daily. 180 tablet 3   Semaglutide 14 MG TABS Take by mouth.     SODIUM CHLORIDE PO Take 1,000 mg by mouth in the morning, at noon, and at bedtime.     solifenacin (VESICARE) 10 MG tablet Take 10 mg by mouth daily.     venlafaxine XR (EFFEXOR-XR) 75 MG 24 hr capsule Take 3 capsules (225 mg total) by mouth daily. 270 capsule 1   No current facility-administered medications on file prior to visit.   Past Medical History:  Diagnosis Date   Anxiety    Aqueductal stenosis (HCC)    Atrial fibrillation (HCC)    Cardiomyopathy    Carpal tunnel syndrome    Chiari I malformation (HCC)    Coronary artery disease    Depression    Headache(784.0)    Hydrocephalus (HCC)    Hypertension    Memory loss    Obstructive sleep apnea    Pulmonary embolism (HCC)    Renal calculi    Restless leg syndrome    RLS (restless legs syndrome) 05/04/2020   Salt-wasting syndrome of infancy  Seizure ([place])    SIADH (syndrome of inappropriate ADH production) ([place])    Syncope    Recurrent, thought to be neurally mediated   Past Surgical History:  Procedure Laterality Date   CHOLECYSTECTOMY     LOOP RECORDER EXPLANT N/A 10/30/2012   Procedure: LOOP RECORDER EXPLANT;  Surgeon: Deboraha Sprang, MD;  Location[facility] CATH LAB;  Service: Cardiovascular;  Laterality: N/A;   Nissen Fundoplasty     PROSTATE SURGERY     St. Jude DM [id number] loop     S/p loop recorder   VENTRICULOPERITONEAL SHUNT Bilateral     Family History  Problem Relation Age of Onset   Migraines Mother    CAD Mother    Hypertension Mother    Endometriosis Sister    Seizures Neg Hx    Social History   Socioeconomic History   Marital status: Widowed    Spouse name: [name]   Number of children: 0   Years of education: 12   Highest education level: Not on  file  Occupational History   Occupation: CUSTOMER SERVICE    Employer: [id number]    Comment: Not working   Tobacco Use   Smoking status: Never   Smokeless tobacco: Never  Vaping Use   Vaping Use: Never used  Substance and Sexual Activity   Alcohol use: No    Alcohol/week: 0.0 standard drinks of alcohol   Drug use: No   Sexual activity: Not Currently  Other Topics Concern   Not on file  Social History Narrative   Patient's wife passed away, no children   Patient is right handed   Education level is high school graduate   Drinks caffeine free drinks   Social Determinants of Health   Financial Resource Strain: Not on file  Food Insecurity: No Food Insecurity (11/25/2021)   Hunger Vital Sign    Worried About Running Out of Food in the Last Year: Never true    Ran Out of Food in the Last Year: Never true  Transportation Needs: No Transportation Needs (11/25/2021)   PRAPARE - Hydrologist (Medical): No    Lack of Transportation (Non-Medical): No  Physical Activity: Sufficiently Active (11/25/2021)   Exercise Vital Sign    Days of Exercise per Week: 7 days    Minutes of Exercise per Session: 30 min  Stress: No Stress Concern Present (11/25/2021)   [place]    Feeling of Stress : Not at all  Social Connections: Not on file    Review of Systems  Constitutional:  Negative for appetite change, fatigue and fever.  HENT:  Negative for congestion, ear pain, sinus pressure and sore throat.   Respiratory:  Negative for cough, chest tightness, shortness of breath and wheezing.   Cardiovascular:  Negative for chest pain and palpitations.  Gastrointestinal:  Negative for abdominal pain, constipation, diarrhea, nausea and vomiting.  Genitourinary:  Negative for dysuria and hematuria.  Musculoskeletal:  Negative for arthralgias, back pain, joint swelling and myalgias.  Skin:  Negative for rash.   Neurological:  Negative for dizziness, weakness and headaches.  Psychiatric/Behavioral:  Negative for dysphoric mood. The patient is not nervous/anxious.      Objective:  BP 138/88 (BP Location: Left Arm, Patient Position: Sitting)   Pulse 69   Temp (!) 97.5 F (36.4 C) (Temporal)   Ht '6\' 4"'$  (1.93 m)   Wt (!) 327 lb (148.3 kg)   SpO2 96%  BMI 39.80 kg/m      01/11/2022    9:20 AM 12/13/2021   11:31 AM 12/13/2021   10:40 AM  BP/Weight  Systolic BP 338 250 539  Diastolic BP 88 86 82  Wt. (Lbs) 327  328  BMI 39.8 kg/m2  39.93 kg/m2    Physical Exam Vitals reviewed.  Constitutional:      Appearance: Normal appearance. He is obese.  Cardiovascular:     Rate and Rhythm: Normal rate and regular rhythm.     Heart sounds: Normal heart sounds.  Pulmonary:     Effort: Pulmonary effort is normal.     Breath sounds: Normal breath sounds.  Abdominal:     General: Abdomen is flat. Bowel sounds are normal.     Palpations: Abdomen is soft.     Tenderness: There is no abdominal tenderness.  Musculoskeletal:     Right lower leg: Edema present.     Left lower leg: Edema present.  Neurological:     Mental Status: He is alert and oriented to person, place, and time.  Psychiatric:        Mood and Affect: Mood normal.        Behavior: Behavior normal.     Diabetic Foot Exam - Simple   Simple Foot Form Diabetic Foot exam was performed with the following findings: Yes 01/11/2022 10:20 AM  Visual Inspection No deformities, no ulcerations, no other skin breakdown bilaterally: Yes Sensation Testing Intact to touch and monofilament testing bilaterally: Yes Pulse Check Posterior Tibialis and Dorsalis pulse intact bilaterally: Yes Comments      Lab Results  Component Value Date   WBC 8.3 01/11/2022   HGB 14.1 01/11/2022   HCT 42.7 01/11/2022   PLT 464 (H) 01/11/2022   GLUCOSE 97 01/11/2022   CHOL 122 01/11/2022   TRIG 82 01/11/2022   HDL 40 01/11/2022   LDLCALC 66  01/11/2022   ALT 25 01/11/2022   AST 21 01/11/2022   NA 134 01/11/2022   K 4.7 01/11/2022   CL 96 01/11/2022   CREATININE 0.77 01/11/2022   BUN 10 01/11/2022   CO2 25 01/11/2022   TSH 2.440 03/11/2019   INR 1.02 08/07/2011   HGBA1C 6.2 (H) 01/11/2022      Assessment & Plan:   Problem List Items Addressed This Visit       Cardiovascular and Mediastinum   Essential hypertension - Primary    Fairly well controlled.  Change to valsartan 320 mg/25 mg once daily.  Continue to work on eating a healthy diet and exercise.  Labs drawn today.        Relevant Medications   valsartan-hydrochlorothiazide (DIOVAN-HCT) 320-25 MG tablet   Other Relevant Orders   CBC with Differential/Platelet (Completed)   Comprehensive metabolic panel (Completed)   Lipid panel (Completed)   Cardiovascular Risk Assessment (Completed)     Endocrine   Adrenal insufficiency ([place])    Management per specialist.  The current medical regimen is effective;  continue present plan and medications.       Hypogonadism, male     Nervous and Auditory   Congenital hydrocephalus ([place])    Management per specialist.  Shunt in place.         Other   Memory loss    Due to congenital hydrocephalus.  Continue namenda.      Seizures (HCC)    Stable.  Management per specialist.        Prediabetes    Recommend continue to work  on eating healthy diet and exercise.       Relevant Orders   Hemoglobin A1c (Completed)   Class 2 severe obesity due to excess calories with serious comorbidity and body mass index (BMI) of 39.0 to 39.9 in adult [facility])    Comorbidities: hyperlipidemia, hypertension, prediabetes Recommend continue to work on eating healthy diet and exercise.       Mild recurrent major depression (HCC)    The current medical regimen is effective;  continue present plan and medications.       RLS (restless legs syndrome)    The current medical regimen is effective;  continue present plan  and medications.      .  Meds ordered this encounter  Medications   valsartan-hydrochlorothiazide (DIOVAN-HCT) 320-25 MG tablet    Sig: Take 1 tablet by mouth daily.    Dispense:  90 tablet    Refill:  1    Orders Placed This Encounter  Procedures   CBC with Differential/Platelet   Comprehensive metabolic panel   Hemoglobin A1c   Lipid panel   Cardiovascular Risk Assessment     Follow-up: Return in about 3 months (around 04/12/2022) for chronic fasting.  An After Visit Summary was printed and given to the patient.    I,Lauren M Auman,acting as a scribe for Rochel Brome, MD.,have documented all relevant documentation on the behalf of Rochel Brome, MD,as directed by  Rochel Brome, MD while in the presence of Rochel Brome, MD.    Rochel Brome, MD [place] [phone]

## 2022-01-12 LAB — COMPREHENSIVE METABOLIC PANEL
ALT: 25 IU/L (ref 0–44)
AST: 21 IU/L (ref 0–40)
Albumin/Globulin Ratio: 1.3 (ref 1.2–2.2)
Albumin: 3.9 g/dL (ref 3.8–4.9)
Alkaline Phosphatase: 96 IU/L (ref 44–121)
BUN/Creatinine Ratio: 13 (ref 9–20)
BUN: 10 mg/dL (ref 6–24)
Bilirubin Total: 0.7 mg/dL (ref 0.0–1.2)
CO2: 25 mmol/L (ref 20–29)
Calcium: 9.7 mg/dL (ref 8.7–10.2)
Chloride: 96 mmol/L (ref 96–106)
Creatinine, Ser: 0.77 mg/dL (ref 0.76–1.27)
Globulin, Total: 3 g/dL (ref 1.5–4.5)
Glucose: 97 mg/dL (ref 70–99)
Potassium: 4.7 mmol/L (ref 3.5–5.2)
Sodium: 134 mmol/L (ref 134–144)
Total Protein: 6.9 g/dL (ref 6.0–8.5)
eGFR: 104 mL/min/{1.73_m2} (ref >59)

## 2022-01-12 LAB — HEMOGLOBIN A1C
Est. average glucose Bld gHb Est-mCnc: 131 mg/dL
Hgb A1c MFr Bld: 6.2 % — ABNORMAL HIGH (ref 4.8–5.6)

## 2022-01-12 LAB — CBC WITH DIFFERENTIAL/PLATELET
Basophils Absolute: 0.1 10*3/uL (ref 0.0–0.2)
Basos: 1 %
EOS (ABSOLUTE): 0.1 10*3/uL (ref 0.0–0.4)
Eos: 2 %
Hematocrit: 42.7 % (ref 37.5–51.0)
Hemoglobin: 14.1 g/dL (ref 13.0–17.7)
Immature Grans (Abs): 0.1 10*3/uL (ref 0.0–0.1)
Immature Granulocytes: 1 %
Lymphocytes Absolute: 1.5 10*3/uL (ref 0.7–3.1)
Lymphs: 18 %
MCH: 27.4 pg (ref 26.6–33.0)
MCHC: 33 g/dL (ref 31.5–35.7)
MCV: 83 fL (ref 79–97)
Monocytes Absolute: 0.9 10*3/uL (ref 0.1–0.9)
Monocytes: 11 %
Neutrophils Absolute: 5.7 10*3/uL (ref 1.4–7.0)
Neutrophils: 67 %
Platelets: 464 10*3/uL — ABNORMAL HIGH (ref 150–450)
RBC: 5.14 x10E6/uL (ref 4.14–5.80)
RDW: 13.4 % (ref 11.6–15.4)
WBC: 8.3 10*3/uL (ref 3.4–10.8)

## 2022-01-12 LAB — LIPID PANEL
Chol/HDL Ratio: 3.1 ratio (ref 0.0–5.0)
Cholesterol, Total: 122 mg/dL (ref 100–199)
HDL: 40 mg/dL (ref >39)
LDL Chol Calc (NIH): 66 mg/dL (ref 0–99)
Triglycerides: 82 mg/dL (ref 0–149)
VLDL Cholesterol Cal: 16 mg/dL (ref 5–40)

## 2022-01-12 LAB — CARDIOVASCULAR RISK ASSESSMENT

## 2022-01-12 NOTE — Progress Notes (Signed)
Blood count abnormal.  Platelets a little high. Liver function normal.  Kidney function normal.  Cholesterol: Good HBA1C: 6.2.  Stable.

## 2022-01-17 NOTE — Assessment & Plan Note (Signed)
The current medical regimen is effective;  continue present plan and medications.  

## 2022-01-17 NOTE — Assessment & Plan Note (Signed)
Recommend continue to work on eating healthy diet and exercise.  

## 2022-01-17 NOTE — Assessment & Plan Note (Addendum)
Comorbidities: hyperlipidemia, hypertension, prediabetes Recommend continue to work on eating healthy diet and exercise.

## 2022-01-17 NOTE — Assessment & Plan Note (Signed)
Management per specialist The current medical regimen is effective;  continue present plan and medications.  

## 2022-01-17 NOTE — Assessment & Plan Note (Addendum)
Due to congenital hydrocephalus.  Continue namenda.

## 2022-01-17 NOTE — Assessment & Plan Note (Signed)
Fairly well controlled.  Change to valsartan 320 mg/25 mg once daily.  Continue to work on eating a healthy diet and exercise.  Labs drawn today.

## 2022-01-17 NOTE — Assessment & Plan Note (Signed)
Management per specialist.  Shunt in place.

## 2022-01-17 NOTE — Assessment & Plan Note (Signed)
Stable. Management per specialist.   

## 2022-02-01 DIAGNOSIS — K649 Unspecified hemorrhoids: Secondary | ICD-10-CM | POA: Diagnosis not present

## 2022-02-01 DIAGNOSIS — R7303 Prediabetes: Secondary | ICD-10-CM | POA: Diagnosis not present

## 2022-02-01 DIAGNOSIS — K5790 Diverticulosis of intestine, part unspecified, without perforation or abscess without bleeding: Secondary | ICD-10-CM | POA: Diagnosis not present

## 2022-02-14 ENCOUNTER — Telehealth: Payer: Self-pay

## 2022-02-14 NOTE — Telephone Encounter (Signed)
Pts father was notified.

## 2022-02-14 NOTE — Telephone Encounter (Signed)
Called patient to make him aware that his patient assistance has arrived for Rybelsus and did not get no answer also patient has no voicemail set up so unable to leave message for patient to call back.

## 2022-02-15 DIAGNOSIS — K5730 Diverticulosis of large intestine without perforation or abscess without bleeding: Secondary | ICD-10-CM | POA: Diagnosis not present

## 2022-02-15 DIAGNOSIS — Z1211 Encounter for screening for malignant neoplasm of colon: Secondary | ICD-10-CM | POA: Diagnosis not present

## 2022-02-15 DIAGNOSIS — Z09 Encounter for follow-up examination after completed treatment for conditions other than malignant neoplasm: Secondary | ICD-10-CM | POA: Diagnosis not present

## 2022-02-15 DIAGNOSIS — K648 Other hemorrhoids: Secondary | ICD-10-CM | POA: Diagnosis not present

## 2022-02-15 DIAGNOSIS — I10 Essential (primary) hypertension: Secondary | ICD-10-CM | POA: Diagnosis not present

## 2022-02-15 DIAGNOSIS — Z86010 Personal history of colonic polyps: Secondary | ICD-10-CM | POA: Diagnosis not present

## 2022-02-15 LAB — HM COLONOSCOPY

## 2022-02-16 ENCOUNTER — Encounter: Payer: Self-pay | Admitting: Family Medicine

## 2022-02-20 NOTE — Telephone Encounter (Signed)
Father Joneen Boers Leeb picked up Rybelsus  02/20/22

## 2022-03-27 ENCOUNTER — Other Ambulatory Visit: Payer: Self-pay

## 2022-03-27 DIAGNOSIS — I10 Essential (primary) hypertension: Secondary | ICD-10-CM

## 2022-03-27 MED ORDER — AMLODIPINE BESYLATE 10 MG PO TABS: 10.0000 mg | ORAL_TABLET | Freq: Every day | ORAL | 0 refills | Status: DC

## 2022-04-06 DIAGNOSIS — Z125 Encounter for screening for malignant neoplasm of prostate: Secondary | ICD-10-CM | POA: Diagnosis not present

## 2022-04-06 DIAGNOSIS — R3915 Urgency of urination: Secondary | ICD-10-CM | POA: Diagnosis not present

## 2022-04-16 NOTE — Assessment & Plan Note (Signed)
Due to congenital hydrocephalus.  Continue namenda. 

## 2022-04-16 NOTE — Progress Notes (Signed)
Subjective:  Patient ID: [name], male    DOB: [birth year]  Age: 57 y.o. MRN: [id number]  Chief Complaint  Patient presents with   Hypertension   Prediabetes    HPI Patient is a 57 year-old white male with past medical history of prediabetes, seizure disorder hypertension, morbid obesity, congenital hydrocephalus, adrenal insufficiency hyponatremia, and memory loss.  Patient is doing very well.  He is working hard on his diet.  With the help of Rybelsus 14 mg once daily. Patient sees [name], [place] for endocrinology.  He sees Dr. Jannifer [name] for neurology.  Patient has had no recent seizures.  Currently on Keppra 1000 mg twice daily.  Has depression with anxiety, which is well controlled on Effexor extended release 75 mg 3 capsules in AM.    Restless leg syndrome currently on Mirapex 0.125 mg 1 tablet twice daily.  Hypertension is well controlled on valsartan 320-25 mg once daily and amlodipine 10 mg daily.    Adrenal insufficiency/hyponatremia: Currently patient is on hydrocortisone 10 mg once daily and sodium chloride 1000 mg 3 times a day. Sees Philip Jefferson, NP.   Memory loss: Currently on Namenda 10 mg twice daily.     04/17/2022    9:28 AM 01/11/2022    4:54 PM 11/25/2021    7:42 AM 09/07/2021    9:13 AM 04/25/2021   10:28 AM  Depression screen PHQ 2/9  Decreased Interest 0 2 0 0 0  Down, Depressed, Hopeless 0 0 0 0 0  PHQ - 2 Score 0 2 0 0 0  Altered sleeping 0 1  0 0  Tired, decreased energy 0 0  0 0  Change in appetite 1 1  1 1   Feeling bad or failure about yourself  0 0  0 0  Trouble concentrating 0 0  2 2  Moving slowly or fidgety/restless 0 0  0 0  Suicidal thoughts 0 0  0 0  PHQ-9 Score 1 4  3 3   Difficult doing work/chores Not difficult at all Not difficult at all  Not difficult at all Somewhat difficult         08/13/2019   10:15 AM 11/15/2020    3:07 PM 03/17/2021    1:46 PM 11/25/2021    7:48 AM 04/17/2022    9:28 AM  Fall Risk  Falls in the past  year? 1 0 0 0 0  Was there an injury with Fall? 0 0 0 0 0  Fall Risk Category Calculator 1 0 0 0 0  Fall Risk Category (Retired) Low Low Low Low   (RETIRED) Patient Fall Risk Level Low fall risk Low fall risk Low fall risk Low fall risk   Patient at Risk for Falls Due to  No Fall Risks  No Fall Risks No Fall Risks  Fall risk Follow up Falls prevention discussed;Falls evaluation completed Falls evaluation completed;Falls prevention discussed  Falls evaluation completed Falls evaluation completed      Review of Systems  Constitutional:  Negative for chills, diaphoresis, fatigue and fever.  HENT:  Negative for congestion, ear pain (Decreased hearing in left.) and sore throat.   Respiratory:  Negative for cough and shortness of breath.   Cardiovascular:  Negative for chest pain and leg swelling.  Gastrointestinal:  Negative for abdominal pain, constipation, diarrhea, nausea and vomiting.  Genitourinary:  Negative for dysuria and urgency.  Musculoskeletal:  Negative for arthralgias and myalgias.  Neurological:  Negative for dizziness and headaches.  Psychiatric/Behavioral:  Negative  for dysphoric mood.     Current Outpatient Medications on File Prior to Visit  Medication Sig Dispense Refill   amLODipine (NORVASC) 10 MG tablet Take 1 tablet (10 mg total) by mouth daily. 90 tablet 0   finasteride (PROSCAR) 5 MG tablet Take 5 mg by mouth daily.     fludrocortisone (FLORINEF) 0.1 MG tablet Take 0.2 mg by mouth 2 (two) times daily.     hydrocortisone (CORTEF) 10 MG tablet Take 10 mg by mouth daily.     levETIRAcetam (KEPPRA) 1000 MG tablet Take 1 tablet (1,000 mg total) by mouth 2 (two) times daily. 180 tablet 3   Melatonin 5 MG TABS Take by mouth at bedtime.     memantine (NAMENDA) 10 MG tablet Take 1 tablet (10 mg total) by mouth 2 (two) times daily. 180 tablet 3   pramipexole (MIRAPEX) 0.125 MG tablet Take 1 tablet (0.125 mg total) by mouth 2 (two) times daily. 180 tablet 3   Semaglutide 14  MG TABS Take by mouth.     SODIUM CHLORIDE PO Take 1,000 mg by mouth in the morning, at noon, and at bedtime.     solifenacin (VESICARE) 10 MG tablet Take 10 mg by mouth daily.     valsartan-hydrochlorothiazide (DIOVAN-HCT) 320-25 MG tablet Take 1 tablet by mouth daily. 90 tablet 1   venlafaxine XR (EFFEXOR-XR) 75 MG 24 hr capsule Take 3 capsules (225 mg total) by mouth daily. 270 capsule 1   No current facility-administered medications on file prior to visit.   Past Medical History:  Diagnosis Date   Anxiety    Aqueductal stenosis (HCC)    Atrial fibrillation (HCC)    Cardiomyopathy    Carpal tunnel syndrome    Chiari I malformation ([place])    Coronary artery disease    Depression    Headache(784.0)    Hydrocephalus (HCC)    Hypertension    Memory loss    Obstructive sleep apnea    Pulmonary embolism (HCC)    Renal calculi    Restless leg syndrome    RLS (restless legs syndrome) 05/04/2020   Salt-wasting syndrome of infancy    Seizure (HCC)    SIADH (syndrome of inappropriate ADH production) ([place])    Syncope    Recurrent, thought to be neurally mediated   Past Surgical History:  Procedure Laterality Date   CHOLECYSTECTOMY     LOOP RECORDER EXPLANT N/A 10/30/2012   Procedure: LOOP RECORDER EXPLANT;  Surgeon: Deboraha Sprang, MD;  Location: [facility] CATH LAB;  Service: Cardiovascular;  Laterality: N/A;   Nissen Fundoplasty     PROSTATE SURGERY     St. Jude DM [phone] loop     S/p loop recorder   VENTRICULOPERITONEAL SHUNT Bilateral     Family History  Problem Relation Age of Onset   Migraines Mother    CAD Mother    Hypertension Mother    Endometriosis Sister    Seizures Neg Hx    Social History   Socioeconomic History   Marital status: Widowed    Spouse name: [name]   Number of children: 0   Years of education: 12   Highest education level: Not on file  Occupational History   Occupation: CUSTOMER SERVICE    Employer: [id number]    Comment: Not working   Tobacco Use    Smoking status: Never   Smokeless tobacco: Never  Vaping Use   Vaping Use: Never used  Substance and Sexual Activity   Alcohol use: No  Alcohol/week: 0.0 standard drinks of alcohol   Drug use: No   Sexual activity: Not Currently  Other Topics Concern   Not on file  Social History Narrative   Patient's wife passed away, no children   Patient is right handed   Education level is high school graduate   Drinks caffeine free drinks   Social Determinants of Health   Financial Resource Strain: Low Risk  (04/17/2022)   Overall Financial Resource Strain (CARDIA)    Difficulty of Paying Living Expenses: Not hard at all  Food Insecurity: No Food Insecurity (11/25/2021)   Hunger Vital Sign    Worried About Running Out of Food in the Last Year: Never true    Ran Out of Food in the Last Year: Never true  Transportation Needs: No Transportation Needs (11/25/2021)   PRAPARE - Hydrologist (Medical): No    Lack of Transportation (Non-Medical): No  Physical Activity: Sufficiently Active (11/25/2021)   Exercise Vital Sign    Days of Exercise per Week: 7 days    Minutes of Exercise per Session: 30 min  Stress: No Stress Concern Present (11/25/2021)   [place]    Feeling of Stress : Not at all  Social Connections: Not on file    Objective:  BP 134/82   Pulse 74   Temp (!) 97.4 F (36.3 C)   Ht 6\' 4"  (1.93 m)   Wt (!) 334 lb (151.5 kg)   SpO2 98%   BMI 40.66 kg/m      04/17/2022    9:25 AM 01/11/2022    9:20 AM 12/13/2021   11:31 AM  BP/Weight  Systolic BP [id number]  Diastolic BP 82 88 86  Wt. (Lbs) 334 327   BMI 40.66 kg/m2 39.8 kg/m2     Physical Exam Vitals reviewed.  Constitutional:      Appearance: Normal appearance. He is obese.  HENT:     Right Ear: There is impacted cerumen.     Left Ear: There is impacted cerumen.  Neck:     Vascular: No carotid bruit.   Cardiovascular:     Rate and Rhythm: Normal rate and regular rhythm.     Pulses: Normal pulses.     Heart sounds: Normal heart sounds.  Pulmonary:     Effort: Pulmonary effort is normal.     Breath sounds: Normal breath sounds. No wheezing, rhonchi or rales.  Abdominal:     General: Bowel sounds are normal.     Palpations: Abdomen is soft.     Tenderness: There is no abdominal tenderness.  Neurological:     Mental Status: He is alert.  Psychiatric:        Mood and Affect: Mood normal.        Behavior: Behavior normal.     Diabetic Foot Exam - Simple   No data filed      Lab Results  Component Value Date   WBC 8.3 01/11/2022   HGB 14.1 01/11/2022   HCT 42.7 01/11/2022   PLT 464 (H) 01/11/2022   GLUCOSE 97 01/11/2022   CHOL 122 01/11/2022   TRIG 82 01/11/2022   HDL 40 01/11/2022   LDLCALC 66 01/11/2022   ALT 25 01/11/2022   AST 21 01/11/2022   NA 134 01/11/2022   K 4.7 01/11/2022   CL 96 01/11/2022   CREATININE 0.77 01/11/2022   BUN 10 01/11/2022   CO2 25 01/11/2022  TSH 2.440 03/11/2019   INR 1.02 08/07/2011   HGBA1C 6.2 (H) 01/11/2022      Assessment & Plan:    Essential hypertension Assessment & Plan: Fairly well controlled.  Change to valsartan 320 mg/25 mg once daily, amlodipine 10 mg daily. Continue to work on eating a healthy diet and exercise.  Labs drawn today.    Orders: -     Comprehensive metabolic panel -     Lipid panel -     CBC with Differential/Platelet -     TSH  Adrenal insufficiency ([place]) Assessment & Plan: Management per specialist.  The current medical regimen is effective;  continue present plan and medications.    Memory loss Assessment & Plan: Due to congenital hydrocephalus.  Continue namenda.   Prediabetes Assessment & Plan: Hemoglobin A1c 6.2%, 3 month avg of blood sugars, is in prediabetic range.  In order to prevent progression to diabetes, recommend low carb diet and regular exercise   Orders: -      Hemoglobin A1c  RLS (restless legs syndrome) Assessment & Plan: The current medical regimen is effective;  continue present plan and medications. Taking Mirapex 0.125 mg BID.   Bilateral impacted cerumen Assessment & Plan: Irrigation BL:    Congenital hydrocephalus (HCC) Assessment & Plan: Management per specialist.     BMI 40.0-44.9, adult [facility]) Assessment & Plan: Recommend continue to work on eating healthy diet and exercise.    Morbid obesity ([place]) Assessment & Plan: Recommend continue to work on eating healthy diet and exercise.       No orders of the defined types were placed in this encounter.   Orders Placed This Encounter  Procedures   Comprehensive metabolic panel   Hemoglobin A1c   Lipid panel   CBC with Differential/Platelet   TSH     Follow-up: Return in about 6 months (around 10/18/2022) for chronic fasting.   I,Marla I Leal-Borjas,acting as a scribe for Rochel Brome, MD.,have documented all relevant documentation on the behalf of Rochel Brome, MD,as directed by  Rochel Brome, MD while in the presence of Rochel Brome, MD.   An After Visit Summary was printed and given to the patient.  I attest that I have reviewed this visit and agree with the plan scribed by my staff.   Rochel Brome, MD [facility] [phone]

## 2022-04-16 NOTE — Assessment & Plan Note (Signed)
Management per specialist The current medical regimen is effective;  continue present plan and medications.  

## 2022-04-16 NOTE — Assessment & Plan Note (Signed)
Hemoglobin A1c 6.2%, 3 month avg of blood sugars, is in prediabetic range.  In order to prevent progression to diabetes, recommend low carb diet and regular exercise  

## 2022-04-16 NOTE — Assessment & Plan Note (Signed)
Fairly well controlled.  Change to valsartan 320 mg/25 mg once daily, amlodipine 10 mg daily. Continue to work on eating a healthy diet and exercise.  Labs drawn today.

## 2022-04-16 NOTE — Assessment & Plan Note (Signed)
The current medical regimen is effective;  continue present plan and medications. Taking Mirapex 0.125 mg BID.

## 2022-04-17 ENCOUNTER — Encounter: Payer: Self-pay | Admitting: Family Medicine

## 2022-04-17 ENCOUNTER — Ambulatory Visit (INDEPENDENT_AMBULATORY_CARE_PROVIDER_SITE_OTHER): Payer: Medicare HMO | Admitting: Family Medicine

## 2022-04-17 VITALS — BP 134/82 | HR 74 | Temp 97.4°F | Ht 76.0 in | Wt 334.0 lb

## 2022-04-17 DIAGNOSIS — R413 Other amnesia: Secondary | ICD-10-CM

## 2022-04-17 DIAGNOSIS — I10 Essential (primary) hypertension: Secondary | ICD-10-CM | POA: Diagnosis not present

## 2022-04-17 DIAGNOSIS — Q039 Congenital hydrocephalus, unspecified: Secondary | ICD-10-CM

## 2022-04-17 DIAGNOSIS — H6123 Impacted cerumen, bilateral: Secondary | ICD-10-CM

## 2022-04-17 DIAGNOSIS — R7303 Prediabetes: Secondary | ICD-10-CM

## 2022-04-17 DIAGNOSIS — G2581 Restless legs syndrome: Secondary | ICD-10-CM | POA: Diagnosis not present

## 2022-04-17 DIAGNOSIS — E2740 Unspecified adrenocortical insufficiency: Secondary | ICD-10-CM

## 2022-04-17 DIAGNOSIS — Z6841 Body Mass Index (BMI) 40.0 and over, adult: Secondary | ICD-10-CM | POA: Diagnosis not present

## 2022-04-17 NOTE — Assessment & Plan Note (Signed)
Recommend continue to work on eating healthy diet and exercise.  

## 2022-04-17 NOTE — Assessment & Plan Note (Signed)
Irrigation BL:

## 2022-04-17 NOTE — Assessment & Plan Note (Signed)
Management per specialist. 

## 2022-04-18 LAB — CBC WITH DIFFERENTIAL/PLATELET
Basophils Absolute: 0.1 10*3/uL (ref 0.0–0.2)
Basos: 1 %
EOS (ABSOLUTE): 0.1 10*3/uL (ref 0.0–0.4)
Eos: 2 %
Hematocrit: 45.2 % (ref 37.5–51.0)
Hemoglobin: 14.6 g/dL (ref 13.0–17.7)
Immature Grans (Abs): 0 10*3/uL (ref 0.0–0.1)
Immature Granulocytes: 0 %
Lymphocytes Absolute: 2.2 10*3/uL (ref 0.7–3.1)
Lymphs: 28 %
MCH: 27.2 pg (ref 26.6–33.0)
MCHC: 32.3 g/dL (ref 31.5–35.7)
MCV: 84 fL (ref 79–97)
Monocytes Absolute: 0.9 10*3/uL (ref 0.1–0.9)
Monocytes: 11 %
Neutrophils Absolute: 4.6 10*3/uL (ref 1.4–7.0)
Neutrophils: 58 %
Platelets: 359 10*3/uL (ref 150–450)
RBC: 5.36 x10E6/uL (ref 4.14–5.80)
RDW: 14.1 % (ref 11.6–15.4)
WBC: 7.9 10*3/uL (ref 3.4–10.8)

## 2022-04-18 LAB — COMPREHENSIVE METABOLIC PANEL
ALT: 15 IU/L (ref 0–44)
AST: 22 IU/L (ref 0–40)
Albumin/Globulin Ratio: 1.4 (ref 1.2–2.2)
Albumin: 4 g/dL (ref 3.8–4.9)
Alkaline Phosphatase: 82 IU/L (ref 44–121)
BUN/Creatinine Ratio: 14 (ref 9–20)
BUN: 14 mg/dL (ref 6–24)
Bilirubin Total: 0.6 mg/dL (ref 0.0–1.2)
CO2: 22 mmol/L (ref 20–29)
Calcium: 9.7 mg/dL (ref 8.7–10.2)
Chloride: 101 mmol/L (ref 96–106)
Creatinine, Ser: 1.01 mg/dL (ref 0.76–1.27)
Globulin, Total: 2.8 g/dL (ref 1.5–4.5)
Glucose: 99 mg/dL (ref 70–99)
Potassium: 5.1 mmol/L (ref 3.5–5.2)
Sodium: 141 mmol/L (ref 134–144)
Total Protein: 6.8 g/dL (ref 6.0–8.5)
eGFR: 87 mL/min/{1.73_m2} (ref >59)

## 2022-04-18 LAB — HEMOGLOBIN A1C
Est. average glucose Bld gHb Est-mCnc: 126 mg/dL
Hgb A1c MFr Bld: 6 % — ABNORMAL HIGH (ref 4.8–5.6)

## 2022-04-18 LAB — LIPID PANEL
Chol/HDL Ratio: 3.5 ratio (ref 0.0–5.0)
Cholesterol, Total: 144 mg/dL (ref 100–199)
HDL: 41 mg/dL (ref >39)
LDL Chol Calc (NIH): 85 mg/dL (ref 0–99)
Triglycerides: 95 mg/dL (ref 0–149)
VLDL Cholesterol Cal: 18 mg/dL (ref 5–40)

## 2022-04-18 LAB — TSH: TSH: 3.59 u[IU]/mL (ref 0.450–4.500)

## 2022-04-18 LAB — CARDIOVASCULAR RISK ASSESSMENT

## 2022-04-24 ENCOUNTER — Encounter: Payer: Self-pay | Admitting: Family Medicine

## 2022-04-25 ENCOUNTER — Encounter: Payer: Self-pay | Admitting: Psychology

## 2022-04-25 ENCOUNTER — Ambulatory Visit: Payer: Medicare HMO | Admitting: Neurology

## 2022-04-25 ENCOUNTER — Telehealth: Payer: Self-pay | Admitting: Neurology

## 2022-04-25 ENCOUNTER — Encounter: Payer: Self-pay | Admitting: Neurology

## 2022-04-25 VITALS — BP 109/77 | HR 67 | Ht 77.0 in | Wt 330.5 lb

## 2022-04-25 DIAGNOSIS — R413 Other amnesia: Secondary | ICD-10-CM | POA: Diagnosis not present

## 2022-04-25 DIAGNOSIS — R569 Unspecified convulsions: Secondary | ICD-10-CM | POA: Diagnosis not present

## 2022-04-25 DIAGNOSIS — Q039 Congenital hydrocephalus, unspecified: Secondary | ICD-10-CM | POA: Diagnosis not present

## 2022-04-25 MED ORDER — DONEPEZIL HCL 5 MG PO TABS: 5.0000 mg | ORAL_TABLET | Freq: Every day | ORAL | 11 refills | Status: DC

## 2022-04-25 NOTE — Telephone Encounter (Signed)
[employer] medicare sent to [facility] they obtain auth [phone] 

## 2022-04-25 NOTE — Progress Notes (Signed)
Patient: [name] Date of Birth: March 04, 1965  Reason for Visit: Follow up for seizures, memory History from: Patient, mother, father Primary Neurologist: Dr. April [name]  ASSESSMENT AND PLAN 57 y.o. year old male   1.  History of seizures -Continue Keppra 1000 mg twice daily -Reportedly his seizures were related to his past hydrocephalus, none since VP shunt  2.  Mild memory disturbance -MMSE 25/30, trouble with short-term memory, his 49 year old parents help him with day to day activities  -Continue Namenda 10 mg twice daily -Will add Aricept 5 mg nightly  -ATN profile to look for AD biomarker's -CT scan without contrast.   3.  Aqueductal stenosis, status post VP shunt -Stable, no issues  4.  Restless leg syndrome -To simplify medication regimen, try to reduce Mirapex 0.125 mg to once daily at bedtime   5.  Urinary frequency/urgency -They follow up with Urology now    HISTORY OF PRESENT ILLNESS: Today 04/25/22 Patient presents today for follow-up he is accompanied by father.  Since last visit a year ago, he has been doing well no seizure or seizure-like event.  He is compliant with the Keppra 1000 mg twice daily, denies any side effect from the medicine. In terms of the memory, he reported his memory is worse.  He is very forgetful, has to rely on parents every day. They do call him to remind him all the time.  He also does not drive, parents do take him places.  He is still independent, lives alone, pays his bills, denies making any mistakes when cooking and denies having issues with remembering family members names.  Parents do call him multiple times during the day. He is on Namenda 10 mg twice daily.  Denies any side effect from the medicine. He also has a history of depression since his wife died, he is on Venlafaxine.    INTERVAL HISTORY 05/05/2021: [name] here today for follow-up.  He is on Keppra, Mirapex, Namenda from this office. He lives alone with his dog.  Doesn't drive, his parents drive him, manage his medications with pill box. He walks to the store. No major health issues of recent. No falls. Has bilateral VP shunts, was last about 10 years ago, Dr. Joya [name] placed. Memory has declined, MMSE 25/30. Short term memory is poor. His parents have to call multiple times to remind him of appointments. Trying to walk for exercise, but has urinate frequently. Has lost 30 lbs since last year.   HISTORY  05/04/2020 Dr. Jannifer [name]: [name] is a 57 year old right-handed white male with a history of aqueductal stenosis with hydrocephalus, status post VP shunt.  He has some baseline memory problems, he has adrenal insufficiency and is being treated with Florinef and hydrocortisone..  The patient does have a history of seizures that have been well controlled on Keppra, he tolerates the drug fairly well.  The patient lost his wife within the last year, he now lives alone and is planning on moving into [place].  The patient comes into the office today with his father.  The father manages the medications.  The patient has prediabetes with a hemoglobin A1c of 6.2, he does have some problems with high blood pressure and restless leg syndrome.  He takes low-dose Mirapex for this.  He is on Namenda for his memory issues.  The patient does not operate a motor vehicle.  He returns for further evaluation.  REVIEW OF SYSTEMS: Out of a complete 14 system review of symptoms, the patient  complains only of the following symptoms, and all other reviewed systems are negative.  See HPI  ALLERGIES: Allergies  Allergen Reactions   Omnipaque [Iohexol] Hives   Prednisone Nausea And Vomiting and Other (See Comments)    unknown    HOME MEDICATIONS: Outpatient Medications Prior to Visit  Medication Sig Dispense Refill   amLODipine (NORVASC) 10 MG tablet Take 1 tablet (10 mg total) by mouth daily. 90 tablet 0   finasteride (PROSCAR) 5 MG tablet Take 5 mg by mouth daily.      fludrocortisone (FLORINEF) 0.1 MG tablet Take 0.2 mg by mouth 2 (two) times daily.     hydrocortisone (CORTEF) 10 MG tablet Take 10 mg by mouth daily.     levETIRAcetam (KEPPRA) 1000 MG tablet Take 1 tablet (1,000 mg total) by mouth 2 (two) times daily. 180 tablet 3   Melatonin 5 MG TABS Take by mouth at bedtime.     memantine (NAMENDA) 10 MG tablet Take 1 tablet (10 mg total) by mouth 2 (two) times daily. 180 tablet 3   pramipexole (MIRAPEX) 0.125 MG tablet Take 1 tablet (0.125 mg total) by mouth 2 (two) times daily. 180 tablet 3   Semaglutide 14 MG TABS Take by mouth.     SODIUM CHLORIDE PO Take 1,000 mg by mouth in the morning, at noon, and at bedtime.     solifenacin (VESICARE) 10 MG tablet Take 10 mg by mouth daily.     valsartan-hydrochlorothiazide (DIOVAN-HCT) 320-25 MG tablet Take 1 tablet by mouth daily. 90 tablet 1   venlafaxine XR (EFFEXOR-XR) 75 MG 24 hr capsule Take 3 capsules (225 mg total) by mouth daily. (Patient taking differently: Take 75 mg by mouth daily.) 270 capsule 1   No facility-administered medications prior to visit.    PAST MEDICAL HISTORY: Past Medical History:  Diagnosis Date   Anxiety    Aqueductal stenosis    Atrial fibrillation    Cardiomyopathy    Carpal tunnel syndrome    Chiari I malformation    Coronary artery disease    Depression    Headache(784.0)    Hydrocephalus    Hypertension    Memory loss    Obstructive sleep apnea    Pulmonary embolism    Renal calculi    Restless leg syndrome    RLS (restless legs syndrome) 05/04/2020   Salt-wasting syndrome of infancy    Seizure    SIADH (syndrome of inappropriate ADH production)    Syncope    Recurrent, thought to be neurally mediated    PAST SURGICAL HISTORY: Past Surgical History:  Procedure Laterality Date   CHOLECYSTECTOMY     LOOP RECORDER EXPLANT N/A 10/30/2012   Procedure: LOOP RECORDER EXPLANT;  Surgeon: Philip Sprang, MD;  Location: [facility] CATH LAB;  Service: Cardiovascular;   Laterality: N/A;   Nissen Fundoplasty     PROSTATE SURGERY     [employer] DM [phone] loop     S/p loop recorder   VENTRICULOPERITONEAL SHUNT Bilateral     FAMILY HISTORY: Family History  Problem Relation Age of Onset   Migraines Mother    CAD Mother    Hypertension Mother    Endometriosis Sister    Seizures Neg Hx     SOCIAL HISTORY: Social History   Socioeconomic History   Marital status: Widowed    Spouse name: [name]   Number of children: 0   Years of education: 12   Highest education level: Not on file  Occupational History  Occupation: Research scientist (physical sciences): [id number]    Comment: Not working   Tobacco Use   Smoking status: Never   Smokeless tobacco: Never  Vaping Use   Vaping Use: Never used  Substance and Sexual Activity   Alcohol use: No    Alcohol/week: 0.0 standard drinks of alcohol   Drug use: No   Sexual activity: Not Currently  Other Topics Concern   Not on file  Social History Narrative   Patient's wife passed away, no children   Patient is right handed   Education level is high school graduate   Drinks caffeine free drinks   Social Determinants of Health   Financial Resource Strain: Low Risk  (04/17/2022)   Overall Financial Resource Strain (CARDIA)    Difficulty of Paying Living Expenses: Not hard at all  Food Insecurity: No Food Insecurity (11/25/2021)   Hunger Vital Sign    Worried About Running Out of Food in the Last Year: Never true    Ran Out of Food in the Last Year: Never true  Transportation Needs: No Transportation Needs (11/25/2021)   PRAPARE - Hydrologist (Medical): No    Lack of Transportation (Non-Medical): No  Physical Activity: Sufficiently Active (11/25/2021)   Exercise Vital Sign    Days of Exercise per Week: 7 days    Minutes of Exercise per Session: 30 min  Stress: No Stress Concern Present (11/25/2021)   [place]     Feeling of Stress : Not at all  Social Connections: Not on file  Intimate Partner Violence: Not At Risk (04/17/2022)   Humiliation, Afraid, Rape, and Kick questionnaire    Fear of Current or Ex-Partner: No    Emotionally Abused: No    Physically Abused: No    Sexually Abused: No    PHYSICAL EXAM  Vitals:   04/25/22 1019  BP: 109/77  Pulse: 67  Weight: (!) 330 lb 8 oz (149.9 kg)  Height: 6\' 5"  (1.956 m)    Body mass index is 39.19 kg/m.    04/25/2022   10:22 AM 05/05/2021    9:59 AM 08/13/2019   10:22 AM  MMSE - Mini Mental State Exam  Orientation to time 5 3 5   Orientation to Place 5 5 5   Registration 3 3 3   Attention/ Calculation 4 4 5   Recall 2 1 1   Language- name 2 objects 2 2 2   Language- repeat 1 1 1   Language- follow 3 step command 3 3 1   Language- read & follow direction 1 1 1   Write a sentence 1 1 1   Copy design 1 1 1   Total score 28 25 26    Generalized: Well developed, in no acute distress  Neurological examination  Mentation: Alert oriented to time, place, history taking. Follows all commands speech and language fluent Cranial nerve II-XII: Pupils were equal round reactive to light. Extraocular movements were full, visual field were full on confrontational test. Facial sensation and strength were normal.  Head turning and shoulder shrug  were normal and symmetric. Motor: The motor testing reveals 5 over 5 strength of all 4 extremities. Good symmetric motor tone is noted throughout.  Sensory: Sensory testing is intact to soft touch on all 4 extremities. No evidence of extinction is noted.  Coordination: Cerebellar testing reveals good finger-nose-finger and heel-to-shin bilaterally.  Fine tremor bilaterally with finger-nose-finger worse with left hand  Gait and station: Gait is slightly wide-based,  but steady and independent, positive Romberg, unable to tandem Reflexes: Deep tendon reflexes are symmetric and normal bilaterally.   DIAGNOSTIC DATA (LABS, IMAGING,  TESTING) - I reviewed patient records, labs, notes, testing and imaging myself where available.  Lab Results  Component Value Date   WBC 7.9 04/17/2022   HGB 14.6 04/17/2022   HCT 45.2 04/17/2022   MCV 84 04/17/2022   PLT 359 04/17/2022      Component Value Date/Time   NA 141 04/17/2022 1036   K 5.1 04/17/2022 1036   CL 101 04/17/2022 1036   CO2 22 04/17/2022 1036   GLUCOSE 99 04/17/2022 1036   GLUCOSE 88 08/07/2011 0052   BUN 14 04/17/2022 1036   CREATININE 1.01 04/17/2022 1036   CALCIUM 9.7 04/17/2022 1036   PROT 6.8 04/17/2022 1036   ALBUMIN 4.0 04/17/2022 1036   AST 22 04/17/2022 1036   ALT 15 04/17/2022 1036   ALKPHOS 82 04/17/2022 1036   BILITOT 0.6 04/17/2022 1036   GFRNONAA 104 03/18/2020 1026   GFRAA 121 03/18/2020 1026   Lab Results  Component Value Date   CHOL 144 04/17/2022   HDL 41 04/17/2022   LDLCALC 85 04/17/2022   TRIG 95 04/17/2022   CHOLHDL 3.5 04/17/2022   Lab Results  Component Value Date   HGBA1C 6.0 (H) 04/17/2022   Lab Results  Component Value Date   VITAMINB12 640 01/05/2016   Lab Results  Component Value Date   TSH 3.590 04/17/2022   I have spent a total of 45 minutes dedicated to this patient today, preparing to see patient, performing a medically appropriate examination and evaluation, ordering tests and/or medications and procedures, and counseling and educating the patient/family/caregiver; independently interpreting result and communicating results to the family/patient/caregiver; and documenting clinical information in the electronic medical record.    Alric Ran, MD 04/25/2022, 10:33 AM [facility] [address], [place], [place] [address] [phone]

## 2022-04-28 LAB — ATN PROFILE
A -- Beta-amyloid 42/40 Ratio: 0.11 (ref >0.102)
Beta-amyloid 40: 148.48 pg/mL
Beta-amyloid 42: 16.39 pg/mL
N -- NfL, Plasma: 1.04 pg/mL (ref 0.00–3.78)
T -- p-tau181: 1.04 pg/mL — ABNORMAL HIGH (ref 0.00–0.97)

## 2022-04-29 NOTE — Progress Notes (Signed)
[name]  Your recent labs we checked were within normal limits. There are no findings consistent with Alzheimer dementia. No further action is required on these tests at this time. Please keep any upcoming appointments or tests and call us with any interim questions, concerns, problems or updates. Thanks,   Windell Norfolk, MD

## 2022-05-01 ENCOUNTER — Telehealth: Payer: Self-pay

## 2022-05-01 NOTE — Telephone Encounter (Signed)
Patient called.  Unable to reach patient. On [phone] constant ringing

## 2022-05-01 NOTE — Telephone Encounter (Signed)
-----   Message from Windell Norfolk, MD sent at 04/29/2022  1:30 PM EDT ----- [name]  Your recent labs we checked were within normal limits. There are no findings consistent with Alzheimer dementia. No further action is required on these tests at this time. Please keep any upcoming appointments or tests and call us with any interim questions, concerns, problems or updates. Thanks,   Windell Norfolk, MD

## 2022-05-09 ENCOUNTER — Ambulatory Visit: Payer: Medicare HMO | Admitting: Neurology

## 2022-06-23 ENCOUNTER — Other Ambulatory Visit: Payer: Self-pay | Admitting: Family Medicine

## 2022-06-23 DIAGNOSIS — I10 Essential (primary) hypertension: Secondary | ICD-10-CM

## 2022-06-24 ENCOUNTER — Other Ambulatory Visit: Payer: Self-pay | Admitting: Neurology

## 2022-06-24 ENCOUNTER — Other Ambulatory Visit: Payer: Self-pay | Admitting: Family Medicine

## 2022-06-24 DIAGNOSIS — I10 Essential (primary) hypertension: Secondary | ICD-10-CM

## 2022-06-27 DIAGNOSIS — R7303 Prediabetes: Secondary | ICD-10-CM | POA: Diagnosis not present

## 2022-06-27 DIAGNOSIS — E2740 Unspecified adrenocortical insufficiency: Secondary | ICD-10-CM | POA: Diagnosis not present

## 2022-06-27 DIAGNOSIS — E871 Hypo-osmolality and hyponatremia: Secondary | ICD-10-CM | POA: Diagnosis not present

## 2022-06-29 ENCOUNTER — Telehealth: Payer: Self-pay

## 2022-06-29 NOTE — Telephone Encounter (Cosign Needed)
Yes mam we will check, thank  you for letting us know.   Billee Cashing, CMA Clinical Pharmacist Assistant [phone]'

## 2022-06-29 NOTE — Telephone Encounter (Signed)
Patient's Father Jake Shark called stating that he has never heard back to see if patient is approved for Rybelsus for patient assistance. He states it was sent back in December but never heard anything back or received a letter. Could you check into this for me? Patient has 2 bottles left of the medication.   Jake Shark Laffey [phone]

## 2022-06-30 ENCOUNTER — Telehealth: Payer: Self-pay

## 2022-06-30 NOTE — Progress Notes (Cosign Needed)
06/30/22- [employer] and spoke with Judeth Cornfield and pt is approved for Rybelsus 14mg  until 01/23/23. Last shipment was 06/24/22 120ds. Should arrive to office from 10-14 business days.   Roxana Hires, CMA Clinical Pharmacist Assistant  [phone]

## 2022-07-03 ENCOUNTER — Telehealth: Payer: Self-pay

## 2022-07-03 NOTE — Progress Notes (Cosign Needed)
Called patient's father Tishawn Friedhoff to inform that patient Rybelsus has been shipped and should arrive to the office within the next week or 2. No answer, no voicemail,will retry.  Patients father returned my call he is aware medication has been shipped and should arrive to the office soon. When father picks up medications if 3 to 4 bottles are not supplied the office will let me know and will contact [employer] again regarding order day supply status.   Billee Cashing, CMA Clinical Pharmacist Assistant [phone]

## 2022-07-06 ENCOUNTER — Telehealth: Payer: Self-pay

## 2022-07-06 NOTE — Patient Outreach (Signed)
  Care Coordination   Initial Visit Note   07/06/2022 Name: [name] MRN: [id number] DOB: Apr 10, 1965  [name] is a 57 y.o. year old male who sees Cox, Kirsten, MD for primary care. I spoke with  [name] by phone today.  What matters to the patients health and wellness today?  Placed call to patient and spoke with father, [name], who is health care power of attorney.   [name] states that patient is doing well and denies any unmet needs.     SDOH assessments and interventions completed:  No     Care Coordination Interventions:  No, not indicated   Follow up plan: No further intervention required.   Encounter Outcome:  Pt. Refused   Rowe Pavy, RN, BSN, CEN [facility] [employer] [phone]

## 2022-07-08 ENCOUNTER — Other Ambulatory Visit: Payer: Self-pay | Admitting: Neurology

## 2022-07-24 ENCOUNTER — Telehealth: Payer: Self-pay

## 2022-07-24 NOTE — Telephone Encounter (Signed)
Rybelsus (Qty4) picked up by Lonia Mad, patient's father.

## 2022-07-24 NOTE — Telephone Encounter (Signed)
Father aware that patient assistance for Rybelsus 14 mg (4 bx of #30) is here for pickup PO# [phone]

## 2022-08-05 ENCOUNTER — Other Ambulatory Visit: Payer: Self-pay | Admitting: Neurology

## 2022-09-12 ENCOUNTER — Telehealth: Payer: Self-pay

## 2022-09-12 NOTE — Telephone Encounter (Signed)
Refill form faxed to [employer] per their request for Rybelsus patient assistance

## 2022-10-17 ENCOUNTER — Other Ambulatory Visit: Payer: Self-pay

## 2022-10-17 NOTE — Telephone Encounter (Addendum)
Left message informing patient, Rybelsus is vailable for pick up from his patient assistance. P O number [id number] 4 bottles of #30 14 mg Rybelsus.

## 2022-10-18 NOTE — Assessment & Plan Note (Addendum)
Well controlled.  Continue with valsartan 320 mg/25 mg once daily, amlodipine 10 mg daily. Continue to work on eating a healthy diet and exercise.  Labs drawn today.

## 2022-10-19 ENCOUNTER — Encounter: Payer: Self-pay | Admitting: Family Medicine

## 2022-10-19 ENCOUNTER — Ambulatory Visit (INDEPENDENT_AMBULATORY_CARE_PROVIDER_SITE_OTHER): Payer: Medicare HMO | Admitting: Family Medicine

## 2022-10-19 VITALS — BP 112/70 | HR 72 | Temp 97.0°F | Resp 18 | Ht 76.0 in | Wt 340.0 lb

## 2022-10-19 DIAGNOSIS — F0280 Dementia in other diseases classified elsewhere without behavioral disturbance: Secondary | ICD-10-CM

## 2022-10-19 DIAGNOSIS — Q039 Congenital hydrocephalus, unspecified: Secondary | ICD-10-CM | POA: Diagnosis not present

## 2022-10-19 DIAGNOSIS — Z23 Encounter for immunization: Secondary | ICD-10-CM

## 2022-10-19 DIAGNOSIS — R413 Other amnesia: Secondary | ICD-10-CM

## 2022-10-19 DIAGNOSIS — R7303 Prediabetes: Secondary | ICD-10-CM | POA: Diagnosis not present

## 2022-10-19 DIAGNOSIS — I10 Essential (primary) hypertension: Secondary | ICD-10-CM

## 2022-10-19 DIAGNOSIS — F330 Major depressive disorder, recurrent, mild: Secondary | ICD-10-CM | POA: Diagnosis not present

## 2022-10-19 MED ORDER — DONEPEZIL HCL 10 MG PO TABS
10.0000 mg | ORAL_TABLET | Freq: Every day | ORAL | 1 refills | Status: DC
Start: 2022-10-19 — End: 2023-04-19

## 2022-10-19 NOTE — Patient Instructions (Signed)
Increase donepezil to 10 mg before bed.  Call neurology about worsening memory.

## 2022-10-20 LAB — COMPREHENSIVE METABOLIC PANEL
ALT: 22 [IU]/L (ref 0–44)
AST: 31 [IU]/L (ref 0–40)
Albumin: 3.8 g/dL (ref 3.8–4.9)
Alkaline Phosphatase: 77 [IU]/L (ref 44–121)
BUN/Creatinine Ratio: 14 (ref 9–20)
BUN: 12 mg/dL (ref 6–24)
Bilirubin Total: 0.8 mg/dL (ref 0.0–1.2)
CO2: 23 mmol/L (ref 20–29)
Calcium: 9.3 mg/dL (ref 8.7–10.2)
Chloride: 94 mmol/L — ABNORMAL LOW (ref 96–106)
Creatinine, Ser: 0.83 mg/dL (ref 0.76–1.27)
Globulin, Total: 2.9 g/dL (ref 1.5–4.5)
Glucose: 85 mg/dL (ref 70–99)
Potassium: 4.3 mmol/L (ref 3.5–5.2)
Sodium: 133 mmol/L — ABNORMAL LOW (ref 134–144)
Total Protein: 6.7 g/dL (ref 6.0–8.5)
eGFR: 102 mL/min/{1.73_m2} (ref >59)

## 2022-10-20 LAB — CBC WITH DIFFERENTIAL/PLATELET
Basophils Absolute: 0.1 10*3/uL (ref 0.0–0.2)
Basos: 1 %
EOS (ABSOLUTE): 0.1 10*3/uL (ref 0.0–0.4)
Eos: 2 %
Hematocrit: 43.4 % (ref 37.5–51.0)
Hemoglobin: 14.6 g/dL (ref 13.0–17.7)
Immature Grans (Abs): 0.1 10*3/uL (ref 0.0–0.1)
Immature Granulocytes: 1 %
Lymphocytes Absolute: 2.5 10*3/uL (ref 0.7–3.1)
Lymphs: 27 %
MCH: 28.9 pg (ref 26.6–33.0)
MCHC: 33.6 g/dL (ref 31.5–35.7)
MCV: 86 fL (ref 79–97)
Monocytes Absolute: 1 10*3/uL — ABNORMAL HIGH (ref 0.1–0.9)
Monocytes: 11 %
Neutrophils Absolute: 5.4 10*3/uL (ref 1.4–7.0)
Neutrophils: 58 %
Platelets: 405 10*3/uL (ref 150–450)
RBC: 5.06 x10E6/uL (ref 4.14–5.80)
RDW: 12.7 % (ref 11.6–15.4)
WBC: 9.1 10*3/uL (ref 3.4–10.8)

## 2022-10-20 LAB — HEMOGLOBIN A1C
Est. average glucose Bld gHb Est-mCnc: 128 mg/dL
Hgb A1c MFr Bld: 6.1 % — ABNORMAL HIGH (ref 4.8–5.6)

## 2022-10-20 LAB — B12 AND FOLATE PANEL
Folate: 6.4 ng/mL (ref >3.0)
Vitamin B-12: 442 pg/mL (ref 232–1245)

## 2022-10-20 LAB — TSH: TSH: 2.84 u[IU]/mL (ref 0.450–4.500)

## 2022-10-21 NOTE — Assessment & Plan Note (Signed)
Increase donepezil to 10 mg before bed.

## 2022-10-21 NOTE — Assessment & Plan Note (Signed)
The current medical regimen is effective;  continue present plan and medications.  

## 2022-10-21 NOTE — Assessment & Plan Note (Signed)
Hemoglobin A1c 6.0%, 3 month avg of blood sugars, is in prediabetic range.  In order to prevent progression to diabetes, recommend low carb diet and regular exercise 

## 2022-10-22 ENCOUNTER — Encounter: Payer: Self-pay | Admitting: Family Medicine

## 2022-10-22 NOTE — Assessment & Plan Note (Signed)
Management per specialist. 

## 2022-10-23 LAB — METHYLMALONIC ACID, SERUM: Methylmalonic Acid: 204 nmol/L (ref 0–378)

## 2022-11-11 ENCOUNTER — Other Ambulatory Visit: Payer: Self-pay | Admitting: Neurology

## 2022-11-16 ENCOUNTER — Encounter: Payer: Medicare HMO | Attending: Psychology | Admitting: Psychology

## 2022-11-16 ENCOUNTER — Encounter: Payer: Self-pay | Admitting: Psychology

## 2022-11-16 DIAGNOSIS — F330 Major depressive disorder, recurrent, mild: Secondary | ICD-10-CM

## 2022-11-16 DIAGNOSIS — R269 Unspecified abnormalities of gait and mobility: Secondary | ICD-10-CM

## 2022-11-16 DIAGNOSIS — R413 Other amnesia: Secondary | ICD-10-CM

## 2022-11-16 DIAGNOSIS — R569 Unspecified convulsions: Secondary | ICD-10-CM

## 2022-11-16 DIAGNOSIS — Q039 Congenital hydrocephalus, unspecified: Secondary | ICD-10-CM
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
## 2022-11-21 ENCOUNTER — Ambulatory Visit: Payer: Medicare HMO

## 2022-11-23 DIAGNOSIS — R569 Unspecified convulsions: Secondary | ICD-10-CM | POA: Diagnosis not present

## 2022-11-23 DIAGNOSIS — R269 Unspecified abnormalities of gait and mobility: Secondary | ICD-10-CM

## 2022-11-23 DIAGNOSIS — R413 Other amnesia: Secondary | ICD-10-CM

## 2022-11-23 DIAGNOSIS — Q039 Congenital hydrocephalus, unspecified: Secondary | ICD-10-CM | POA: Diagnosis not present

## 2022-11-23 DIAGNOSIS — F330 Major depressive disorder, recurrent, mild: Secondary | ICD-10-CM | POA: Diagnosis not present

## 2022-11-28 ENCOUNTER — Encounter: Payer: Medicare HMO | Attending: Psychology

## 2022-11-28 DIAGNOSIS — R269 Unspecified abnormalities of gait and mobility: Secondary | ICD-10-CM | POA: Diagnosis not present

## 2022-11-28 DIAGNOSIS — R569 Unspecified convulsions: Secondary | ICD-10-CM | POA: Diagnosis not present

## 2022-11-28 DIAGNOSIS — R413 Other amnesia: Secondary | ICD-10-CM

## 2022-11-28 DIAGNOSIS — Q039 Congenital hydrocephalus, unspecified: Secondary | ICD-10-CM

## 2022-11-28 DIAGNOSIS — F330 Major depressive disorder, recurrent, mild: Secondary | ICD-10-CM | POA: Diagnosis not present

## 2022-11-28 NOTE — Progress Notes (Unsigned)
   Neuropsychology Note  [name] completed 80 minutes of neuropsychological testing with technician, Staci Acosta, BA, under the supervision of Arley Phenix, PsyD., Clinical Neuropsychologist. The patient did not appear overtly distressed by the testing session, per behavioral observation or via self-report to the technician. Rest breaks were offered.   Clinical Decision Making: In considering the patient's current level of functioning, level of presumed impairment, nature of symptoms, emotional and behavioral responses during clinical interview, level of literacy, and observed level of motivation/effort, a battery of tests was selected by Dr. Kieth Brightly during initial consultation on 11/16/2022. This was communicated to the technician. Communication between the neuropsychologist and technician was ongoing throughout the testing session and changes were made as deemed necessary based on patient performance on testing, technician observations and additional pertinent factors such as those listed above.  Tests Administered: Comprehensive Attention Battery (CAB) Continuous Performance Test (CPT)  Results: Will be included in final report   Feedback to Patient: [name] will return on 08/23/2023 for an interactive feedback session with Dr. Kieth Brightly at which time his test performances, clinical impressions and treatment recommendations will be reviewed in detail. The patient understands he can contact our office should he require our assistance before this time.  80 minutes spent face-to-face with patient administering standardized tests, 30 minutes spent scoring Radiographer, therapeutic). [CPT [id number], [id number]]  Full report to follow.

## 2022-11-28 NOTE — Progress Notes (Unsigned)
Behavioral Observations:  The patient appeared well-groomed and appropriately dressed. His manners were polite and appropriate to the situation. The patient's attitude towards testing was positive and his effort was good.   Neuropsychology Note  [name] completed 120 minutes of neuropsychological testing with technician, [name], BA, under the supervision of Philip Phenix, PsyD., Clinical Neuropsychologist. The patient did not appear overtly distressed by the testing session, per behavioral observation or via self-report to the technician. Rest breaks were offered.   Clinical Decision Making: In considering the patient's current level of functioning, level of presumed impairment, nature of symptoms, emotional and behavioral responses during clinical interview, level of literacy, and observed level of motivation/effort, a battery of tests was selected by Dr. Kieth [name] during initial consultation on 11/16/2022. This was communicated to the technician. Communication between the neuropsychologist and technician was ongoing throughout the testing session and changes were made as deemed necessary based on patient performance on testing, technician observations and additional pertinent factors such as those listed above.  Tests Administered: Finger Tapping Test (FTT) Grooved Pegboard Wechsler Adult Intelligence Scale, 4th Edition (WAIS-IV) Wechsler Memory Scale, 4th Edition (WMS-IV); Adult Battery   Results:  FTT:  R [facility]) Average= 46.6 Percentile Rank=46th L (NDH) Average= 37.8 Percentile Rank= 34th  Grooved Pegboard:  R (DH) time= 177s Percentile Rank=18th L (NDH) time= 180s  Percentile Rank= 20th *A noticeable tremor was present on L hand trial.   WAIS-IV:   Composite Score Summary  Scale Sum of Scaled Scores Composite Score Percentile Rank 95% Conf. Interval Qualitative Description  Verbal Comprehension 30 VCI 100 50 94-106 Average  Perceptual Reasoning 22  PRI 84 14 79-91 Low Average  Working Memory 11 WMI 74 4 69-82 Borderline  Processing Speed 9 PSI 71 3 66-82 Borderline  Full Scale 72 FSIQ 80 9 76-84 Low Average  General Ability 52 GAI 92 30 87-97 Average   Verbal Comprehension Subtests Summary  Subtest Raw Score Scaled Score Percentile Rank Reference Group Scaled Score SEM  Similarities 23 9 37 9 1.08  Vocabulary 43 11 63 12 0.73  Information 14 10 50 11 0.67  The scaled scores in the Reference Group Scaled Score column are based on the performance of examinees aged 20:0-34:11 (i.e., the reference group). See Chapter 6 of the WAIS-IV Technical and Interpretive Manual for more information.  Perceptual Reasoning Subtests Summary  Subtest Raw Score Scaled Score Percentile Rank Reference Group Scaled Score SEM  Block Design 28 8 25 6  1.04  Matrix Reasoning 10 7 16 5  0.95  Visual Puzzles 9 7 16 6  0.99   Working Librarian, academic Raw Score Scaled Score Percentile Rank Reference Group Scaled Score SEM  Digit Span 16 5 5 4  0.85  Arithmetic 9 6 9 6  1.04   Processing Speed Subtests Summary  Subtest Raw Score Scaled Score Percentile Rank Reference Group Scaled Score SEM  Symbol Search 17 5 5 4  1.31  Coding 29 4 2 3  0.99    WMS-IV:   Index Score Summary  Index Sum of Scaled Scores Index Score Percentile Rank 95% Confidence Interval Qualitative Descriptor  Auditory Memory (AMI) 13 58 0.3 54-66 Extremely Low  Visual Memory (VMI) 23 74 4 69-81 Borderline  Visual Working Memory (VWMI) 13 80 9 74-89 Low Average  Immediate Memory (IMI) 24 73 4 68-81 Borderline  Delayed Memory (DMI) 12 53 0.1 49-63 Extremely Low    Primary Subtest Scaled Score Summary  Subtest Domain Raw Score Scaled Score  Percentile Rank  Logical Memory I AM 14 5 5   Logical Memory II AM 5 3 1   Verbal Paired Associates I AM 9 4 2   Verbal Paired Associates II AM 0 1 0.1  Designs I VM 56 8 25  Designs II VM 39 7 16  Visual Reproduction I VM 27 7 16    Visual Reproduction II VM 0 1 0.1  Spatial Addition VWM 7 7 16   Symbol Span VWM 12 6 9     Auditory Memory Process Score Summary  Process Score Raw Score Scaled Score Percentile Rank Cumulative Percentage (Base Rate)  LM II Recognition 25 - - 51-75%  VPA II Recognition 28 - - <=2%   Visual Memory Process Score Summary  Process Score Raw Score Scaled Score Percentile Rank Cumulative Percentage (Base Rate)  DE I Content 28 7 16  -  DE I Spatial 16 10 50 -  DE II Content 27 7 16  -  DE II Spatial 10 9 37 -  DE II Recognition 11 - - 10-16%  VR II Recognition 1 - - <=2%   ABILITY-MEMORY ANALYSIS  Ability Score:  VCI: 100 Date of Testing:  WAIS-IV; WMS-IV 2022/11/23  Predicted Difference Method   Index Predicted WMS-IV Index Score Actual WMS-IV Index Score Difference Critical Value  Significant Difference Y/N Base Rate  Auditory Memory 100 58 42 9.00 Y <1%  Visual Memory 100 74 26 8.38 Y 3%  Visual Working Memory 100 80 20 10.86 Y 5-10%  Immediate Memory 100 73 27 10.12 Y 1-2%  Delayed Memory 100 53 47 9.95 Y <1%  Statistical significance (critical value) at the .01 level.    Feedback to Patient: [name] will return on 08/23/2023 for an interactive feedback session with Dr. Kieth [name] at which time his test performances, clinical impressions and treatment recommendations will be reviewed in detail. The patient understands he can contact our office should he require our assistance before this time.  120 minutes spent face-to-face with patient administering standardized tests, 30 minutes spent scoring Radiographer, therapeutic). [CPT [id number], [id number]]  Full report to follow.

## 2022-12-07 ENCOUNTER — Telehealth: Payer: Self-pay | Admitting: Family Medicine

## 2022-12-07 NOTE — Telephone Encounter (Signed)
Patient father called in about a assistance e program he is on and wants to know if thee is anything he needs to do to have it renewed

## 2022-12-12 ENCOUNTER — Telehealth: Payer: Self-pay

## 2022-12-12 ENCOUNTER — Other Ambulatory Visit: Payer: Self-pay | Admitting: Pharmacist

## 2022-12-12 NOTE — Progress Notes (Unsigned)
Care Coordination Call  Contacted patient's father, [name]. Due to re-enroll [name] in Rybelsus assistance for 2025. Will collaborate with PCP for re-enrollment through online process.   Catie Eppie Gibson, PharmD, BCACP, CPP Clinical Pharmacist [facility] [phone]

## 2022-12-12 NOTE — Telephone Encounter (Signed)
Patients father Shaft Tia came in the office today with questions about [name]'s patient assistance. Jake Shark was notified that we are short staffed today and he was willing to wait. Harold waited 20 minutes before having to leave due to his wife. Can someone please call Jake Shark in regard to the questions he had about his Pt assistance. Jake Shark stated that he has called several times but he has not received a response.

## 2022-12-12 NOTE — Telephone Encounter (Signed)
Spoke with patient's father. We can complete online re-enrollment application through [employer]. I can either have the provider portion emailed to Dr. Cox/someone at the office for her electronic signature or I can complete it on her behalf, whichever is preferred. Let me know!  Catie Eppie Gibson, PharmD, BCACP, CPP Clinical Pharmacist [facility] [phone]

## 2022-12-13 NOTE — Progress Notes (Signed)
Pharmacy Medication Assistance Program Note    12/13/2022  Patient ID: [name], male   DOB: 1965-12-29, 57 y.o.   MRN: [id number]     12/13/2022  Outreach Medication One  Initial Outreach Date (Medication One) 12/13/2022  Manufacturer Medication One [employer] [facility] Rybelsus  Dose of Rybelsus 14 mg  Type of Radiographer, therapeutic Assistance  Date Application Sent to Patient 12/13/2022  Application Items Requested Application  Date Application Sent to Prescriber 12/13/2022  Name of Prescriber Windell Moment  Date Application Received From Patient 12/13/2022  Application Items Received From Patient Application  Date Application Received From Provider 12/13/2022  Date Application Submitted to Manufacturer 12/13/2022  Method Application Sent to Manufacturer Online    Catie Eppie Gibson, PharmD, BCACP, CPP Clinical Pharmacist [facility] [phone]

## 2022-12-24 ENCOUNTER — Other Ambulatory Visit: Payer: Self-pay | Admitting: Neurology

## 2022-12-24 ENCOUNTER — Other Ambulatory Visit: Payer: Self-pay | Admitting: Family Medicine

## 2022-12-24 DIAGNOSIS — I10 Essential (primary) hypertension: Secondary | ICD-10-CM

## 2022-12-28 ENCOUNTER — Telehealth: Payer: Self-pay

## 2022-12-28 NOTE — Telephone Encounter (Signed)
PAP: Application for Rybelsus has been submitted to PAP Companies: [employer], online  Pt. Portion submitted by Catie resubmitted provider portion 12/5

## 2022-12-28 NOTE — Progress Notes (Addendum)
Care Coordination Call  Received a call from patient's father, Jake Shark. He notes they received a letter that the patient's application was unable to be processed. [employer], updated needed to prescription portion. Completed and faxed back to [employer].   Catie Eppie Gibson, PharmD, BCACP, CPP Clinical Pharmacist [facility] [phone]

## 2023-01-01 ENCOUNTER — Ambulatory Visit (INDEPENDENT_AMBULATORY_CARE_PROVIDER_SITE_OTHER): Payer: Medicare HMO | Admitting: Family Medicine

## 2023-01-01 ENCOUNTER — Encounter: Payer: Self-pay | Admitting: Family Medicine

## 2023-01-01 VITALS — BP 112/68 | HR 81 | Resp 16 | Ht 76.0 in | Wt 338.0 lb

## 2023-01-01 DIAGNOSIS — R300 Dysuria: Secondary | ICD-10-CM | POA: Diagnosis not present

## 2023-01-01 LAB — POCT URINALYSIS DIP (CLINITEK)
Bilirubin, UA: NEGATIVE
Blood, UA: NEGATIVE
Glucose, UA: NEGATIVE mg/dL
Ketones, POC UA: NEGATIVE mg/dL
Leukocytes, UA: NEGATIVE
Nitrite, UA: NEGATIVE
POC PROTEIN,UA: NEGATIVE
Spec Grav, UA: 1.025 (ref 1.010–1.025)
Urobilinogen, UA: 0.2 U/dL
pH, UA: 6 (ref 5.0–8.0)

## 2023-01-01 NOTE — Progress Notes (Signed)
Acute Office Visit  Subjective:    Patient ID: [name], male    DOB: 1966/01/03, 57 y.o.   MRN: [id number]  Chief Complaint  Patient presents with   Urinary Tract Infection    Discussed the use of AI scribe software for clinical note transcription with the patient, who gave verbal consent to proceed.   HPI: Symptoms have been going on for three days.The patient, with a history of urinary frequency and difficulty holding urine, presents with a recent episode of dysuria and hematuria. The symptoms started with a burning sensation during urination, which was followed by the presence of blood in the urine the next day. The patient reports that these symptoms have since resolved and the urine appears normal. The patient denies any recent injury, changes in soaps, lotions, or laundry detergents, and any new sexual partners or use of toys. The patient has a history of a kidney injury from many years ago, but denies any recent similar incidents. The patient has been on finasteride for urinary frequency for about a year.  Past Medical History:  Diagnosis Date   Anxiety    Aqueductal stenosis (HCC)    Atrial fibrillation (HCC)    Cardiomyopathy    Carpal tunnel syndrome    Chiari I malformation (HCC)    Coronary artery disease    Depression    Headache(784.0)    Hydrocephalus (HCC)    Hypertension    Memory loss    Obstructive sleep apnea    Pulmonary embolism (HCC)    Renal calculi    Restless leg syndrome    RLS (restless legs syndrome) 05/04/2020   Salt-wasting syndrome of infancy    Seizure (HCC)    SIADH (syndrome of inappropriate ADH production) (HCC)    Syncope    Recurrent, thought to be neurally mediated    Past Surgical History:  Procedure Laterality Date   CHOLECYSTECTOMY     LOOP RECORDER EXPLANT N/A 10/30/2012   Procedure: LOOP RECORDER EXPLANT;  Surgeon: Duke Salvia, MD;  Location[facility] C[facility] LAB;  Service: Cardiovascular;  Laterality: N/A;   Nissen  Fundoplasty     PROSTATE SURGERY     [employer] Jude DM [phone] loop     S/p loop recorder   VENTRICULOPERITONEAL SHUNT Bilateral     Family History  Problem Relation Age of Onset   Migraines Mother    CAD Mother    Hypertension Mother    Endometriosis Sister    Seizures Neg Hx     Social History   Socioeconomic History   Marital status: Widowed    Spouse name: Chipper Oman   Number of children: 0   Years of education: 12   Highest education level: Not on file  Occupational History   Occupation: CUSTOMER SERVICE    Employer: [id number]    Comment: Not working   Tobacco Use   Smoking status: Never   Smokeless tobacco: Never  Vaping Use   Vaping status: Never Used  Substance and Sexual Activity   Alcohol use: No    Alcohol/week: 0.0 standard drinks of alcohol   Drug use: No   Sexual activity: Not Currently  Other Topics Concern   Not on file  Social History Narrative   Patient's wife passed away, no children   Patient is right handed   Education level is high school graduate   Drinks caffeine free drinks   Social Determinants of Health   Financial Resource Strain: Low Risk  (04/17/2022)  Overall Financial Resource Strain (CARDIA)    Difficulty of Paying Living Expenses: Not hard at all  Food Insecurity: No Food Insecurity (11/25/2021)   Hunger Vital Sign    Worried About Running Out of Food in the Last Year: Never true    Ran Out of Food in the Last Year: Never true  Transportation Needs: No Transportation Needs (11/25/2021)   PRAPARE - Administrator, Civil Service (Medical): No    Lack of Transportation (Non-Medical): No  Physical Activity: Sufficiently Active (11/25/2021)   Exercise Vital Sign    Days of Exercise per Week: 7 days    Minutes of Exercise per Session: 30 min  Stress: No Stress Concern Present (11/25/2021)   [employer] of Occupational Health - Occupational Stress Questionnaire    Feeling of Stress : Not at all  Social Connections: Not on  file  Intimate Partner Violence: Not At Risk (04/17/2022)   Humiliation, Afraid, Rape, and Kick questionnaire    Fear of Current or Ex-Partner: No    Emotionally Abused: No    Physically Abused: No    Sexually Abused: No    Outpatient Medications Prior to Visit  Medication Sig Dispense Refill   amLODipine (NORVASC) 10 MG tablet Take 1 tablet by mouth once daily 90 tablet 1   donepezil (ARICEPT) 10 MG tablet Take 1 tablet (10 mg total) by mouth at bedtime. 90 tablet 1   finasteride (PROSCAR) 5 MG tablet Take 5 mg by mouth daily.     fludrocortisone (FLORINEF) 0.1 MG tablet Take 0.2 mg by mouth 2 (two) times daily.     hydrocortisone (CORTEF) 10 MG tablet Take 10 mg by mouth daily.     levETIRAcetam (KEPPRA) 1000 MG tablet Take 1 tablet by mouth twice daily 180 tablet 0   Melatonin 5 MG TABS Take by mouth at bedtime.     memantine (NAMENDA) 10 MG tablet Take 1 tablet by mouth twice daily 180 tablet 0   pramipexole (MIRAPEX) 0.125 MG tablet Take 1 tablet by mouth twice daily 180 tablet 0   Semaglutide 14 MG TABS Take by mouth.     SODIUM CHLORIDE PO Take 1,000 mg by mouth in the morning, at noon, and at bedtime.     solifenacin (VESICARE) 10 MG tablet Take 10 mg by mouth daily.     valsartan-hydrochlorothiazide (DIOVAN-HCT) 320-25 MG tablet Take 1 tablet by mouth once daily 90 tablet 1   venlafaxine XR (EFFEXOR-XR) 75 MG 24 hr capsule Take 3 capsules (225 mg total) by mouth daily. (Patient taking differently: Take 75 mg by mouth daily.) 270 capsule 1   No facility-administered medications prior to visit.    Allergies  Allergen Reactions   Omnipaque [Iohexol] Hives   Prednisone Nausea And Vomiting and Other (See Comments)    unknown    Review of Systems  Constitutional:  Negative for chills, diaphoresis, fatigue and fever.  HENT:  Negative for congestion, ear pain and sinus pain.   Respiratory:  Negative for cough and shortness of breath.   Cardiovascular:  Negative for chest pain.   Gastrointestinal:  Negative for abdominal pain, constipation, nausea and vomiting.  Genitourinary:  Positive for dysuria, frequency and hematuria.  Musculoskeletal:  Negative for arthralgias.  Neurological:  Negative for weakness and headaches.  Psychiatric/Behavioral:  Negative for dysphoric mood. The patient is not nervous/anxious.        Objective:        01/01/2023    3:08 PM 10/19/2022  9:15 AM 04/25/2022   10:19 AM  Vitals with BMI  Height 6\' 4"  6\' 4"  6\' 5"   Weight 338 lbs 340 lbs 330 lbs 8 oz  BMI 41.16 41.4 39.18  Systolic 112 112 161  Diastolic 68 70 77  Pulse 81 72 67    No data found.   Physical Exam Vitals reviewed.  Constitutional:      General: He is not in acute distress.    Appearance: He is obese. He is not ill-appearing.  Eyes:     Conjunctiva/sclera: Conjunctivae normal.  Cardiovascular:     Rate and Rhythm: Normal rate and regular rhythm.     Heart sounds: Normal heart sounds.  Pulmonary:     Effort: Pulmonary effort is normal.     Breath sounds: Normal breath sounds. No wheezing.  Abdominal:     Palpations: Abdomen is soft.     Tenderness: There is no abdominal tenderness.  Musculoskeletal:     Cervical back: Normal range of motion.  Skin:    General: Skin is warm.  Neurological:     Mental Status: He is alert. Mental status is at baseline.  Psychiatric:        Mood and Affect: Mood normal.        Behavior: Behavior normal.     Health Maintenance Due  Topic Date Due   Zoster Vaccines- Shingrix (2 of 2) 11/23/2021    There are no preventive care reminders to display for this patient.   Lab Results  Component Value Date   TSH 2.840 10/19/2022   Lab Results  Component Value Date   WBC 9.1 10/19/2022   HGB 14.6 10/19/2022   HCT 43.4 10/19/2022   MCV 86 10/19/2022   PLT 405 10/19/2022   Lab Results  Component Value Date   NA 133 (L) 10/19/2022   K 4.3 10/19/2022   CO2 23 10/19/2022   GLUCOSE 85 10/19/2022   BUN 12  10/19/2022   CREATININE 0.83 10/19/2022   BILITOT 0.8 10/19/2022   ALKPHOS 77 10/19/2022   AST 31 10/19/2022   ALT 22 10/19/2022   PROT 6.7 10/19/2022   ALBUMIN 3.8 10/19/2022   CALCIUM 9.3 10/19/2022   EGFR 102 10/19/2022   Lab Results  Component Value Date   CHOL 144 04/17/2022   Lab Results  Component Value Date   HDL 41 04/17/2022   Lab Results  Component Value Date   LDLCALC 85 04/17/2022   Lab Results  Component Value Date   TRIG 95 04/17/2022   Lab Results  Component Value Date   CHOLHDL 3.5 04/17/2022   Lab Results  Component Value Date   HGBA1C 6.1 (H) 10/19/2022   Urine dipstick shows negative for all components.      Assessment & Plan:  Burning with urination Assessment & Plan: POC urine - negative UTI -Take OTC AZO medication to help with symptoms as needed -Drink plenty of fluids -Urine culture sent Call if the burning worsens, you develop a fever, back pain or pelvic pain or if your symptom do not resolve after completing the antibiotic.Remember to wipe from front to back and don't hold it in! If possible, empty your bladder every 4 hours.   Orders: -     POCT URINALYSIS DIP (CLINITEK) -     Urine Culture     No orders of the defined types were placed in this encounter.   Orders Placed This Encounter  Procedures   Urine Culture   POCT URINALYSIS  DIP (CLINITEK)     Follow-up: Return if symptoms worsen or fail to improve.  An After Visit Summary was printed and given to the patient.  Total time spent on today's visit was 34 minutes, including both face-to-face time and nonface-to-face time personally spent on review of chart (labs and imaging), discussing labs and goals, discussing further work-up, treatment options, referrals to specialist if needed, reviewing outside records if pertinent, answering patient's questions, and coordinating care.     Lajuana Matte, FNP [facility] [phone]

## 2023-01-01 NOTE — Assessment & Plan Note (Signed)
POC urine - negative UTI -Take OTC AZO medication to help with symptoms as needed -Drink plenty of fluids -Urine culture sent Call if the burning worsens, you develop a fever, back pain or pelvic pain or if your symptom do not resolve after completing the antibiotic.Remember to wipe from front to back and don't hold it in! If possible, empty your bladder every 4 hours.

## 2023-01-04 ENCOUNTER — Other Ambulatory Visit: Payer: Self-pay | Admitting: Family Medicine

## 2023-01-04 DIAGNOSIS — B952 Enterococcus as the cause of diseases classified elsewhere: Secondary | ICD-10-CM

## 2023-01-04 LAB — URINE CULTURE

## 2023-01-04 MED ORDER — AMOXICILLIN-POT CLAVULANATE 500-125 MG PO TABS: 1.0000 | ORAL_TABLET | Freq: Two times a day (BID) | ORAL | 0 refills | Status: AC

## 2023-01-07 ENCOUNTER — Other Ambulatory Visit: Payer: Self-pay | Admitting: Neurology

## 2023-01-08 NOTE — Telephone Encounter (Signed)
Communication from [employer] Patient Assistance Program:  Patient approved for enrollment with [employer] Patient Assistance Program for Rybelsus, enrollment will end on January 23, 2024.  Patient ID: [id number]

## 2023-01-08 NOTE — Telephone Encounter (Signed)
PAP: Patient assistance application for Rybelsus has been approved by PAP Companies: [employer] from 01/24/2023 to 01/23/2024. Medication should be delivered to PAP Delivery: Provider's office For further shipping updates, please contact [employer] at [phone] Pt ID is: [id number]

## 2023-01-08 NOTE — Progress Notes (Signed)
Pharmacy Medication Assistance Program Note    01/08/2023  Patient ID: [name], male   DOB: [birth year], 58 y.o.   MRN: [id number]     12/13/2022 12/28/2022  Outreach Medication One  Initial Outreach Date (Medication One) 12/13/2022   Manufacturer Medication One [facility] Rybelsus Rybelsus  Dose of Rybelsus 14 mg 14mg /day  Type of Forensic scientist Assistance  Date Application Sent to Patient 12/13/2022   Application Items Requested Application   Date Application Sent to Prescriber 12/13/2022   Name of Prescriber Mamatha Sirivol mamatha sirivol  Date Application Received From Patient 12/13/2022   Application Items Received From Patient Application   Date Application Received From Provider 12/13/2022 12/28/2022  Date Application Submitted to Manufacturer 12/13/2022 12/28/2022  Method Application Sent to Manufacturer Online   Patient Assistance Determination  Approved  Approval Start Date  01/24/2023  Approval End Date  01/23/2024  Patient Notification Method  Telephone Call  Telephone Call Outcome  Successful     Signature Tresea Mall, CPHT/Patient [facility] Direct Line: [phone] Fax: [phone]

## 2023-01-25 ENCOUNTER — Telehealth: Payer: Self-pay | Admitting: Family Medicine

## 2023-01-25 MED ORDER — SEMAGLUTIDE 14 MG PO TABS: 14.0000 mg | ORAL_TABLET | Freq: Every day | ORAL | 0 refills | Status: DC

## 2023-01-25 NOTE — Telephone Encounter (Signed)
 Copied from CRM [id number]. Topic: Clinical - Medication Refill >> Jan 22, 2023  9:47 AM Joesph PARAS wrote: Most Recent Primary Care Visit:  Provider: TERESSA HARRIE HERO  Department: [facility]  Visit Type: OFFICE VISIT  Date: 01/01/2023  Medication: RYBELSUS   Has the patient contacted their pharmacy? No - Patient states its coming up to end of year and he needs a new script sent in. See prescription on discontinued list so sending request.  Is this the correct pharmacy for this prescription? Yes  This is the patient's preferred pharmacy:  [facility] [address] [place] - [address] [place] [address] Phone: [phone] Fax: [phone]   Has the prescription been filled recently? No  Is the patient out of the medication? No  Has the patient been seen for an appointment in the last year OR does the patient have an upcoming appointment? Yes  Can we respond through MyChart? No - Phone Call  Agent: Please be advised that Rx refills may take up to 3 business days. We ask that you follow-up with your pharmacy.

## 2023-01-29 NOTE — Telephone Encounter (Signed)
 Patient receives this medication via patient assistance [employer]).

## 2023-02-07 ENCOUNTER — Other Ambulatory Visit: Payer: Self-pay | Admitting: Neurology

## 2023-02-20 ENCOUNTER — Telehealth: Payer: Self-pay

## 2023-02-20 NOTE — Telephone Encounter (Signed)
Patient is aware that patient assistance is here to be picked up

## 2023-02-20 NOTE — Telephone Encounter (Signed)
Called patient to inform him of his patient assist is here, no answer or voicemail. Will try again later.

## 2023-02-20 NOTE — Telephone Encounter (Signed)
Called patient. Spoke with his dad. Informed him that [name]'s patient assist is ready to pick up.

## 2023-02-27 ENCOUNTER — Encounter: Payer: Medicare HMO | Admitting: Psychology

## 2023-03-22 ENCOUNTER — Other Ambulatory Visit: Payer: Self-pay | Admitting: Neurology

## 2023-03-22 NOTE — Telephone Encounter (Signed)
 Rx refilled per last appointment note.   "4.  Restless leg syndrome -To simplify medication regimen, try to reduce Mirapex 0.125 mg to once daily at bedtime"

## 2023-03-29 ENCOUNTER — Encounter: Payer: Self-pay | Admitting: Psychology

## 2023-03-29 ENCOUNTER — Encounter: Attending: Psychology | Admitting: Psychology

## 2023-03-29 DIAGNOSIS — R569 Unspecified convulsions: Secondary | ICD-10-CM | POA: Diagnosis not present

## 2023-03-29 DIAGNOSIS — Q039 Congenital hydrocephalus, unspecified: Secondary | ICD-10-CM | POA: Diagnosis not present

## 2023-03-29 DIAGNOSIS — R269 Unspecified abnormalities of gait and mobility: Secondary | ICD-10-CM | POA: Insufficient documentation

## 2023-03-29 DIAGNOSIS — F330 Major depressive disorder, recurrent, mild: Secondary | ICD-10-CM | POA: Insufficient documentation

## 2023-03-29 DIAGNOSIS — R413 Other amnesia: Secondary | ICD-10-CM | POA: Diagnosis not present
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
## 2023-04-16 NOTE — Progress Notes (Unsigned)
 Subjective:  Patient ID: [name], male    DOB: 1965-10-01  Age: 58 y.o. MRN: [id number]  Chief Complaint  Patient presents with   Medical Management of Chronic Issues   Discussed the use of AI scribe software for clinical note transcription with the patient, who gave verbal consent to proceed.  History of Present Illness  The patient, with a history of hypertension, depression, diabetes, memory problems, adrenal insufficiency, and a prostate condition, presents for a routine follow-up visit. He reports no new health issues since his last visit. He is currently on multiple medications including valsartan hydrochlorothiazide, amlodipine, venlafaxine, semaglutide, memantine, donepezil, hydrocortisone, fludrocortisone, finasteride, levetiracetam, and VESIcare.  The patient's depression is currently managed with venlafaxine, which he takes one pill daily. He reports feeling good on this regimen and has not experienced any significant depressive episodes recently. However, the patient and his companion are considering discontinuing the venlafaxine, with a plan to taper off the medication by taking it every other day for a couple of weeks before stopping completely.  The patient's memory problems have reportedly worsened, with instances of forgetting conversations within an hour of having them. He is currently on memantine and donepezil for memory issues and is under the care of a neurologist.  The patient also has a history of seizures, for which he takes levetiracetam. He reports no recent seizures. He is also on medication for adrenal insufficiency and a prostate condition, and reports no new issues related to these conditions.     Patient is a 58 year-old white male with past medical history of prediabetes, seizure disorder hypertension, morbid obesity, congenital hydrocephalus, adrenal insufficiency hyponatremia, and memory loss.   Has depression with anxiety, which is well controlled  on Effexor extended release 75 mg 1 capsules in AM.    Restless leg syndrome currently on Mirapex 0.125 mg 1 tablet twice daily.  Hypertension is well controlled on valsartan 320-25 mg once daily and amlodipine 10 mg daily.    Adrenal insufficiency/hyponatremia: Currently patient is on hydrocortisone 10 mg once daily and sodium chloride 1000 mg 3 times a day. Sees March Rummage, NP.   Memory loss: Currently on Namenda 10 mg twice daily and donepezil 10 mg once daily. Memory worsened.       04/17/2023    9:44 AM 10/19/2022    9:17 AM 04/17/2022    9:28 AM 01/11/2022    4:54 PM 11/25/2021    7:42 AM  Depression screen PHQ 2/9  Decreased Interest 0 0 0 2 0  Down, Depressed, Hopeless 0 0 0 0 0  PHQ - 2 Score 0 0 0 2 0  Altered sleeping 0 0 0 1   Tired, decreased energy 0 1 0 0   Change in appetite 0 3 1 1    Feeling bad or failure about yourself  0 0 0 0   Trouble concentrating 0 0 0 0   Moving slowly or fidgety/restless 0 0 0 0   Suicidal thoughts 0 0 0 0   PHQ-9 Score 0 4 1 4    Difficult doing work/chores Not difficult at all Not difficult at all Not difficult at all Not difficult at all         04/17/2023    9:44 AM  Fall Risk   Falls in the past year? 0  Number falls in past yr: 0  Injury with Fall? 0  Risk for fall due to : No Fall Risks  Follow up Falls evaluation completed  Patient Care Team: Blane Ohara, MD as PCP - General (Family Medicine) York Spaniel, MD (Inactive) as Consulting Physician (Neurology) Loreli Slot (Internal Medicine) Windell Norfolk, MD as Consulting Physician (Neurology) Berneice Heinrich Delbert Phenix., MD as Consulting Physician (Urology) Misenheimer, Marcial Pacas, MD as Consulting Physician (Unknown Physician Specialty)   Review of Systems  Constitutional:  Negative for chills, diaphoresis, fatigue and fever.  HENT:  Negative for congestion, ear pain and sore throat.   Respiratory:  Negative for cough and shortness of breath.    Cardiovascular:  Negative for chest pain and leg swelling.  Gastrointestinal:  Negative for abdominal pain, constipation, diarrhea, nausea and vomiting.  Genitourinary:  Negative for dysuria and urgency.  Musculoskeletal:  Negative for arthralgias and myalgias.  Neurological:  Negative for dizziness and headaches.  Psychiatric/Behavioral:  Negative for dysphoric mood.     Current Outpatient Medications on File Prior to Visit  Medication Sig Dispense Refill   amLODipine (NORVASC) 10 MG tablet Take 1 tablet by mouth once daily 90 tablet 1   donepezil (ARICEPT) 10 MG tablet Take 1 tablet (10 mg total) by mouth at bedtime. 90 tablet 1   finasteride (PROSCAR) 5 MG tablet Take 5 mg by mouth daily.     fludrocortisone (FLORINEF) 0.1 MG tablet Take 0.2 mg by mouth 2 (two) times daily.     hydrocortisone (CORTEF) 10 MG tablet Take 10 mg by mouth daily.     levETIRAcetam (KEPPRA) 1000 MG tablet Take 1 tablet by mouth twice daily 180 tablet 0   Melatonin 5 MG TABS Take by mouth at bedtime.     memantine (NAMENDA) 10 MG tablet Take 1 tablet by mouth twice daily 180 tablet 0   pramipexole (MIRAPEX) 0.125 MG tablet Take 1 tablet (0.125 mg total) by mouth at bedtime. 90 tablet 0   Semaglutide 14 MG TABS Take 1 tablet (14 mg total) by mouth daily. 90 tablet 0   SODIUM CHLORIDE PO Take 1,000 mg by mouth in the morning, at noon, and at bedtime.     solifenacin (VESICARE) 10 MG tablet Take 10 mg by mouth daily.     valsartan-hydrochlorothiazide (DIOVAN-HCT) 320-25 MG tablet Take 1 tablet by mouth once daily 90 tablet 1   No current facility-administered medications on file prior to visit.   Past Medical History:  Diagnosis Date   Anxiety    Aqueductal stenosis (HCC)    Atrial fibrillation (HCC)    Cardiomyopathy    Carpal tunnel syndrome    Chiari I malformation (HCC)    Coronary artery disease    Depression    Headache(784.0)    Hydrocephalus (HCC)    Hypertension    Memory loss     Obstructive sleep apnea    Personal history of urinary calculi 01/03/2012   Pulmonary embolism (HCC)    Renal calculi    Restless leg syndrome    RLS (restless legs syndrome) 05/04/2020   Salt-wasting syndrome of infancy    Seizure (HCC)    SIADH (syndrome of inappropriate ADH production) (HCC)    Syncope    Recurrent, thought to be neurally mediated   Past Surgical History:  Procedure Laterality Date   CHOLECYSTECTOMY     LOOP RECORDER EXPLANT N/A 10/30/2012   Procedure: LOOP RECORDER EXPLANT;  Surgeon: Duke Salvia, MD;  Location: [facility] CATH LAB;  Service: Cardiovascular;  Laterality: N/A;   Nissen Fundoplasty     PROSTATE SURGERY     St[employer] Jude DM [phone] loop  S/p loop recorder   VENTRICULOPERITONEAL SHUNT Bilateral     Family History  Problem Relation Age of Onset   Migraines Mother    CAD Mother    Hypertension Mother    Endometriosis Sister    Seizures Neg Hx    Social History   Socioeconomic History   Marital status: Widowed    Spouse name: Chipper Oman   Number of children: 0   Years of education: 12   Highest education level: Not on file  Occupational History   Occupation: CUSTOMER SERVICE    Employer: [id number]    Comment: Not working   Tobacco Use   Smoking status: Never   Smokeless tobacco: Never  Vaping Use   Vaping status: Never Used  Substance and Sexual Activity   Alcohol use: No    Alcohol/week: 0.0 standard drinks of alcohol   Drug use: No   Sexual activity: Not Currently  Other Topics Concern   Not on file  Social History Narrative   Patient's wife passed away, no children   Patient is right handed   Education level is high school graduate   Drinks caffeine free drinks   Social Drivers of Corporate investment banker Strain: Low Risk  (04/17/2023)   Overall Financial Resource Strain (CARDIA)    Difficulty of Paying Living Expenses: Not hard at all  Food Insecurity: No Food Insecurity (04/17/2023)   Hunger Vital Sign    Worried About  Running Out of Food in the Last Year: Never true    Ran Out of Food in the Last Year: Never true  Transportation Needs: No Transportation Needs (04/17/2023)   PRAPARE - Administrator, Civil Service (Medical): No    Lack of Transportation (Non-Medical): No  Physical Activity: Insufficiently Active (04/17/2023)   Exercise Vital Sign    Days of Exercise per Week: 4 days    Minutes of Exercise per Session: 30 min  Stress: No Stress Concern Present (04/17/2023)   [employer] of Occupational Health - Occupational Stress Questionnaire    Feeling of Stress : Not at all  Social Connections: Socially Isolated (04/17/2023)   Social Connection and Isolation Panel [NHANES]    Frequency of Communication with Friends and Family: Three times a week    Frequency of Social Gatherings with Friends and Family: Three times a week    Attends Religious Services: Never    Active Member of Clubs or Organizations: No    Attends Banker Meetings: Never    Marital Status: Divorced    Objective:  BP 110/72   Pulse 98   Temp 97.8 F (36.6 C)   Ht 6\' 4"  (1.93 m)   Wt (!) 343 lb (155.6 kg)   SpO2 98%   BMI 41.75 kg/m      04/17/2023    9:43 AM 01/01/2023    3:08 PM 10/19/2022    9:15 AM  BP/Weight  Systolic BP 110 112 112  Diastolic BP 72 68 70  Wt. (Lbs) 343 338 340  BMI 41.75 kg/m2 41.14 kg/m2 41.39 kg/m2    Physical Exam  Diabetic Foot Exam - Simple   No data filed      Lab Results  Component Value Date   WBC 8.2 04/17/2023   HGB 14.4 04/17/2023   HCT 44.0 04/17/2023   PLT 375 04/17/2023   GLUCOSE 81 04/17/2023   CHOL 127 04/17/2023   TRIG 109 04/17/2023   HDL 39 (L) 04/17/2023   LDLCALC 68  04/17/2023   ALT 29 04/17/2023   AST 28 04/17/2023   NA 134 04/17/2023   K 4.3 04/17/2023   CL 95 (L) 04/17/2023   CREATININE 0.95 04/17/2023   BUN 13 04/17/2023   CO2 24 04/17/2023   TSH 2.840 10/19/2022   INR 1.02 08/07/2011   HGBA1C 6.2 (H) 04/17/2023       Assessment & Plan:  Assessment and Plan   Cognitive Impairment Experiencing worsening memory issues despite being on memantine and donepezil. Neurologist Dr. Teresa Coombs is involved in management. - Discuss worsening memory with neurologist at upcoming appointment  Hypertension Managed with valsartan hydrochlorothiazide at the highest dose and amlodipine 10 mg daily. Blood pressure is controlled.  Prediabetes Managed with Rybelsus (semaglutide) 14 mg daily.  Adrenal Insufficiency Managed with hydrocortisone and fludrocortisone.  Seizure Disorder Managed with levetiracetam (Keppra). No recent seizures.  Benign Prostatic Hyperplasia Managed with finasteride and VESIcare for bladder retention.  Depression Managed with venlafaxine at a low dose. No depressive symptoms. Discussed tapering venlafaxine by taking it every other day for a couple of weeks, with the option to resume if symptoms return. Potential for anxiety upon discontinuation noted. Emphasized monitoring for return of depressive symptoms during tapering. - Taper venlafaxine by taking it every other day for a couple of weeks when ready - Resume venlafaxine if depressive symptoms return  General Health Maintenance Discussed need for Medicare annual wellness visit. - Schedule Medicare annual wellness visit with nurse practitioner or physician assistant  Follow-up Plans include blood work and discussion with neurologist regarding memory issues. - Perform blood work - Schedule follow-up with neurologist for memory issues - Schedule Medicare annual wellness visit        Essential hypertension Assessment & Plan: Managed with valsartan hydrochlorothiazide at the highest dose and amlodipine 10 mg daily. Blood pressure is controlled.  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 Neurologist Dr. Teresa Coombs is involved in management. - Discuss worsening memory with neurologist at upcoming appointment   Seizures [facility]) Assessment & Plan: Managed with levetiracetam (Keppra). No recent seizures.   Prediabetes Assessment & Plan: Managed with Rybelsus (semaglutide) 14 mg daily.  Orders: -     Hemoglobin A1c  Major depressive disorder, single episode, mild (HCC) Assessment & Plan: Managed with venlafaxine at a low dose. No depressive symptoms. Discussed tapering venlafaxine by taking it every other day for a couple of weeks, with the option to resume if symptoms return. Potential for anxiety upon discontinuation noted. Emphasized monitoring for return of depressive symptoms during tapering. - Taper venlafaxine by taking it every other day for a couple of weeks when ready - Resume venlafaxine if depressive symptoms return  Orders: -     Venlafaxine HCl ER; Take 1 capsule (75 mg total) by mouth daily.  Adrenal insufficiency (HCC) Assessment & Plan: Managed with hydrocortisone and fludrocortisone.   Benign prostatic hyperplasia with urinary retention Assessment & Plan: Managed with finasteride and VESIcare for bladder retention.      Meds ordered this encounter  Medications   venlafaxine XR (EFFEXOR-XR) 75 MG 24 hr capsule    Sig: Take 1 capsule (75 mg total) by mouth daily.    Orders Placed This Encounter  Procedures   CBC with Differential/Platelet   Comprehensive metabolic panel  Hemoglobin A1c   Lipid panel     Follow-up: Return in about 6 months (around 10/18/2023) for chronic follow up, awv with KIM or one of new providers due any time. Clayborn Bigness I Leal-Borjas,acting as a scribe for Blane Ohara, MD.,have documented all relevant documentation on the behalf of Blane Ohara, MD,as directed by  Blane Ohara, MD while in the presence of Blane Ohara, MD.   An After  Visit Summary was printed and given to the patient.  I attest that I have reviewed this visit and agree with the plan scribed by my staff.   Blane Ohara, MD [facility] [phone]

## 2023-04-17 ENCOUNTER — Ambulatory Visit (INDEPENDENT_AMBULATORY_CARE_PROVIDER_SITE_OTHER): Payer: Medicare HMO | Admitting: Family Medicine

## 2023-04-17 ENCOUNTER — Encounter: Payer: Self-pay | Admitting: Family Medicine

## 2023-04-17 VITALS — BP 110/72 | HR 98 | Temp 97.8°F | Ht 76.0 in | Wt 343.0 lb

## 2023-04-17 DIAGNOSIS — N401 Enlarged prostate with lower urinary tract symptoms: Secondary | ICD-10-CM | POA: Diagnosis not present

## 2023-04-17 DIAGNOSIS — F0280 Dementia in other diseases classified elsewhere without behavioral disturbance: Secondary | ICD-10-CM

## 2023-04-17 DIAGNOSIS — R569 Unspecified convulsions: Secondary | ICD-10-CM | POA: Diagnosis not present

## 2023-04-17 DIAGNOSIS — F330 Major depressive disorder, recurrent, mild: Secondary | ICD-10-CM

## 2023-04-17 DIAGNOSIS — E119 Type 2 diabetes mellitus without complications: Secondary | ICD-10-CM | POA: Diagnosis not present

## 2023-04-17 DIAGNOSIS — E2740 Unspecified adrenocortical insufficiency: Secondary | ICD-10-CM | POA: Diagnosis not present

## 2023-04-17 DIAGNOSIS — I10 Essential (primary) hypertension: Secondary | ICD-10-CM

## 2023-04-17 DIAGNOSIS — Q039 Congenital hydrocephalus, unspecified: Secondary | ICD-10-CM

## 2023-04-17 DIAGNOSIS — F320 Major depressive disorder, single episode, mild: Secondary | ICD-10-CM

## 2023-04-17 DIAGNOSIS — R338 Other retention of urine: Secondary | ICD-10-CM | POA: Diagnosis not present

## 2023-04-17 DIAGNOSIS — R7303 Prediabetes: Secondary | ICD-10-CM

## 2023-04-17 DIAGNOSIS — E291 Testicular hypofunction: Secondary | ICD-10-CM

## 2023-04-17 LAB — CBC WITH DIFFERENTIAL/PLATELET
Basophils Absolute: 0.1 10*3/uL (ref 0.0–0.2)
Basos: 1 %
EOS (ABSOLUTE): 0.2 10*3/uL (ref 0.0–0.4)
Eos: 3 %
Hematocrit: 44 % (ref 37.5–51.0)
Hemoglobin: 14.4 g/dL (ref 13.0–17.7)
Immature Grans (Abs): 0 10*3/uL (ref 0.0–0.1)
Immature Granulocytes: 0 %
Lymphocytes Absolute: 1.9 10*3/uL (ref 0.7–3.1)
Lymphs: 23 %
MCH: 27.7 pg (ref 26.6–33.0)
MCHC: 32.7 g/dL (ref 31.5–35.7)
MCV: 85 fL (ref 79–97)
Monocytes Absolute: 1 10*3/uL — ABNORMAL HIGH (ref 0.1–0.9)
Monocytes: 12 %
Neutrophils Absolute: 5 10*3/uL (ref 1.4–7.0)
Neutrophils: 61 %
Platelets: 375 10*3/uL (ref 150–450)
RBC: 5.2 x10E6/uL (ref 4.14–5.80)
RDW: 13.8 % (ref 11.6–15.4)
WBC: 8.2 10*3/uL (ref 3.4–10.8)

## 2023-04-17 LAB — COMPREHENSIVE METABOLIC PANEL
ALT: 29 IU/L (ref 0–44)
AST: 28 IU/L (ref 0–40)
Albumin: 4 g/dL (ref 3.8–4.9)
Alkaline Phosphatase: 85 IU/L (ref 44–121)
BUN/Creatinine Ratio: 14 (ref 9–20)
BUN: 13 mg/dL (ref 6–24)
Bilirubin Total: 1 mg/dL (ref 0.0–1.2)
CO2: 24 mmol/L (ref 20–29)
Calcium: 9.3 mg/dL (ref 8.7–10.2)
Chloride: 95 mmol/L — ABNORMAL LOW (ref 96–106)
Creatinine, Ser: 0.95 mg/dL (ref 0.76–1.27)
Globulin, Total: 2.8 g/dL (ref 1.5–4.5)
Glucose: 81 mg/dL (ref 70–99)
Potassium: 4.3 mmol/L (ref 3.5–5.2)
Sodium: 134 mmol/L (ref 134–144)
Total Protein: 6.8 g/dL (ref 6.0–8.5)
eGFR: 93 mL/min/{1.73_m2} (ref >59)

## 2023-04-17 LAB — LIPID PANEL
Chol/HDL Ratio: 3.3 ratio (ref 0.0–5.0)
Cholesterol, Total: 127 mg/dL (ref 100–199)
HDL: 39 mg/dL — ABNORMAL LOW (ref >39)
LDL Chol Calc (NIH): 68 mg/dL (ref 0–99)
Triglycerides: 109 mg/dL (ref 0–149)
VLDL Cholesterol Cal: 20 mg/dL (ref 5–40)

## 2023-04-17 LAB — HEMOGLOBIN A1C
Est. average glucose Bld gHb Est-mCnc: 131 mg/dL
Hgb A1c MFr Bld: 6.2 % — ABNORMAL HIGH (ref 4.8–5.6)

## 2023-04-17 MED ORDER — VENLAFAXINE HCL ER 75 MG PO CP24
75.0000 mg | ORAL_CAPSULE | Freq: Every day | ORAL | Status: DC
Start: 2023-04-17 — End: 2023-07-15

## 2023-04-18 ENCOUNTER — Encounter: Payer: Self-pay | Admitting: Family Medicine

## 2023-04-18 DIAGNOSIS — F330 Major depressive disorder, recurrent, mild: Secondary | ICD-10-CM | POA: Insufficient documentation

## 2023-04-18 DIAGNOSIS — F320 Major depressive disorder, single episode, mild: Secondary | ICD-10-CM | POA: Insufficient documentation

## 2023-04-18 NOTE — Assessment & Plan Note (Signed)
 Well controlled.  Continue with valsartan 320 mg/25 mg once daily, amlodipine 10 mg daily. Continue to work on eating a healthy diet and exercise.  Labs drawn today.

## 2023-04-18 NOTE — Assessment & Plan Note (Signed)
 Managed with finasteride and VESIcare for bladder retention.

## 2023-04-18 NOTE — Assessment & Plan Note (Signed)
 Managed with levetiracetam (Keppra). No recent seizures.

## 2023-04-18 NOTE — Assessment & Plan Note (Addendum)
 Managed with hydrocortisone and fludrocortisone. Management per specialist.

## 2023-04-18 NOTE — Assessment & Plan Note (Signed)
 Managed with venlafaxine at a low dose. No depressive symptoms. Discussed tapering venlafaxine by taking it every other day for a couple of weeks, with the option to resume if symptoms return. Potential for anxiety upon discontinuation noted. Emphasized monitoring for return of depressive symptoms during tapering. - Taper venlafaxine by taking it every other day for a couple of weeks when ready - Resume venlafaxine if depressive symptoms return

## 2023-04-18 NOTE — Assessment & Plan Note (Signed)
 Managed with Rybelsus (semaglutide) 14 mg daily.

## 2023-04-18 NOTE — Progress Notes (Incomplete)
 Subjective:  Patient ID: [name], male    DOB: 1965/08/21  Age: 58 y.o. MRN: [id number]  Chief Complaint  Patient presents with  . Medical Management of Chronic Issues   Discussed the use of AI scribe software for clinical note transcription with the patient, who gave verbal consent to proceed.  History of Present Illness   The patient, with a history of hypertension, depression, diabetes, memory problems, adrenal insufficiency, and a prostate condition, presents for a routine follow-up visit. He reports no new health issues since his last visit.   The patient's depression is currently managed with venlafaxine, which he takes one pill daily. He reports feeling good on this regimen and has not experienced any significant depressive episodes recently. However, the patient and his father are considering discontinuing the venlafaxine and we discussed a plan to taper off the medication by taking it every other day for a couple of weeks before stopping completely.  The patient also has a history of seizures, for which he takes levetiracetam. He reports no recent seizures. He is also on medication for adrenal insufficiency and a prostate condition, and reports no new issues related to these conditions.     Restless leg syndrome currently on Mirapex 0.125 mg 1 tablet twice daily.  Hypertension is well controlled on valsartan 320-25 mg once daily and amlodipine 10 mg daily.    Adrenal insufficiency/hyponatremia: Currently patient is on hydrocortisone 10 mg once daily and sodium chloride 1000 mg 3 times a day. Sees March Rummage, NP.   Memory loss: Currently on Namenda 10 mg twice daily and donepezil 10 mg once daily. Memory worsened.   Diabetes: currently on rybelsus 14 mg daily. Doing well. Tries to eat healthy and walks.      04/17/2023    9:44 AM 10/19/2022    9:17 AM 04/17/2022    9:28 AM 01/11/2022    4:54 PM 11/25/2021    7:42 AM  Depression screen PHQ 2/9  Decreased Interest 0  0 0 2 0  Down, Depressed, Hopeless 0 0 0 0 0  PHQ - 2 Score 0 0 0 2 0  Altered sleeping 0 0 0 1   Tired, decreased energy 0 1 0 0   Change in appetite 0 3 1 1    Feeling bad or failure about yourself  0 0 0 0   Trouble concentrating 0 0 0 0   Moving slowly or fidgety/restless 0 0 0 0   Suicidal thoughts 0 0 0 0   PHQ-9 Score 0 4 1 4    Difficult doing work/chores Not difficult at all Not difficult at all Not difficult at all Not difficult at all         04/17/2023    9:44 AM  Fall Risk   Falls in the past year? 0  Number falls in past yr: 0  Injury with Fall? 0  Risk for fall due to : No Fall Risks  Follow up Falls evaluation completed    Patient Care Team: Blane Ohara, MD as PCP - General (Family Medicine) York Spaniel, MD (Inactive) as Consulting Physician (Neurology) Yetta Barre, Jeanann Lewandowsky (Internal Medicine) Windell Norfolk, MD as Consulting Physician (Neurology) Berneice Heinrich Delbert Phenix., MD as Consulting Physician (Urology) Misenheimer, Marcial Pacas, MD as Consulting Physician (Unknown Physician Specialty)   Review of Systems  Constitutional:  Negative for chills, diaphoresis, fatigue and fever.  HENT:  Negative for congestion, ear pain and sore throat.   Respiratory:  Negative for cough  and shortness of breath.   Cardiovascular:  Negative for chest pain and leg swelling.  Gastrointestinal:  Negative for abdominal pain, constipation, diarrhea, nausea and vomiting.  Genitourinary:  Negative for dysuria and urgency.  Musculoskeletal:  Negative for arthralgias and myalgias.  Neurological:  Negative for dizziness and headaches.  Psychiatric/Behavioral:  Negative for dysphoric mood.     Current Outpatient Medications on File Prior to Visit  Medication Sig Dispense Refill  . amLODipine (NORVASC) 10 MG tablet Take 1 tablet by mouth once daily 90 tablet 1  . donepezil (ARICEPT) 10 MG tablet Take 1 tablet (10 mg total) by mouth at bedtime. 90 tablet 1  . finasteride  (PROSCAR) 5 MG tablet Take 5 mg by mouth daily.    . fludrocortisone (FLORINEF) 0.1 MG tablet Take 0.2 mg by mouth 2 (two) times daily.    . hydrocortisone (CORTEF) 10 MG tablet Take 10 mg by mouth daily.    Marland Kitchen levETIRAcetam (KEPPRA) 1000 MG tablet Take 1 tablet by mouth twice daily 180 tablet 0  . Melatonin 5 MG TABS Take by mouth at bedtime.    . memantine (NAMENDA) 10 MG tablet Take 1 tablet by mouth twice daily 180 tablet 0  . pramipexole (MIRAPEX) 0.125 MG tablet Take 1 tablet (0.125 mg total) by mouth at bedtime. 90 tablet 0  . Semaglutide 14 MG TABS Take 1 tablet (14 mg total) by mouth daily. 90 tablet 0  . SODIUM CHLORIDE PO Take 1,000 mg by mouth in the morning, at noon, and at bedtime.    . solifenacin (VESICARE) 10 MG tablet Take 10 mg by mouth daily.    . valsartan-hydrochlorothiazide (DIOVAN-HCT) 320-25 MG tablet Take 1 tablet by mouth once daily 90 tablet 1   No current facility-administered medications on file prior to visit.   Past Medical History:  Diagnosis Date  . Anxiety   . Aqueductal stenosis (HCC)   . Atrial fibrillation (HCC)   . Cardiomyopathy   . Carpal tunnel syndrome   . Chiari I malformation (HCC)   . Coronary artery disease   . Depression   . Headache(784.0)   . Hydrocephalus (HCC)   . Hypertension   . Memory loss   . Obstructive sleep apnea   . Personal history of urinary calculi 01/03/2012  . Pulmonary embolism (HCC)   . Renal calculi   . Restless leg syndrome   . RLS (restless legs syndrome) 05/04/2020  . Salt-wasting syndrome of infancy   . Seizure (HCC)   . SIADH (syndrome of inappropriate ADH production) (HCC)   . Syncope    Recurrent, thought to be neurally mediated   Past Surgical History:  Procedure Laterality Date  . CHOLECYSTECTOMY    . LOOP RECORDER EXPLANT N/A 10/30/2012   Procedure: LOOP RECORDER EXPLANT;  Surgeon: Duke Salvia, MD;  Location: [facility] CATH LAB;  Service: Cardiovascular;  Laterality: N/A;  . Nissen Fundoplasty    .  PROSTATE SURGERY    . St. Jude DM [phone] loop     S/p loop recorder  . VENTRICULOPERITONEAL SHUNT Bilateral     Family History  Problem Relation Age of Onset  . Migraines Mother   . CAD Mother   . Hypertension Mother   . Endometriosis Sister   . Seizures Neg Hx    Social History   Socioeconomic History  . Marital status: Widowed    Spouse name: Chipper Oman  . Number of children: 0  . Years of education: 55  . Highest education level:  Not on file  Occupational History  . Occupation: CUSTOMER SERVICE    Employer: [id number]    Comment: Not working   Tobacco Use  . Smoking status: Never  . Smokeless tobacco: Never  Vaping Use  . Vaping status: Never Used  Substance and Sexual Activity  . Alcohol use: No    Alcohol/week: 0.0 standard drinks of alcohol  . Drug use: No  . Sexual activity: Not Currently  Other Topics Concern  . Not on file  Social History Narrative   Patient's wife passed away, no children   Patient is right handed   Education level is high school graduate   Drinks caffeine free drinks   Social Drivers of Corporate investment banker Strain: Low Risk  (04/17/2023)   Overall Financial Resource Strain (CARDIA)   . Difficulty of Paying Living Expenses: Not hard at all  Food Insecurity: No Food Insecurity (04/17/2023)   Hunger Vital Sign   . Worried About Programme researcher, broadcasting/film/video in the Last Year: Never true   . Ran Out of Food in the Last Year: Never true  Transportation Needs: No Transportation Needs (04/17/2023)   PRAPARE - Transportation   . Lack of Transportation (Medical): No   . Lack of Transportation (Non-Medical): No  Physical Activity: Insufficiently Active (04/17/2023)   Exercise Vital Sign   . Days of Exercise per Week: 4 days   . Minutes of Exercise per Session: 30 min  Stress: No Stress Concern Present (04/17/2023)   [employer] of Occupational Health - Occupational Stress Questionnaire   . Feeling of Stress : Not at all  Social Connections:  Socially Isolated (04/17/2023)   Social Connection and Isolation Panel [NHANES]   . Frequency of Communication with Friends and Family: Three times a week   . Frequency of Social Gatherings with Friends and Family: Three times a week   . Attends Religious Services: Never   . Active Member of Clubs or Organizations: No   . Attends Banker Meetings: Never   . Marital Status: Divorced    Objective:  BP 110/72   Pulse 98   Temp 97.8 F (36.6 C)   Ht 6\' 4"  (1.93 m)   Wt (!) 343 lb (155.6 kg)   SpO2 98%   BMI 41.75 kg/m      04/17/2023    9:43 AM 01/01/2023    3:08 PM 10/19/2022    9:15 AM  BP/Weight  Systolic BP 110 112 112  Diastolic BP 72 68 70  Wt. (Lbs) 343 338 340  BMI 41.75 kg/m2 41.14 kg/m2 41.39 kg/m2    Physical Exam Vitals reviewed.  Constitutional:      Appearance: Normal appearance. He is obese.  Neck:     Vascular: No carotid bruit.  Cardiovascular:     Rate and Rhythm: Normal rate and regular rhythm.     Heart sounds: Normal heart sounds.  Pulmonary:     Effort: Pulmonary effort is normal.     Breath sounds: Normal breath sounds. No wheezing, rhonchi or rales.  Abdominal:     General: Bowel sounds are normal.     Palpations: Abdomen is soft.     Tenderness: There is no abdominal tenderness.  Neurological:     Mental Status: He is alert and oriented to person, place, and time.  Psychiatric:        Mood and Affect: Mood normal.        Behavior: Behavior normal.     Diabetic  Foot Exam - Simple   Simple Foot Form Diabetic Foot exam was performed with the following findings: Yes 04/17/2023 12:01 AM  Visual Inspection See comments: Yes Sensation Testing Intact to touch and monofilament testing bilaterally: Yes Pulse Check Posterior Tibialis and Dorsalis pulse intact bilaterally: Yes Comments Thickened toenails on both feet. Mild pedal edema BL.       Lab Results  Component Value Date   WBC 8.2 04/17/2023   HGB 14.4 04/17/2023   HCT  44.0 04/17/2023   PLT 375 04/17/2023   GLUCOSE 81 04/17/2023   CHOL 127 04/17/2023   TRIG 109 04/17/2023   HDL 39 (L) 04/17/2023   LDLCALC 68 04/17/2023   ALT 29 04/17/2023   AST 28 04/17/2023   NA 134 04/17/2023   K 4.3 04/17/2023   CL 95 (L) 04/17/2023   CREATININE 0.95 04/17/2023   BUN 13 04/17/2023   CO2 24 04/17/2023   TSH 2.840 10/19/2022   INR 1.02 08/07/2011   HGBA1C 6.2 (H) 04/17/2023      Assessment & Plan:  Assessment and Plan    Cognitive Impairment Experiencing worsening memory issues despite being on memantine and donepezil. Neurologist Dr. Teresa Coombs is involved in management. - Discuss worsening memory with neurologist at upcoming appointment  Hypertension Managed with valsartan hydrochlorothiazide at the highest dose and amlodipine 10 mg daily. Blood pressure is controlled.  Prediabetes Managed with Rybelsus (semaglutide) 14 mg daily.  Adrenal Insufficiency Managed with hydrocortisone and fludrocortisone.  Seizure Disorder Managed with levetiracetam (Keppra). No recent seizures.  Benign Prostatic Hyperplasia Managed with finasteride and VESIcare for bladder retention.  Depression Managed with venlafaxine at a low dose. No depressive symptoms. Discussed tapering venlafaxine by taking it every other day for a couple of weeks, with the option to resume if symptoms return. Potential for anxiety upon discontinuation noted. Emphasized monitoring for return of depressive symptoms during tapering. - Taper venlafaxine by taking it every other day for a couple of weeks when ready - Resume venlafaxine if depressive symptoms return  General Health Maintenance Discussed need for Medicare annual wellness visit. - Schedule Medicare annual wellness visit with nurse practitioner or physician assistant  Follow-up Plans include blood work and discussion with neurologist regarding memory issues. - Perform blood work - Schedule follow-up with neurologist for memory  issues - Schedule Medicare annual wellness visit         Essential hypertension Assessment & Plan: Managed with valsartan hydrochlorothiazide at the highest dose and amlodipine 10 mg daily. Blood pressure is controlled.  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 Neurologist Dr. Teresa Coombs is involved in management. - Discuss worsening memory with neurologist at upcoming appointment   Seizures [facility]) Assessment & Plan: Managed with levetiracetam (Keppra). No recent seizures.   Prediabetes Assessment & Plan: Managed with Rybelsus (semaglutide) 14 mg daily.  Orders: -     Hemoglobin A1c  Major depressive disorder, single episode, mild (HCC) Assessment & Plan: Managed with venlafaxine at a low dose. No depressive symptoms. Discussed tapering venlafaxine by taking it every other day for a couple of weeks, with the option to resume if symptoms return. Potential for anxiety upon discontinuation noted. Emphasized monitoring for return of depressive symptoms during tapering. - Taper venlafaxine by taking it every other day for  a couple of weeks when ready - Resume venlafaxine if depressive symptoms return  Orders: -     Venlafaxine HCl ER; Take 1 capsule (75 mg total) by mouth daily.  Adrenal insufficiency (HCC) Assessment & Plan: Managed with hydrocortisone and fludrocortisone.   Benign prostatic hyperplasia with urinary retention Assessment & Plan: Managed with finasteride and VESIcare for bladder retention.      Meds ordered this encounter  Medications  . venlafaxine XR (EFFEXOR-XR) 75 MG 24 hr capsule    Sig: Take 1 capsule (75 mg total) by mouth daily.    Orders Placed This Encounter  Procedures  . CBC with Differential/Platelet  . Comprehensive  metabolic panel  . Hemoglobin A1c  . Lipid panel     Follow-up: Return in about 6 months (around 10/18/2023) for chronic follow up, awv with KIM or one of new providers due any time. Clayborn Bigness I Leal-Borjas,acting as a scribe for Blane Ohara, MD.,have documented all relevant documentation on the behalf of Blane Ohara, MD,as directed by  Blane Ohara, MD while in the presence of Blane Ohara, MD.   An After Visit Summary was printed and given to the patient.  I attest that I have reviewed this visit and agree with the plan scribed by my staff.   Blane Ohara, MD [facility] [phone]

## 2023-04-18 NOTE — Assessment & Plan Note (Signed)
 Experiencing worsening memory issues despite being on memantine and donepezil. Neurologist Dr. Teresa Coombs is involved in management. - Discuss worsening memory with neurologist at upcoming appointment

## 2023-04-19 ENCOUNTER — Encounter: Payer: Self-pay | Admitting: Family Medicine

## 2023-04-19 ENCOUNTER — Other Ambulatory Visit: Payer: Self-pay | Admitting: Family Medicine

## 2023-04-19 DIAGNOSIS — R413 Other amnesia: Secondary | ICD-10-CM

## 2023-04-19 NOTE — Assessment & Plan Note (Signed)
 Management per specialist.

## 2023-04-25 ENCOUNTER — Ambulatory Visit: Payer: Medicare HMO | Admitting: Neurology

## 2023-04-26 DIAGNOSIS — Z125 Encounter for screening for malignant neoplasm of prostate: Secondary | ICD-10-CM | POA: Diagnosis not present

## 2023-05-03 DIAGNOSIS — R3915 Urgency of urination: Secondary | ICD-10-CM | POA: Diagnosis not present

## 2023-05-03 DIAGNOSIS — Z125 Encounter for screening for malignant neoplasm of prostate: Secondary | ICD-10-CM | POA: Diagnosis not present

## 2023-05-03 DIAGNOSIS — E349 Endocrine disorder, unspecified: Secondary | ICD-10-CM | POA: Diagnosis not present

## 2023-05-16 ENCOUNTER — Other Ambulatory Visit: Payer: Self-pay | Admitting: Neurology

## 2023-05-23 ENCOUNTER — Telehealth: Payer: Self-pay

## 2023-05-23 NOTE — Telephone Encounter (Signed)
 CALLED AND INFORMED PATIENT'S FATHER OF RECEIVED PATIENT ASSISTANCE FOR RYBELSUS  14 MG AND HE STATED EITHER HIM OR HIS DAUGHTER WILL BE BY TO PICK UP THE MEDICATION FOR THE PATIENT.  RECEIVED: RYBELSUS  14 MG - 3 BOXES   NDC: [id number] LOT: [id number] EXP: 09/22/24

## 2023-06-27 ENCOUNTER — Encounter: Attending: Psychology | Admitting: Psychology

## 2023-06-27 ENCOUNTER — Ambulatory Visit: Payer: Medicare HMO | Admitting: Psychology

## 2023-06-27 ENCOUNTER — Encounter: Payer: Self-pay | Admitting: Psychology

## 2023-06-27 DIAGNOSIS — R569 Unspecified convulsions: Secondary | ICD-10-CM | POA: Insufficient documentation

## 2023-06-27 DIAGNOSIS — R413 Other amnesia: Secondary | ICD-10-CM | POA: Diagnosis not present

## 2023-06-27 DIAGNOSIS — F330 Major depressive disorder, recurrent, mild: Secondary | ICD-10-CM | POA: Diagnosis not present

## 2023-06-27 DIAGNOSIS — Q039 Congenital hydrocephalus, unspecified: Secondary | ICD-10-CM | POA: Diagnosis not present

## 2023-06-27 DIAGNOSIS — R269 Unspecified abnormalities of gait and mobility: Secondary | ICD-10-CM | POA: Diagnosis not present
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
## 2023-06-30 ENCOUNTER — Other Ambulatory Visit: Payer: Self-pay | Admitting: Family Medicine

## 2023-06-30 ENCOUNTER — Other Ambulatory Visit: Payer: Self-pay | Admitting: Neurology

## 2023-06-30 DIAGNOSIS — I10 Essential (primary) hypertension: Secondary | ICD-10-CM

## 2023-07-14 ENCOUNTER — Other Ambulatory Visit: Payer: Self-pay | Admitting: Neurology

## 2023-07-14 ENCOUNTER — Other Ambulatory Visit: Payer: Self-pay | Admitting: Family Medicine

## 2023-07-14 DIAGNOSIS — F320 Major depressive disorder, single episode, mild: Secondary | ICD-10-CM

## 2023-07-16 ENCOUNTER — Telehealth: Payer: Self-pay | Admitting: Neurology

## 2023-07-16 NOTE — Telephone Encounter (Signed)
 Request to r/s appointment due to a conflict

## 2023-07-31 ENCOUNTER — Ambulatory Visit: Payer: Medicare HMO | Admitting: Neurology

## 2023-08-23 ENCOUNTER — Other Ambulatory Visit: Payer: Self-pay | Admitting: Neurology

## 2023-08-23 ENCOUNTER — Ambulatory Visit: Payer: Medicare HMO | Admitting: Psychology

## 2023-08-25 ENCOUNTER — Other Ambulatory Visit: Payer: Self-pay | Admitting: Neurology

## 2023-08-25 ENCOUNTER — Other Ambulatory Visit: Payer: Self-pay | Admitting: Family Medicine

## 2023-08-25 DIAGNOSIS — R413 Other amnesia: Secondary | ICD-10-CM

## 2023-09-26 DIAGNOSIS — E119 Type 2 diabetes mellitus without complications: Secondary | ICD-10-CM | POA: Diagnosis not present

## 2023-09-26 DIAGNOSIS — E2740 Unspecified adrenocortical insufficiency: Secondary | ICD-10-CM | POA: Diagnosis not present

## 2023-09-26 DIAGNOSIS — E871 Hypo-osmolality and hyponatremia: Secondary | ICD-10-CM | POA: Diagnosis not present
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
## 2023-10-09 ENCOUNTER — Encounter: Payer: Self-pay | Admitting: Neurology

## 2023-10-09 ENCOUNTER — Telehealth: Payer: Self-pay

## 2023-10-09 ENCOUNTER — Ambulatory Visit: Admitting: Neurology

## 2023-10-09 VITALS — BP 116/81 | HR 89 | Ht 75.0 in | Wt 348.6 lb

## 2023-10-09 DIAGNOSIS — Q039 Congenital hydrocephalus, unspecified: Secondary | ICD-10-CM | POA: Diagnosis not present

## 2023-10-09 DIAGNOSIS — R569 Unspecified convulsions: Secondary | ICD-10-CM | POA: Diagnosis not present

## 2023-10-09 DIAGNOSIS — G2581 Restless legs syndrome: Secondary | ICD-10-CM

## 2023-10-09 DIAGNOSIS — R413 Other amnesia: Secondary | ICD-10-CM | POA: Diagnosis not present

## 2023-10-09 MED ORDER — PRAMIPEXOLE DIHYDROCHLORIDE 0.125 MG PO TABS: 0.1250 mg | ORAL_TABLET | Freq: Every day | ORAL | 3 refills | Status: DC

## 2023-10-09 MED ORDER — MEMANTINE HCL 10 MG PO TABS: 10.0000 mg | ORAL_TABLET | Freq: Two times a day (BID) | ORAL | 3 refills | Status: AC

## 2023-10-09 MED ORDER — LEVETIRACETAM 1000 MG PO TABS: 1000.0000 mg | ORAL_TABLET | Freq: Two times a day (BID) | ORAL | 4 refills | Status: AC

## 2023-10-09 NOTE — Patient Instructions (Signed)
 CT head Continue Keppra  for seizure prevention Continue Mirapex  for restless legs Recommend sleep consult to rule out sleep apnea, please consider and let me know if you change your mind Stay active, exercise, participate in brain stimulating activity.  Good management of anxiety and depression. Follow-up in 1 year.  Thanks

## 2023-10-09 NOTE — Telephone Encounter (Signed)
 Notified patient, patient assist is ready for pick up.

## 2023-10-09 NOTE — Telephone Encounter (Signed)
 Patient picked up patient assistance

## 2023-10-10 ENCOUNTER — Telehealth: Payer: Self-pay | Admitting: Neurology

## 2023-10-10 NOTE — Telephone Encounter (Signed)
 no auth required sent to [employer] [phone]

## 2023-10-18 ENCOUNTER — Ambulatory Visit: Admitting: Family Medicine

## 2023-10-26 ENCOUNTER — Ambulatory Visit (HOSPITAL_BASED_OUTPATIENT_CLINIC_OR_DEPARTMENT_OTHER)
Admission: RE | Admit: 2023-10-26 | Discharge: 2023-10-26 | Disposition: A | Discharge status: Home / Self Care | Source: Ambulatory Visit | Attending: Neurology | Admitting: Neurology

## 2023-10-26 DIAGNOSIS — R569 Unspecified convulsions: Secondary | ICD-10-CM | POA: Diagnosis not present

## 2023-10-26 DIAGNOSIS — R413 Other amnesia: Secondary | ICD-10-CM

## 2023-10-26 DIAGNOSIS — Z982 Presence of cerebrospinal fluid drainage device: Secondary | ICD-10-CM | POA: Diagnosis not present

## 2023-10-26 DIAGNOSIS — R930 Abnormal findings on diagnostic imaging of skull and head, not elsewhere classified: Secondary | ICD-10-CM | POA: Diagnosis not present

## 2023-10-26 DIAGNOSIS — Q039 Congenital hydrocephalus, unspecified: Secondary | ICD-10-CM

## 2023-10-31 ENCOUNTER — Ambulatory Visit: Payer: Self-pay | Admitting: Neurology

## 2023-12-18 NOTE — Progress Notes (Unsigned)
 Subjective:  Patient ID: [name], male    DOB: 17-Dec-1965  Age: 58 y.o. MRN: [id number]  Chief Complaint  Patient presents with   Medical Management of Chronic Issues    HPI: Discussed the use of AI scribe software for clinical note transcription with the patient, who gave verbal consent to proceed.  History of Present Illness [name] is a 58 year old male who presents for a routine office visit.  Hypertension - Blood pressure stable at home, ranging from 135/71 to occasionally 150/160 mmHg - No dizziness or lightheadedness  Weight gain - Weight increased from 338 pounds in December to 350 pounds currently - Attributes weight gain to changes in eating habits - Continues to walk and work out at [employer] - Clothes are fitting well  Cognitive impairment - Memory has worsened slightly - Difficulty remembering tasks and appointments - Uses notes for reminders but often forgets to write or check them  Seizure disorder - No recent seizures - No recent fevers, chills, dizziness, or off-balance sensations  Restless legs syndrome - Takes Keppra  and pramipexole  for management  Diabetes mellitus - Takes Rybelsus  14 mg daily for glycemic control  Bladder dysfunction - Takes Vesicare for bladder issues  Depression and anxiety - Takes Effexor  75 mg three times daily for mood management  Medication management - On fludrocortisone and hydrocortisone for chronic therapy - Caregiver assists with filling pill boxes every other week       12/19/2023   10:26 AM 04/17/2023    9:44 AM 10/19/2022    9:17 AM 04/17/2022    9:28 AM 01/11/2022    4:54 PM  Depression screen PHQ 2/9  Decreased Interest 0 0 0 0 2  Down, Depressed, Hopeless 0 0 0 0 0  PHQ - 2 Score 0 0 0 0 2  Altered sleeping 0 0 0 0 1  Tired, decreased energy 1 0 1 0 0  Change in appetite 2 0 3 1 1   Feeling bad or failure about yourself  0 0 0 0 0  Trouble concentrating 0 0 0 0 0  Moving slowly or  fidgety/restless 0 0 0 0 0  Suicidal thoughts 0 0 0 0 0  PHQ-9 Score 3 0  4  1  4    Difficult doing work/chores Not difficult at all Not difficult at all Not difficult at all Not difficult at all Not difficult at all     Data saved with a previous flowsheet row definition        12/19/2023   10:31 AM  Fall Risk   Falls in the past year? 0  Number falls in past yr: 0  Injury with Fall? 0  Risk for fall due to : No Fall Risks  Follow up Falls evaluation completed    Patient Care Team: Sherre Clapper, MD as PCP - General (Family Medicine) Jenel Carlin POUR, MD (Inactive) as Consulting Physician (Neurology) Joshua, Lionel Singh, PA-C (Internal Medicine) Gregg Lek, MD as Consulting Physician (Neurology) Alvaro Ricardo KATHEE Raddle., MD as Consulting Physician (Urology) Misenheimer, Evalene, MD as Consulting Physician (Unknown Physician Specialty)   Review of Systems  Constitutional:  Negative for chills, fatigue, fever and unexpected weight change.  HENT:  Negative for congestion, ear pain, sinus pain and sore throat.   Respiratory:  Negative for cough and shortness of breath.   Cardiovascular:  Negative for chest pain and palpitations.  Gastrointestinal:  Negative for abdominal pain, blood in stool, constipation, diarrhea, nausea  and vomiting.  Endocrine: Negative for polydipsia.  Genitourinary:  Negative for dysuria.  Musculoskeletal:  Negative for back pain.  Skin:  Negative for rash.  Neurological:  Negative for headaches.    Current Outpatient Medications on File Prior to Visit  Medication Sig Dispense Refill   amLODipine  (NORVASC ) 10 MG tablet Take 1 tablet by mouth once daily 90 tablet 1   donepezil  (ARICEPT ) 10 MG tablet TAKE 1 TABLET BY MOUTH AT BEDTIME 90 tablet 3   finasteride (PROSCAR) 5 MG tablet Take 5 mg by mouth daily.     fludrocortisone (FLORINEF) 0.1 MG tablet Take 0.2 mg by mouth 2 (two) times daily.     hydrocortisone (CORTEF) 10 MG tablet Take 10 mg by mouth  daily.     levETIRAcetam  (KEPPRA ) 1000 MG tablet Take 1 tablet (1,000 mg total) by mouth 2 (two) times daily. 180 tablet 4   Melatonin 5 MG TABS Take by mouth at bedtime.     memantine  (NAMENDA ) 10 MG tablet Take 1 tablet (10 mg total) by mouth 2 (two) times daily. 180 tablet 3   pramipexole  (MIRAPEX ) 0.125 MG tablet Take 1 tablet (0.125 mg total) by mouth at bedtime. 90 tablet 3   SODIUM CHLORIDE  PO Take 1,000 mg by mouth in the morning, at noon, and at bedtime.     solifenacin (VESICARE) 10 MG tablet Take 10 mg by mouth daily.     valsartan -hydrochlorothiazide  (DIOVAN -HCT) 320-25 MG tablet Take 1 tablet by mouth once daily 90 tablet 1   venlafaxine  XR (EFFEXOR -XR) 75 MG 24 hr capsule TAKE 3 CAPSULES BY MOUTH ONCE DAILY 270 capsule 0   Semaglutide  14 MG TABS Take 1 tablet (14 mg total) by mouth daily.     No current facility-administered medications on file prior to visit.   Past Medical History:  Diagnosis Date   Anxiety    Aqueductal stenosis (HCC)    Atrial fibrillation (HCC)    Cardiomyopathy    Carpal tunnel syndrome    Chiari I malformation (HCC)    Coronary artery disease    Depression    Headache(784.0)    Hydrocephalus (HCC)    Hypertension    Memory loss    Obstructive sleep apnea    Personal history of urinary calculi 01/03/2012   Pulmonary embolism (HCC)    Renal calculi    Restless leg syndrome    RLS (restless legs syndrome) 05/04/2020   Salt-wasting syndrome of infancy    Seizure (HCC)    SIADH (syndrome of inappropriate ADH production)    Syncope    Recurrent, thought to be neurally mediated   Past Surgical History:  Procedure Laterality Date   CHOLECYSTECTOMY     LOOP RECORDER EXPLANT N/A 10/30/2012   Procedure: LOOP RECORDER EXPLANT;  Surgeon: Elspeth JAYSON Sage, MD;  Location: [facility] CATH LAB;  Service: Cardiovascular;  Laterality: N/A;   Nissen Fundoplasty     PROSTATE SURGERY     [employer] DM [phone] loop     S/p loop recorder   VENTRICULOPERITONEAL SHUNT  Bilateral     Family History  Problem Relation Age of Onset   Migraines Mother    CAD Mother    Hypertension Mother    Endometriosis Sister    Seizures Neg Hx    Social History   Socioeconomic History   Marital status: Widowed    Spouse name: Carleen   Number of children: 0   Years of education: 12   Highest education level: Not on  file  Occupational History   Occupation: CUSTOMER SERVICE    Employer: [id number]    Comment: Not working   Tobacco Use   Smoking status: Never   Smokeless tobacco: Never  Vaping Use   Vaping status: Never Used  Substance and Sexual Activity   Alcohol use: No    Alcohol/week: 0.0 standard drinks of alcohol   Drug use: No   Sexual activity: Not Currently  Other Topics Concern   Not on file  Social History Narrative   Patient's wife passed away, no children   Patient is right handed   Education level is high school graduate   Drinks caffeine free drinks   Social Drivers of Corporate Investment Banker Strain: Low Risk  (04/17/2023)   Overall Financial Resource Strain (CARDIA)    Difficulty of Paying Living Expenses: Not hard at all  Food Insecurity: No Food Insecurity (04/17/2023)   Hunger Vital Sign    Worried About Running Out of Food in the Last Year: Never true    Ran Out of Food in the Last Year: Never true  Transportation Needs: No Transportation Needs (04/17/2023)   PRAPARE - Administrator, Civil Service (Medical): No    Lack of Transportation (Non-Medical): No  Physical Activity: Insufficiently Active (04/17/2023)   Exercise Vital Sign    Days of Exercise per Week: 4 days    Minutes of Exercise per Session: 30 min  Stress: No Stress Concern Present (04/17/2023)   [employer] of Occupational Health - Occupational Stress Questionnaire    Feeling of Stress : Not at all  Social Connections: Socially Isolated (04/17/2023)   Social Connection and Isolation Panel    Frequency of Communication with Friends and Family:  Three times a week    Frequency of Social Gatherings with Friends and Family: Three times a week    Attends Religious Services: Never    Active Member of Clubs or Organizations: No    Attends Banker Meetings: Never    Marital Status: Divorced    Objective:  BP 100/70   Pulse 67   Temp (!) 97.3 F (36.3 C)   Resp 16   Ht 6' 3 (1.905 m)   Wt (!) 350 lb (158.8 kg)   SpO2 96%   BMI 43.75 kg/m      12/19/2023   10:22 AM 10/09/2023    8:54 AM 04/17/2023    9:43 AM  BP/Weight  Systolic BP 100 116 110  Diastolic BP 70 81 72  Wt. (Lbs) 350 348.6 343  BMI 43.75 kg/m2 43.57 kg/m2 41.75 kg/m2    Physical Exam Vitals reviewed.  Constitutional:      Appearance: Normal appearance.  Neck:     Vascular: No carotid bruit.  Cardiovascular:     Rate and Rhythm: Normal rate and regular rhythm.     Pulses: Normal pulses.     Heart sounds: Normal heart sounds.  Pulmonary:     Effort: Pulmonary effort is normal.     Breath sounds: Normal breath sounds. No wheezing, rhonchi or rales.  Abdominal:     General: Bowel sounds are normal.     Palpations: Abdomen is soft.     Tenderness: There is no abdominal tenderness.  Neurological:     Mental Status: He is alert and oriented to person, place, and time.  Psychiatric:        Mood and Affect: Mood normal.        Behavior: Behavior normal.     {  Perform Simple Foot Exam  Perform Detailed exam:1} Diabetic foot exam was performed with the following findings:   Normal sensation of 10g monofilament Intact posterior tibialis and dorsalis pedis pulses Thickened nails.       Lab Results  Component Value Date   WBC 9.5 12/19/2023   HGB 15.8 12/19/2023   HCT 49.0 12/19/2023   PLT 397 12/19/2023   GLUCOSE 93 12/19/2023   CHOL 144 12/19/2023   TRIG 144 12/19/2023   HDL 39 (L) 12/19/2023   LDLCALC 80 12/19/2023   ALT 22 12/19/2023   AST 22 12/19/2023   NA 132 (L) 12/19/2023   K 4.2 12/19/2023   CL 95 (L) 12/19/2023    CREATININE 0.76 12/19/2023   BUN 11 12/19/2023   CO2 23 12/19/2023   TSH 2.840 10/19/2022   INR 1.02 08/07/2011   HGBA1C 6.5 (H) 12/19/2023    Results for orders placed or performed in visit on 12/19/23  CBC with Differential/Platelet   Collection Time: 12/19/23 11:26 AM  Result Value Ref Range   WBC 9.5 3.4 - 10.8 x10E3/uL   RBC 5.83 (H) 4.14 - 5.80 x10E6/uL   Hemoglobin 15.8 13.0 - 17.7 g/dL   Hematocrit 50.9 62.4 - 51.0 %   MCV 84 79 - 97 fL   MCH 27.1 26.6 - 33.0 pg   MCHC 32.2 31.5 - 35.7 g/dL   RDW 85.9 88.3 - 84.5 %   Platelets 397 150 - 450 x10E3/uL   Neutrophils 69 Not Estab. %   Lymphs 17 Not Estab. %   Monocytes 11 Not Estab. %   Eos 1 Not Estab. %   Basos 1 Not Estab. %   Neutrophils Absolute 6.6 1.4 - 7.0 x10E3/uL   Lymphocytes Absolute 1.6 0.7 - 3.1 x10E3/uL   Monocytes Absolute 1.0 (H) 0.1 - 0.9 x10E3/uL   EOS (ABSOLUTE) 0.1 0.0 - 0.4 x10E3/uL   Basophils Absolute 0.1 0.0 - 0.2 x10E3/uL   Immature Granulocytes 1 Not Estab. %   Immature Grans (Abs) 0.1 0.0 - 0.1 x10E3/uL  Lipid panel   Collection Time: 12/19/23 11:26 AM  Result Value Ref Range   Cholesterol, Total 144 100 - 199 mg/dL   Triglycerides 855 0 - 149 mg/dL   HDL 39 (L) >60 mg/dL   VLDL Cholesterol Cal 25 5 - 40 mg/dL   LDL Chol Calc (NIH) 80 0 - 99 mg/dL   Chol/HDL Ratio 3.7 0.0 - 5.0 ratio  Hemoglobin A1c   Collection Time: 12/19/23 11:26 AM  Result Value Ref Range   Hgb A1c MFr Bld 6.5 (H) 4.8 - 5.6 %   Est. average glucose Bld gHb Est-mCnc 140 mg/dL  Comprehensive metabolic panel with GFR   Collection Time: 12/19/23 11:26 AM  Result Value Ref Range   Glucose 93 70 - 99 mg/dL   BUN 11 6 - 24 mg/dL   Creatinine, Ser 9.23 0.76 - 1.27 mg/dL   eGFR 895 >40 fO/fpw/8.26   BUN/Creatinine Ratio 14 9 - 20   Sodium 132 (L) 134 - 144 mmol/L   Potassium 4.2 3.5 - 5.2 mmol/L   Chloride 95 (L) 96 - 106 mmol/L   CO2 23 20 - 29 mmol/L   Calcium 9.1 8.7 - 10.2 mg/dL   Total Protein 7.1 6.0 - 8.5  g/dL   Albumin 4.1 3.8 - 4.9 g/dL   Globulin, Total 3.0 1.5 - 4.5 g/dL   Bilirubin Total 1.0 0.0 - 1.2 mg/dL   Alkaline Phosphatase 83 47 - 123  IU/L   AST 22 0 - 40 IU/L   ALT 22 0 - 44 IU/L  Microalbumin / creatinine urine ratio   Collection Time: 12/19/23 11:26 AM  Result Value Ref Range   Creatinine, Urine 70.6 Not Estab. mg/dL   Microalbumin, Urine 86.7 Not Estab. ug/mL   Microalb/Creat Ratio 19 0 - 29 mg/g creat  .  Assessment & Plan:   Assessment & Plan Type 2 diabetes mellitus without complication, without long-term current use of insulin (HCC) Managed with Rybelsus  14 mg daily. Discussed patient assistance for medication coverage. - Continue Rybelsus  14 mg daily. - Check insurance coverage for Rybelsus  when supply runs out. Orders:   Lipid panel   Hemoglobin A1c   Microalbumin / creatinine urine ratio  Essential hypertension Blood pressure controlled at 100/70 mmHg. No symptoms of hypotension. Weight gain noted. - Decreased amlodipine  to 5 mg daily. 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 Current medication at maximum dose. - Encouraged use of written notes for reminders.    Seizures (HCC) Managed with levetiracetam  (Keppra ). No recent seizures.     Adrenal insufficiency Long-term management with fludrocortisone and hydrocortisone.    Depression, major, recurrent, mild Managed with venlafaxine  75 mg three times daily.    Encounter for immunization  Orders:   Flu vaccine, recombinant, trivalent, inj  Encounter for immunization  Orders:   Pneumococcal conjugate vaccine 20-valent    Body mass index is 43.75 kg/m.    No orders of the defined types were placed in this encounter.   Orders Placed This Encounter  Procedures   Flu vaccine, recombinant, trivalent, inj   Pneumococcal conjugate vaccine 20-valent   CBC with  Differential/Platelet   Lipid panel   Hemoglobin A1c   Comprehensive metabolic panel with GFR   Microalbumin / creatinine urine ratio     I,Marla I Leal-Borjas,acting as a scribe for Abigail Free, MD.,have documented all relevant documentation on the behalf of Abigail Free, MD,as directed by  Abigail Free, MD while in the presence of Abigail Free, MD.   Follow-up: Return in about 6 months (around 06/17/2024) for chronic follow up. NEEDS [facility] IN PERSON IN [place]. .  An After Visit Summary was printed and given to the patient.  Abigail Free, MD [facility] [phone]

## 2023-12-19 ENCOUNTER — Encounter: Payer: Self-pay | Admitting: Family Medicine

## 2023-12-19 ENCOUNTER — Ambulatory Visit (INDEPENDENT_AMBULATORY_CARE_PROVIDER_SITE_OTHER): Admitting: Family Medicine

## 2023-12-19 VITALS — BP 100/70 | HR 67 | Temp 97.3°F | Resp 16 | Ht 75.0 in | Wt 350.0 lb

## 2023-12-19 DIAGNOSIS — E119 Type 2 diabetes mellitus without complications: Secondary | ICD-10-CM | POA: Diagnosis not present

## 2023-12-19 DIAGNOSIS — E2740 Unspecified adrenocortical insufficiency: Secondary | ICD-10-CM

## 2023-12-19 DIAGNOSIS — F0280 Dementia in other diseases classified elsewhere without behavioral disturbance: Secondary | ICD-10-CM

## 2023-12-19 DIAGNOSIS — F330 Major depressive disorder, recurrent, mild: Secondary | ICD-10-CM | POA: Diagnosis not present

## 2023-12-19 DIAGNOSIS — Z23 Encounter for immunization: Secondary | ICD-10-CM

## 2023-12-19 DIAGNOSIS — R569 Unspecified convulsions: Secondary | ICD-10-CM | POA: Diagnosis not present

## 2023-12-19 DIAGNOSIS — I10 Essential (primary) hypertension: Secondary | ICD-10-CM | POA: Diagnosis not present

## 2023-12-19 NOTE — Patient Instructions (Addendum)
  VISIT SUMMARY: Today, you came in for a routine visit. We discussed your blood pressure, weight gain, memory issues, diabetes, and other ongoing health concerns. We made some adjustments to your medications and scheduled some important follow-up appointments.  YOUR PLAN: ESSENTIAL HYPERTENSION: Your blood pressure is well-controlled, but we noted some weight gain. -Decrease amlodipine  to 5 mg daily (you may cut your 10 mg in half.)  DEMENTIA: You have experienced some worsening of your memory issues. -Continue using written notes for reminders.  TYPE 2 DIABETES MELLITUS: Your diabetes is managed with Rybelsus  14 mg daily. -Continue Rybelsus  14 mg daily. -Check insurance coverage for Rybelsus  when your supply runs out.  ADRENOCORTICAL INSUFFICIENCY: You are on long-term management with fludrocortisone and hydrocortisone. -Continue current medications as prescribed.  DEPRESSION AND ANXIETY: Your mood is managed with venlafaxine  75 mg three pills daily. -Continue current medications as prescribed.  GENERAL HEALTH MAINTENANCE: You need routine health maintenance. -Schedule an annual eye exam. -Schedule a Medicare annual wellness visit.

## 2023-12-19 NOTE — Assessment & Plan Note (Addendum)
 Blood pressure controlled at 100/70 mmHg. No symptoms of hypotension. Weight gain noted. - Decreased amlodipine  to 5 mg daily. Orders:   CBC with Differential/Platelet   Comprehensive metabolic panel with GFR

## 2023-12-19 NOTE — Assessment & Plan Note (Addendum)
 Managed with Rybelsus  14 mg daily. Discussed patient assistance for medication coverage. - Continue Rybelsus  14 mg daily. - Check insurance coverage for Rybelsus  when supply runs out. Orders:   Lipid panel   Hemoglobin A1c   Microalbumin / creatinine urine ratio

## 2023-12-19 NOTE — Assessment & Plan Note (Addendum)
 Managed with levetiracetam (Keppra). No recent seizures.

## 2023-12-19 NOTE — Assessment & Plan Note (Addendum)
 Memory issues slightly worsened. Current medication at maximum dose. - Encouraged use of written notes for reminders.

## 2023-12-19 NOTE — Assessment & Plan Note (Addendum)
 Long-term management with fludrocortisone and hydrocortisone.

## 2023-12-21 LAB — CBC WITH DIFFERENTIAL/PLATELET
Basophils Absolute: 0.1 x10E3/uL (ref 0.0–0.2)
Basos: 1 %
EOS (ABSOLUTE): 0.1 x10E3/uL (ref 0.0–0.4)
Eos: 1 %
Hematocrit: 49 % (ref 37.5–51.0)
Hemoglobin: 15.8 g/dL (ref 13.0–17.7)
Immature Grans (Abs): 0.1 x10E3/uL (ref 0.0–0.1)
Immature Granulocytes: 1 %
Lymphocytes Absolute: 1.6 x10E3/uL (ref 0.7–3.1)
Lymphs: 17 %
MCH: 27.1 pg (ref 26.6–33.0)
MCHC: 32.2 g/dL (ref 31.5–35.7)
MCV: 84 fL (ref 79–97)
Monocytes Absolute: 1 x10E3/uL — ABNORMAL HIGH (ref 0.1–0.9)
Monocytes: 11 %
Neutrophils Absolute: 6.6 x10E3/uL (ref 1.4–7.0)
Neutrophils: 69 %
Platelets: 397 x10E3/uL (ref 150–450)
RBC: 5.83 x10E6/uL — ABNORMAL HIGH (ref 4.14–5.80)
RDW: 14 % (ref 11.6–15.4)
WBC: 9.5 x10E3/uL (ref 3.4–10.8)

## 2023-12-21 LAB — MICROALBUMIN / CREATININE URINE RATIO
Creatinine, Urine: 70.6 mg/dL
Microalb/Creat Ratio: 19 mg/g{creat} (ref 0–29)
Microalbumin, Urine: 13.2 ug/mL

## 2023-12-21 LAB — COMPREHENSIVE METABOLIC PANEL WITH GFR
ALT: 22 IU/L (ref 0–44)
AST: 22 IU/L (ref 0–40)
Albumin: 4.1 g/dL (ref 3.8–4.9)
Alkaline Phosphatase: 83 IU/L (ref 47–123)
BUN/Creatinine Ratio: 14 (ref 9–20)
BUN: 11 mg/dL (ref 6–24)
Bilirubin Total: 1 mg/dL (ref 0.0–1.2)
CO2: 23 mmol/L (ref 20–29)
Calcium: 9.1 mg/dL (ref 8.7–10.2)
Chloride: 95 mmol/L — ABNORMAL LOW (ref 96–106)
Creatinine, Ser: 0.76 mg/dL (ref 0.76–1.27)
Globulin, Total: 3 g/dL (ref 1.5–4.5)
Glucose: 93 mg/dL (ref 70–99)
Potassium: 4.2 mmol/L (ref 3.5–5.2)
Sodium: 132 mmol/L — ABNORMAL LOW (ref 134–144)
Total Protein: 7.1 g/dL (ref 6.0–8.5)
eGFR: 104 mL/min/1.73 (ref >59)

## 2023-12-21 LAB — HEMOGLOBIN A1C
Est. average glucose Bld gHb Est-mCnc: 140 mg/dL
Hgb A1c MFr Bld: 6.5 % — ABNORMAL HIGH (ref 4.8–5.6)

## 2023-12-21 LAB — LIPID PANEL
Chol/HDL Ratio: 3.7 ratio (ref 0.0–5.0)
Cholesterol, Total: 144 mg/dL (ref 100–199)
HDL: 39 mg/dL — ABNORMAL LOW (ref >39)
LDL Chol Calc (NIH): 80 mg/dL (ref 0–99)
Triglycerides: 144 mg/dL (ref 0–149)
VLDL Cholesterol Cal: 25 mg/dL (ref 5–40)

## 2023-12-21 NOTE — Assessment & Plan Note (Signed)
 Managed with venlafaxine  75 mg three times daily.

## 2023-12-23 ENCOUNTER — Ambulatory Visit: Payer: Self-pay | Admitting: Family Medicine

## 2023-12-23 NOTE — Assessment & Plan Note (Signed)
 Recommend continue to work on eating healthy diet and exercise.

## 2023-12-29 ENCOUNTER — Other Ambulatory Visit: Payer: Self-pay | Admitting: Family Medicine

## 2023-12-29 DIAGNOSIS — I10 Essential (primary) hypertension: Secondary | ICD-10-CM

## 2024-01-09 NOTE — Progress Notes (Signed)
 [name]                                          MRN: [id number]   01/09/2024   The [facility] Quality Team Specialist reviewed this patient medical record for the purposes of chart review for care gap closure. The following were reviewed: abstraction for care gap closure-kidney health evaluation for diabetes:eGFR  and uACR.    [facility]BCI Quality Team

## 2024-01-12 ENCOUNTER — Other Ambulatory Visit: Payer: Self-pay | Admitting: Neurology

## 2024-01-22 ENCOUNTER — Other Ambulatory Visit: Payer: Self-pay | Admitting: Family Medicine

## 2024-01-22 ENCOUNTER — Ambulatory Visit (INDEPENDENT_AMBULATORY_CARE_PROVIDER_SITE_OTHER)

## 2024-01-22 VITALS — BP 124/86 | HR 89 | Temp 98.0°F | Ht 75.0 in | Wt 354.2 lb

## 2024-01-22 DIAGNOSIS — Z0000 Encounter for general adult medical examination without abnormal findings: Secondary | ICD-10-CM

## 2024-01-22 MED ORDER — PHENTERMINE HCL 37.5 MG PO TABS: 37.5000 mg | ORAL_TABLET | Freq: Every day | ORAL | 1 refills | Status: AC

## 2024-01-22 NOTE — Progress Notes (Unsigned)
phenterm

## 2024-01-22 NOTE — Patient Instructions (Signed)
 [name] , Thank you for taking time to come for your Medicare Wellness Visit. I appreciate your ongoing commitment to your health goals. Please review the following plan we discussed and let me know if I can assist you in the future.   These are the goals we discussed:  Goals       Weight (lb) < 300 lb (136.1 kg)      Patient states he would like to lose weight.      Weight (lb) < 300 lb (136.1 kg) (pt-stated)      Patient states he would like to lose weight.         This is a list of the screening recommended for you and due dates:  Health Maintenance  Topic Date Due   Eye exam for diabetics  Never done   Hepatitis B Vaccine (1 of 3 - 19+ 3-dose series) Never done   Zoster (Shingles) Vaccine (2 of 2) 11/23/2021   COVID-19 Vaccine (6 - 2025-26 season) 09/24/2023   Hemoglobin A1C  06/17/2024   Yearly kidney function blood test for diabetes  12/18/2024   Yearly kidney health urinalysis for diabetes  12/18/2024   Complete foot exam   12/18/2024   Medicare Annual Wellness Visit  01/21/2025   Colon Cancer Screening  02/16/2027   DTaP/Tdap/Td vaccine (2 - Td or Tdap) 09/29/2031   Pneumococcal Vaccine for age over 4  Completed   Flu Shot  Completed   Hepatitis C Screening  Completed   HIV Screening  Completed   HPV Vaccine  Aged Out   Meningitis B Vaccine  Aged Out    Advanced directives: No  Next appointment: Follow up in one year for your annual wellness visit: would like to wait to make appointment.   Preventive Care 40-64 Years, Male Preventive care refers to lifestyle choices and visits with your health care provider that can promote health and wellness. What does preventive care include? A yearly physical exam. This is also called an annual well check. Dental exams once or twice a year. Routine eye exams. Ask your health care provider how often you should have your eyes checked. Personal lifestyle choices, including: Daily care of your teeth and gums. Regular  physical activity. Eating a healthy diet. Avoiding tobacco and drug use. Limiting alcohol use. Practicing safe sex. Taking low-dose aspirin every day starting at age 7. What happens during an annual well check? The services and screenings done by your health care provider during your annual well check will depend on your age, overall health, lifestyle risk factors, and family history of disease. Counseling  Your health care provider may ask you questions about your: Alcohol use. Tobacco use. Drug use. Emotional well-being. Home and relationship well-being. Sexual activity. Eating habits. Work and work astronomer. Screening  You may have the following tests or measurements: Height, weight, and BMI. Blood pressure. Lipid and cholesterol levels. These may be checked every 5 years, or more frequently if you are over 44 years old. Skin check. Lung cancer screening. You may have this screening every year starting at age 37 if you have a 30-pack-year history of smoking and currently smoke or have quit within the past 15 years. Fecal occult blood test (FOBT) of the stool. You may have this test every year starting at age 8. Flexible sigmoidoscopy or colonoscopy. You may have a sigmoidoscopy every 5 years or a colonoscopy every 10 years starting at age 21. Prostate cancer screening. Recommendations will vary depending on your  family history and other risks. Hepatitis C blood test. Hepatitis B blood test. Sexually transmitted disease (STD) testing. Diabetes screening. This is done by checking your blood sugar (glucose) after you have not eaten for a while (fasting). You may have this done every 1-3 years. Discuss your test results, treatment options, and if necessary, the need for more tests with your health care provider. Vaccines  Your health care provider may recommend certain vaccines, such as: Influenza vaccine. This is recommended every year. Tetanus, diphtheria, and acellular  pertussis (Tdap, Td) vaccine. You may need a Td booster every 10 years. Zoster vaccine. You may need this after age 67. Pneumococcal 13-valent conjugate (PCV13) vaccine. You may need this if you have certain conditions and have not been vaccinated. Pneumococcal polysaccharide (PPSV23) vaccine. You may need one or two doses if you smoke cigarettes or if you have certain conditions. Talk to your health care provider about which screenings and vaccines you need and how often you need them. This information is not intended to replace advice given to you by your health care provider. Make sure you discuss any questions you have with your health care provider. Document Released: 02/05/2015 Document Revised: 09/29/2015 Document Reviewed: 11/10/2014 Elsevier Interactive Patient Education  2017 [employer].  Fall Prevention in the Home Falls can cause injuries. They can happen to people of all ages. There are many things you can do to make your home safe and to help prevent falls. What can I do on the outside of my home? Regularly fix the edges of walkways and driveways and fix any cracks. Remove anything that might make you trip as you walk through a door, such as a raised step or threshold. Trim any bushes or trees on the path to your home. Use bright outdoor lighting. Clear any walking paths of anything that might make someone trip, such as rocks or tools. Regularly check to see if handrails are loose or broken. Make sure that both sides of any steps have handrails. Any raised decks and porches should have guardrails on the edges. Have any leaves, snow, or ice cleared regularly. Use sand or salt on walking paths during winter. Clean up any spills in your garage right away. This includes oil or grease spills. What can I do in the bathroom? Use night lights. Install grab bars by the toilet and in the tub and shower. Do not use towel bars as grab bars. Use non-skid mats or decals in the tub or  shower. If you need to sit down in the shower, use a plastic, non-slip stool. Keep the floor dry. Clean up any water that spills on the floor as soon as it happens. Remove soap buildup in the tub or shower regularly. Attach bath mats securely with double-sided non-slip rug tape. Do not have throw rugs and other things on the floor that can make you trip. What can I do in the bedroom? Use night lights. Make sure that you have a light by your bed that is easy to reach. Do not use any sheets or blankets that are too big for your bed. They should not hang down onto the floor. Have a firm chair that has side arms. You can use this for support while you get dressed. Do not have throw rugs and other things on the floor that can make you trip. What can I do in the kitchen? Clean up any spills right away. Avoid walking on wet floors. Keep items that you use a lot  in easy-to-reach places. If you need to reach something above you, use a strong step stool that has a grab bar. Keep electrical cords out of the way. Do not use floor polish or wax that makes floors slippery. If you must use wax, use non-skid floor wax. Do not have throw rugs and other things on the floor that can make you trip. What can I do with my stairs? Do not leave any items on the stairs. Make sure that there are handrails on both sides of the stairs and use them. Fix handrails that are broken or loose. Make sure that handrails are as long as the stairways. Check any carpeting to make sure that it is firmly attached to the stairs. Fix any carpet that is loose or worn. Avoid having throw rugs at the top or bottom of the stairs. If you do have throw rugs, attach them to the floor with carpet tape. Make sure that you have a light switch at the top of the stairs and the bottom of the stairs. If you do not have them, ask someone to add them for you. What else can I do to help prevent falls? Wear shoes that: Do not have high heels. Have  rubber bottoms. Are comfortable and fit you well. Are closed at the toe. Do not wear sandals. If you use a stepladder: Make sure that it is fully opened. Do not climb a closed stepladder. Make sure that both sides of the stepladder are locked into place. Ask someone to hold it for you, if possible. Clearly mark and make sure that you can see: Any grab bars or handrails. First and last steps. Where the edge of each step is. Use tools that help you move around (mobility aids) if they are needed. These include: Canes. Walkers. Scooters. Crutches. Turn on the lights when you go into a dark area. Replace any light bulbs as soon as they burn out. Set up your furniture so you have a clear path. Avoid moving your furniture around. If any of your floors are uneven, fix them. If there are any pets around you, be aware of where they are. Review your medicines with your doctor. Some medicines can make you feel dizzy. This can increase your chance of falling. Ask your doctor what other things that you can do to help prevent falls. This information is not intended to replace advice given to you by your health care provider. Make sure you discuss any questions you have with your health care provider. Document Released: 11/05/2008 Document Revised: 06/17/2015 Document Reviewed: 02/13/2014 Elsevier Interactive Patient Education  2017 [employer].

## 2024-01-22 NOTE — Progress Notes (Signed)
 "  Chief Complaint  Patient presents with   Annual Exam     Subjective:   [name] is a 58 y.o. male who presents for a Medicare Annual Wellness Visit.  Visit info / Clinical Intake: Medicare Wellness Visit Type:: Subsequent Annual Wellness Visit Persons participating in visit and providing information:: patient & caregiver Medicare Wellness Visit Mode:: In-person (required for WTM) Interpreter Needed?: No Pre-visit prep was completed: no AWV questionnaire completed by patient prior to visit?: no Living arrangements:: (!) lives alone Patient's Overall Health Status Rating: good Typical amount of pain: none Does pain affect daily life?: no Are you currently prescribed opioids?: no  Dietary Habits and Nutritional Risks How many meals a day?: 3 Eats fruit and vegetables daily?: yes Most meals are obtained by: preparing own meals; eating out; having others provide food In the last 2 weeks, have you had any of the following?: none Diabetic:: no  Functional Status Activities of Daily Living (to include ambulation/medication): Independent Ambulation: Independent Medication Administration: Needs assistance (comment) (dad does for patient) Home Management (perform basic housework or laundry): Independent Manage your own finances?: (!) no (dad does for patient) Primary transportation is: family / friends Concerns about vision?: no *vision screening is required for WTM* Concerns about hearing?: no  Fall Screening Falls in the past year?: 0 Number of falls in past year: 0 Was there an injury with Fall?: 0 Fall Risk Category Calculator: 0 Patient Fall Risk Level: Low Fall Risk  Fall Risk Patient at Risk for Falls Due to: No Fall Risks Fall risk Follow up: Falls evaluation completed  Home and Transportation Safety: All rugs have non-skid backing?: N/A, no rugs All stairs or steps have railings?: N/A, no stairs Grab bars in the bathtub or shower?: yes Have non-skid  surface in bathtub or shower?: yes Good home lighting?: yes Regular seat belt use?: yes Hospital stays in the last year:: no  Cognitive Assessment Difficulty concentrating, remembering, or making decisions? : yes Will 6CIT or Mini Cog be Completed: yes What year is it?: 0 points What month is it?: 0 points Give patient an address phrase to remember (5 components): [address] [place] what time is it?: 0 points Count backwards from 20 to 1: 0 points Say the months of the year in reverse: 0 points Repeat the address phrase from earlier: 2 points 6 CIT Score: 2 points  Advance Directives (For Healthcare) Does Patient Have a Medical Advance Directive?: No Would patient like information on creating a medical advance directive?: No - Patient declined  Reviewed/Updated  Reviewed/Updated: Reviewed All (Medical, Surgical, Family, Medications, Allergies, Care Teams, Patient Goals)    Allergies (verified) Omnipaque  [iohexol ] and Prednisone   Current Medications (verified) Outpatient Encounter Medications as of 01/22/2024  Medication Sig   amLODipine  (NORVASC ) 10 MG tablet Take 1 tablet by mouth once daily   donepezil  (ARICEPT ) 10 MG tablet TAKE 1 TABLET BY MOUTH AT BEDTIME   finasteride (PROSCAR) 5 MG tablet Take 5 mg by mouth daily.   fludrocortisone (FLORINEF) 0.1 MG tablet Take 0.2 mg by mouth 2 (two) times daily.   hydrocortisone (CORTEF) 10 MG tablet Take 10 mg by mouth daily.   levETIRAcetam  (KEPPRA ) 1000 MG tablet Take 1 tablet (1,000 mg total) by mouth 2 (two) times daily.   Melatonin 5 MG TABS Take by mouth at bedtime.   memantine  (NAMENDA ) 10 MG tablet Take 1 tablet (10 mg total) by mouth 2 (two) times daily.   pramipexole  (MIRAPEX )  0.125 MG tablet Take 1 tablet by mouth twice daily   Semaglutide  14 MG TABS Take 1 tablet (14 mg total) by mouth daily.   SODIUM CHLORIDE  PO Take 1,000 mg by mouth in the morning, at noon, and at bedtime.   solifenacin (VESICARE) 10 MG  tablet Take 10 mg by mouth daily.   valsartan -hydrochlorothiazide  (DIOVAN -HCT) 320-25 MG tablet Take 1 tablet by mouth once daily   venlafaxine  XR (EFFEXOR -XR) 75 MG 24 hr capsule TAKE 3 CAPSULES BY MOUTH ONCE DAILY   No facility-administered encounter medications on file as of 01/22/2024.    History: Past Medical History:  Diagnosis Date   Anxiety    Aqueductal stenosis (HCC)    Atrial fibrillation (HCC)    Cardiomyopathy    Carpal tunnel syndrome    Chiari I malformation (HCC)    Coronary artery disease    Depression    Headache(784.0)    Hydrocephalus (HCC)    Hypertension    Memory loss    Obstructive sleep apnea    Personal history of urinary calculi 01/03/2012   Pulmonary embolism (HCC)    Renal calculi    Restless leg syndrome    RLS (restless legs syndrome) 05/04/2020   Salt-wasting syndrome of infancy    Seizure (HCC)    SIADH (syndrome of inappropriate ADH production)    Syncope    Recurrent, thought to be neurally mediated   Past Surgical History:  Procedure Laterality Date   CHOLECYSTECTOMY     LOOP RECORDER EXPLANT N/A 10/30/2012   Procedure: LOOP RECORDER EXPLANT;  Surgeon: Elspeth JAYSON Sage, MD;  Location: [facility] CATH LAB;  Service: Cardiovascular;  Laterality: N/A;   Nissen Fundoplasty     PROSTATE SURGERY     [employer] DM [phone] loop     S/p loop recorder   VENTRICULOPERITONEAL SHUNT Bilateral    Family History  Problem Relation Age of Onset   Migraines Mother    CAD Mother    Hypertension Mother    Endometriosis Sister    Seizures Neg Hx    Social History   Occupational History   Occupation: FINANCIAL TRADER SERVICE    Employer: [id number]    Comment: Not working   Tobacco Use   Smoking status: Never   Smokeless tobacco: Never  Vaping Use   Vaping status: Never Used  Substance and Sexual Activity   Alcohol use: No    Alcohol/week: 0.0 standard drinks of alcohol   Drug use: No   Sexual activity: Not Currently   Tobacco Counseling Counseling given:  Not Answered  SDOH Screenings   Food Insecurity: No Food Insecurity (01/22/2024)  Housing: Unknown (01/22/2024)  Transportation Needs: No Transportation Needs (01/22/2024)  Utilities: Not At Risk (01/22/2024)  Alcohol Screen: Low Risk (04/17/2023)  Depression (PHQ2-9): Low Risk (01/22/2024)  Financial Resource Strain: Low Risk (04/17/2023)  Physical Activity: Sufficiently Active (01/22/2024)  Social Connections: Socially Isolated (01/22/2024)  Stress: No Stress Concern Present (01/22/2024)  Tobacco Use: Low Risk (01/22/2024)  Health Literacy: Inadequate Health Literacy (01/22/2024)   See flowsheets for full screening details  Depression Screen PHQ 2 & 9 Depression Scale- Over the past 2 weeks, how often have you been bothered by any of the following problems? Little interest or pleasure in doing things: 0 Feeling down, depressed, or hopeless (PHQ Adolescent also includes...irritable): 0 PHQ-2 Total Score: 0 Trouble falling or staying asleep, or sleeping too much: 0 Feeling tired or having little energy: 0 Poor appetite or overeating (PHQ Adolescent also includes...weight loss):  0 Feeling bad about yourself - or that you are a failure or have let yourself or your family down: 0 Trouble concentrating on things, such as reading the newspaper or watching television (PHQ Adolescent also includes...like school work): 0 Moving or speaking so slowly that other people could have noticed. Or the opposite - being so fidgety or restless that you have been moving around a lot more than usual: 0 Thoughts that you would be better off dead, or of hurting yourself in some way: 0 PHQ-9 Total Score: 0 If you checked off any problems, how difficult have these problems made it for you to do your work, take care of things at home, or get along with other people?: Not difficult at all     Goals Addressed               This Visit's Progress     Weight (lb) < 300 lb (136.1 kg) (pt-stated)   354 lb  3.2 oz (160.7 kg)     Patient states he would like to lose weight.              Objective:    Today's Vitals   01/22/24 0943  BP: 124/86  Pulse: 89  Temp: 98 F (36.7 C)  SpO2: 97%  Weight: (!) 354 lb 3.2 oz (160.7 kg)  Height: 6' 3 (1.905 m)   Body mass index is 44.27 kg/m.  Hearing/Vision screen No results found. Immunizations and Health Maintenance Health Maintenance  Topic Date Due   OPHTHALMOLOGY EXAM  Never done   Hepatitis B Vaccines 19-59 Average Risk (1 of 3 - 19+ 3-dose series) Never done   Zoster Vaccines- Shingrix (2 of 2) 11/23/2021   COVID-19 Vaccine (6 - 2025-26 season) 09/24/2023   HEMOGLOBIN A1C  06/17/2024   Diabetic kidney evaluation - eGFR measurement  12/18/2024   Diabetic kidney evaluation - Urine ACR  12/18/2024   FOOT EXAM  12/18/2024   Medicare Annual Wellness (AWV)  01/21/2025   Colonoscopy  02/16/2027   DTaP/Tdap/Td (2 - Td or Tdap) 09/29/2031   Pneumococcal Vaccine: 50+ Years  Completed   Influenza Vaccine  Completed   Hepatitis C Screening  Completed   HIV Screening  Completed   HPV VACCINES  Aged Out   Meningococcal B Vaccine  Aged Out        Assessment/Plan:  This is a routine wellness examination for [place].  Patient Care Team: Sherre Clapper, MD as PCP - General (Family Medicine) Jenel Carlin POUR, MD (Inactive) as Consulting Physician (Neurology) Joshua Lionel Singh, PA-C (Internal Medicine) Gregg Lek, MD as Consulting Physician (Neurology) Alvaro Ricardo KATHEE Raddle., MD as Consulting Physician (Urology) Misenheimer, Evalene, MD as Consulting Physician (Unknown Physician Specialty)  I have personally reviewed and noted the following in the patients chart:   Medical and social history Use of alcohol, tobacco or illicit drugs  Current medications and supplements including opioid prescriptions. Functional ability and status Nutritional status Physical activity Advanced directives List of other  physicians Hospitalizations, surgeries, and ER visits in previous 12 months Vitals Screenings to include cognitive, depression, and falls Referrals and appointments  No orders of the defined types were placed in this encounter.  In addition, I have reviewed and discussed with patient certain preventive protocols, quality metrics, and best practice recommendations. A written personalized care plan for preventive services as well as general preventive health recommendations were provided to patient.   [place] Mailman, CMA   01/22/2024   Return in 1  year (on 01/21/2025).  After Visit Summary: (In Person-Printed) AVS printed and given to the patient  Nurse Notes: I Spent 30 minutes with patient and his dad. Patient's father would like to know is there anything affordable patient can take for weight loss. Spoke with Dr. Sherre, she stated she will send in a tablet for patient to take.Spoke with patient and his father, verbalized understanding and had no questions at this time.  "

## 2024-01-29 ENCOUNTER — Telehealth: Payer: Self-pay

## 2024-01-29 NOTE — Telephone Encounter (Signed)
" ° °  Copied from CRM #[id number]. Topic: Clinical - Medication Question >> Jan 29, 2024  2:25 PM Jasmin G wrote: Reason for CRM: Pt's father requested a call back at [phone] to discuss if it's okay for pt to take phentermine  (ADIPEX-P ) 37.5 MG tablet along with another med. "

## 2024-01-30 NOTE — Telephone Encounter (Signed)
 Patient's mother informed

## 2024-01-30 NOTE — Telephone Encounter (Signed)
 Called and spoke to patient dad and he wanted to know was he suppose to keep taking the Semaglutide  14 MG TABS and is he suppose to be taking it along with the phentermine , he stated that patient still has about 5 bottles of the Semaglutide  left. Please advise

## 2024-02-22 ENCOUNTER — Ambulatory Visit

## 2024-03-11 ENCOUNTER — Ambulatory Visit: Admitting: Family Medicine

## 2024-06-18 ENCOUNTER — Ambulatory Visit: Admitting: Family Medicine

## 2024-10-08 ENCOUNTER — Ambulatory Visit: Admitting: Neurology
